# Patient Record
Sex: Female | Born: 1935 | Race: White | Hispanic: No | Marital: Married | Smoking: Never smoker
Health system: Southern US, Community
[De-identification: ages and names within clinical notes are randomized; demographics above are authoritative.]

## PROBLEM LIST (undated history)

## (undated) DIAGNOSIS — N200 Calculus of kidney: Secondary | ICD-10-CM

## (undated) DIAGNOSIS — K5790 Diverticulosis of intestine, part unspecified, without perforation or abscess without bleeding: Secondary | ICD-10-CM

## (undated) DIAGNOSIS — E785 Hyperlipidemia, unspecified: Secondary | ICD-10-CM

## (undated) DIAGNOSIS — M1990 Unspecified osteoarthritis, unspecified site: Secondary | ICD-10-CM

## (undated) DIAGNOSIS — I10 Essential (primary) hypertension: Secondary | ICD-10-CM

## (undated) DIAGNOSIS — R011 Cardiac murmur, unspecified: Secondary | ICD-10-CM

## (undated) DIAGNOSIS — I2510 Atherosclerotic heart disease of native coronary artery without angina pectoris: Secondary | ICD-10-CM

## (undated) DIAGNOSIS — T7840XA Allergy, unspecified, initial encounter: Secondary | ICD-10-CM

## (undated) DIAGNOSIS — K635 Polyp of colon: Secondary | ICD-10-CM

## (undated) DIAGNOSIS — C44519 Basal cell carcinoma of skin of other part of trunk: Secondary | ICD-10-CM

## (undated) HISTORY — DX: Diverticulosis of intestine, part unspecified, without perforation or abscess without bleeding: K57.90

## (undated) HISTORY — PX: OTHER SURGICAL HISTORY: SHX169

## (undated) HISTORY — DX: Unspecified osteoarthritis, unspecified site: M19.90

## (undated) HISTORY — PX: CARDIAC CATHETERIZATION: SHX172

## (undated) HISTORY — DX: Hyperlipidemia, unspecified: E78.5

## (undated) HISTORY — DX: Allergy, unspecified, initial encounter: T78.40XA

## (undated) HISTORY — DX: Essential (primary) hypertension: I10

## (undated) HISTORY — DX: Polyp of colon: K63.5

## (undated) HISTORY — PX: EYE SURGERY: SHX253

## (undated) HISTORY — DX: Calculus of kidney: N20.0

## (undated) HISTORY — DX: Basal cell carcinoma of skin of other part of trunk: C44.519

## (undated) HISTORY — PX: TONSILLECTOMY AND ADENOIDECTOMY: SUR1326

## (undated) HISTORY — DX: Cardiac murmur, unspecified: R01.1

## (undated) HISTORY — PX: DILATION AND CURETTAGE OF UTERUS: SHX78

---

## 1960-08-22 DIAGNOSIS — N200 Calculus of kidney: Secondary | ICD-10-CM

## 1960-08-22 HISTORY — DX: Calculus of kidney: N20.0

## 1999-10-29 ENCOUNTER — Other Ambulatory Visit: Admission: RE | Admit: 1999-10-29 | Discharge: 1999-10-29 | Payer: Self-pay | Admitting: Obstetrics & Gynecology

## 2000-12-20 ENCOUNTER — Other Ambulatory Visit: Admission: RE | Admit: 2000-12-20 | Discharge: 2000-12-20 | Payer: Self-pay | Admitting: Obstetrics & Gynecology

## 2002-04-05 ENCOUNTER — Other Ambulatory Visit: Admission: RE | Admit: 2002-04-05 | Discharge: 2002-04-05 | Payer: Self-pay | Admitting: Obstetrics & Gynecology

## 2002-08-22 HISTORY — PX: OTHER SURGICAL HISTORY: SHX169

## 2003-12-25 ENCOUNTER — Encounter: Admission: RE | Admit: 2003-12-25 | Discharge: 2003-12-25 | Payer: Self-pay | Admitting: Internal Medicine

## 2004-01-13 ENCOUNTER — Encounter: Admission: RE | Admit: 2004-01-13 | Discharge: 2004-01-13 | Payer: Self-pay | Admitting: Internal Medicine

## 2004-06-28 ENCOUNTER — Other Ambulatory Visit: Admission: RE | Admit: 2004-06-28 | Discharge: 2004-06-28 | Payer: Self-pay | Admitting: Obstetrics & Gynecology

## 2004-09-24 ENCOUNTER — Ambulatory Visit: Payer: Self-pay | Admitting: Internal Medicine

## 2004-11-22 ENCOUNTER — Ambulatory Visit: Payer: Self-pay | Admitting: Internal Medicine

## 2004-12-10 ENCOUNTER — Ambulatory Visit: Payer: Self-pay | Admitting: Internal Medicine

## 2005-07-04 ENCOUNTER — Other Ambulatory Visit: Admission: RE | Admit: 2005-07-04 | Discharge: 2005-07-04 | Payer: Self-pay | Admitting: Obstetrics & Gynecology

## 2005-11-14 ENCOUNTER — Ambulatory Visit: Payer: Self-pay | Admitting: Internal Medicine

## 2005-12-08 ENCOUNTER — Ambulatory Visit: Payer: Self-pay | Admitting: Internal Medicine

## 2006-01-05 ENCOUNTER — Ambulatory Visit: Payer: Self-pay | Admitting: Family Medicine

## 2006-02-09 ENCOUNTER — Ambulatory Visit: Payer: Self-pay | Admitting: Internal Medicine

## 2006-03-14 ENCOUNTER — Ambulatory Visit: Payer: Self-pay | Admitting: Internal Medicine

## 2006-06-29 ENCOUNTER — Ambulatory Visit: Payer: Self-pay | Admitting: Internal Medicine

## 2007-01-31 ENCOUNTER — Ambulatory Visit: Payer: Self-pay | Admitting: Internal Medicine

## 2007-02-15 ENCOUNTER — Ambulatory Visit: Payer: Self-pay | Admitting: Internal Medicine

## 2007-04-05 ENCOUNTER — Telehealth: Payer: Self-pay | Admitting: Internal Medicine

## 2007-04-20 ENCOUNTER — Ambulatory Visit: Payer: Self-pay | Admitting: Internal Medicine

## 2007-04-23 LAB — CONVERTED CEMR LAB
ALT: 22 units/L (ref 0–35)
Cholesterol: 253 mg/dL (ref 0–200)
Creatinine, Ser: 0.5 mg/dL (ref 0.4–1.2)
Direct LDL: 165.3 mg/dL
HDL: 64.2 mg/dL (ref >39.0)
Triglycerides: 158 mg/dL — ABNORMAL HIGH (ref 0–149)

## 2007-04-24 ENCOUNTER — Encounter (INDEPENDENT_AMBULATORY_CARE_PROVIDER_SITE_OTHER): Payer: Self-pay | Admitting: *Deleted

## 2007-05-03 ENCOUNTER — Ambulatory Visit: Payer: Self-pay | Admitting: Internal Medicine

## 2007-05-03 LAB — CONVERTED CEMR LAB
Cholesterol, target level: 200 mg/dL
LDL Goal: 160 mg/dL

## 2007-05-11 ENCOUNTER — Telehealth (INDEPENDENT_AMBULATORY_CARE_PROVIDER_SITE_OTHER): Payer: Self-pay | Admitting: *Deleted

## 2007-05-22 ENCOUNTER — Telehealth: Payer: Self-pay | Admitting: Internal Medicine

## 2007-07-27 ENCOUNTER — Ambulatory Visit: Payer: Self-pay | Admitting: Internal Medicine

## 2007-07-27 DIAGNOSIS — I10 Essential (primary) hypertension: Secondary | ICD-10-CM | POA: Insufficient documentation

## 2007-07-27 DIAGNOSIS — E785 Hyperlipidemia, unspecified: Secondary | ICD-10-CM | POA: Insufficient documentation

## 2007-07-27 DIAGNOSIS — M810 Age-related osteoporosis without current pathological fracture: Secondary | ICD-10-CM | POA: Insufficient documentation

## 2007-08-31 ENCOUNTER — Telehealth (INDEPENDENT_AMBULATORY_CARE_PROVIDER_SITE_OTHER): Payer: Self-pay | Admitting: *Deleted

## 2008-03-14 ENCOUNTER — Ambulatory Visit: Payer: Self-pay | Admitting: Internal Medicine

## 2008-08-27 ENCOUNTER — Ambulatory Visit: Payer: Self-pay | Admitting: Internal Medicine

## 2008-09-25 ENCOUNTER — Telehealth (INDEPENDENT_AMBULATORY_CARE_PROVIDER_SITE_OTHER): Payer: Self-pay | Admitting: *Deleted

## 2008-10-16 ENCOUNTER — Ambulatory Visit: Payer: Self-pay | Admitting: Internal Medicine

## 2008-10-16 ENCOUNTER — Encounter: Payer: Self-pay | Admitting: Internal Medicine

## 2008-11-13 ENCOUNTER — Encounter (INDEPENDENT_AMBULATORY_CARE_PROVIDER_SITE_OTHER): Payer: Self-pay | Admitting: *Deleted

## 2008-11-19 ENCOUNTER — Encounter (INDEPENDENT_AMBULATORY_CARE_PROVIDER_SITE_OTHER): Payer: Self-pay | Admitting: *Deleted

## 2008-11-19 ENCOUNTER — Telehealth (INDEPENDENT_AMBULATORY_CARE_PROVIDER_SITE_OTHER): Payer: Self-pay | Admitting: *Deleted

## 2008-12-09 ENCOUNTER — Ambulatory Visit: Payer: Self-pay | Admitting: Internal Medicine

## 2008-12-11 ENCOUNTER — Ambulatory Visit: Payer: Self-pay | Admitting: Internal Medicine

## 2008-12-12 ENCOUNTER — Encounter (INDEPENDENT_AMBULATORY_CARE_PROVIDER_SITE_OTHER): Payer: Self-pay | Admitting: *Deleted

## 2008-12-12 LAB — CONVERTED CEMR LAB
Magnesium: 2.2 mg/dL (ref 1.5–2.5)
Phosphorus: 4 mg/dL (ref 2.3–4.6)

## 2008-12-15 ENCOUNTER — Encounter (INDEPENDENT_AMBULATORY_CARE_PROVIDER_SITE_OTHER): Payer: Self-pay | Admitting: *Deleted

## 2008-12-17 ENCOUNTER — Telehealth (INDEPENDENT_AMBULATORY_CARE_PROVIDER_SITE_OTHER): Payer: Self-pay | Admitting: *Deleted

## 2009-01-20 ENCOUNTER — Ambulatory Visit: Payer: Self-pay | Admitting: Internal Medicine

## 2009-01-20 LAB — CONVERTED CEMR LAB
Calcium: 8.9 mg/dL (ref 8.4–10.5)
Creatinine, Ser: 0.8 mg/dL (ref 0.4–1.2)

## 2009-01-21 ENCOUNTER — Encounter (INDEPENDENT_AMBULATORY_CARE_PROVIDER_SITE_OTHER): Payer: Self-pay | Admitting: *Deleted

## 2009-02-10 ENCOUNTER — Encounter: Payer: Self-pay | Admitting: Internal Medicine

## 2009-02-10 ENCOUNTER — Ambulatory Visit (HOSPITAL_COMMUNITY): Admission: RE | Admit: 2009-02-10 | Discharge: 2009-02-10 | Payer: Self-pay | Admitting: Internal Medicine

## 2009-05-26 ENCOUNTER — Ambulatory Visit: Payer: Self-pay | Admitting: Internal Medicine

## 2009-05-26 ENCOUNTER — Encounter: Admission: RE | Admit: 2009-05-26 | Discharge: 2009-05-26 | Payer: Self-pay | Admitting: Family Medicine

## 2010-01-19 ENCOUNTER — Ambulatory Visit: Payer: Self-pay | Admitting: Internal Medicine

## 2010-01-19 DIAGNOSIS — D126 Benign neoplasm of colon, unspecified: Secondary | ICD-10-CM

## 2010-01-19 DIAGNOSIS — J309 Allergic rhinitis, unspecified: Secondary | ICD-10-CM | POA: Insufficient documentation

## 2010-01-19 DIAGNOSIS — Z85828 Personal history of other malignant neoplasm of skin: Secondary | ICD-10-CM

## 2010-01-19 DIAGNOSIS — M1990 Unspecified osteoarthritis, unspecified site: Secondary | ICD-10-CM | POA: Insufficient documentation

## 2010-01-21 ENCOUNTER — Telehealth (INDEPENDENT_AMBULATORY_CARE_PROVIDER_SITE_OTHER): Payer: Self-pay | Admitting: *Deleted

## 2010-04-01 ENCOUNTER — Ambulatory Visit: Payer: Self-pay | Admitting: Internal Medicine

## 2010-04-05 LAB — CONVERTED CEMR LAB
Albumin: 4.1 g/dL (ref 3.5–5.2)
HDL: 62.6 mg/dL (ref >39.00)
Total Protein: 6.7 g/dL (ref 6.0–8.3)
Triglycerides: 76 mg/dL (ref 0.0–149.0)
VLDL: 15.2 mg/dL (ref 0.0–40.0)

## 2010-05-19 ENCOUNTER — Ambulatory Visit: Payer: Self-pay | Admitting: Internal Medicine

## 2010-09-19 LAB — CONVERTED CEMR LAB
AST: 21 units/L (ref 0–37)
Alkaline Phosphatase: 54 units/L (ref 39–117)
Basophils Absolute: 0 10*3/uL (ref 0.0–0.1)
Bilirubin, Direct: 0.1 mg/dL (ref 0.0–0.3)
Chloride: 98 meq/L (ref 96–112)
Creatinine, Ser: 0.7 mg/dL (ref 0.4–1.2)
Direct LDL: 174.1 mg/dL
Eosinophils Absolute: 0.1 10*3/uL (ref 0.0–0.7)
Eosinophils Relative: 1.9 % (ref 0.0–5.0)
Glucose, Bld: 96 mg/dL (ref 70–99)
HCT: 41.7 % (ref 36.0–46.0)
Hemoglobin: 14.3 g/dL (ref 12.0–15.0)
Platelets: 300 10*3/uL (ref 150.0–400.0)
Potassium: 3.9 meq/L (ref 3.5–5.1)
RDW: 12.7 % (ref 11.5–14.6)
TSH: 1.62 microintl units/mL (ref 0.35–5.50)
Total Bilirubin: 0.5 mg/dL (ref 0.3–1.2)
Triglycerides: 151 mg/dL — ABNORMAL HIGH (ref 0.0–149.0)
Vit D, 25-Hydroxy: 36 ng/mL (ref 30–89)

## 2010-09-21 NOTE — Assessment & Plan Note (Signed)
Summary: f/u med, fasting labs//fd   Vital Signs:  Patient profile:   77 year old female Weight:      123.8 pounds Pulse rate:   72 / minute Resp:     14 per minute BP sitting:   130 / 86  (left arm) Cuff size:   large  Vitals Entered By: Shonna Chock (Jan 19, 2010 8:03 AM) CC: Follow-up visit: Renew Meds (stopped Lipitor) and fasting for labs , Hypertension Management, Lipid Management Comments REVIEWED MED LIST, PATIENT AGREED DOSE AND INSTRUCTION CORRECT    CC:  Follow-up visit: Renew Meds (stopped Lipitor) and fasting for labs , Hypertension Management, and Lipid Management.  History of Present Illness: Renee Figueroa is here for med refills.BP range: 124/76-137/74. NMR reviewed & risks discussed.She  stopped Lipitor 6 months ago; " I was having joint pains". She restricts fried foods; CVE as walking 5X/week 2.5 miles over  50 min w/o C-P symptoms. She does have some knee stiffness after 2.5 miles.She is having chronic rhinitis which has responded to the Neti pot. She was seen [facility] 01/05/2010 for URI, responsive to a  Zpack.  Hypertension History:      She denies headache, chest pain, palpitations, dyspnea with exertion, orthopnea, PND, peripheral edema, visual symptoms, neurologic problems, syncope, and side effects from treatment.        Positive major cardiovascular risk factors include female age 30 years old or older, hyperlipidemia, hypertension, and family history for ischemic heart disease (males less than 21 years old).  Negative major cardiovascular risk factors include no history of diabetes and non-tobacco-user status.        Further assessment for target organ damage reveals no history of ASHD, stroke/TIA, or peripheral vascular disease.    Lipid Management History:      Positive NCEP/ATP III risk factors include female age 32 years old or older, early menopause without estrogen hormone replacement, family history for ischemic heart disease (males less than 76 years  old), and hypertension.  Negative NCEP/ATP III risk factors include non-diabetic, HDL cholesterol greater than 60, non-tobacco-user status, no ASHD (atherosclerotic heart disease), no prior stroke/TIA, no peripheral vascular disease, and no history of aortic aneurysm.      Allergies (verified): 1)  ! Lipitor  Past History:  Past Medical History: Hyperlipidemia: [place] LDL goal = < 160. NMR Lipoprofile 2006: LDL 175(2014/893),HDL 69,TG 108.LDL goal = < 120, ideally < 90. Osteoporosis: T score -3.5 @ spine, -2.3 @ femoral neck in 09/2008; S/P Reclast 01/2009. Vitamin D level 35 in 2010. Hypertension  Skin cancer, hx of, Basal Cell of chest  Past Surgical History: renal calculi @ age 71 D and C 2 pregnancies and 2 live births Colonoscopy : colon polyps adenomatous polyp ; negative F/U 2007  Review of Systems General:  Denies chills, fever, and sweats. Eyes:  Denies blurring, double vision, and vision loss-both eyes. ENT:  Complains of postnasal drainage; denies difficulty swallowing, hoarseness, and nasal congestion; No frontal headache , facial pain or purulence. CV:  Denies leg cramps with exertion. GI:  Denies abdominal pain, bloody stools, and dark tarry stools; Occasional diet induced dyspepsia; as needed TUMS help. MS:  Complains of joint pain and low back pain; denies joint redness, joint swelling, mid back pain, and thoracic pain; S/P Effexxa(rooster comb)  knee injections 05/2009  by Dr Prince Rome with good response. Derm:  Denies lesion(s) and rash. Neuro:  Denies disturbances in coordination, numbness, poor balance, and tingling.  Physical Exam  General:  Appears younger  than age,well-nourished; alert,appropriate and cooperative throughout examination Neck:  No deformities, masses, or tenderness noted. Lungs:  Normal respiratory effort, chest expands symmetrically. Lungs are clear to auscultation, no crackles or wheezes. Heart:  normal rate, regular rhythm, no gallop, no  rub, no JVD, no HJR, and grade 1.5 systolic murmur with L neck radiation.   Abdomen:  Bowel sounds positive,abdomen soft and non-tender without masses, organomegaly or hernias noted. Pulses:  R and L carotid,radial,dorsalis pedis and posterior tibial pulses are full and equal bilaterally Extremities:  No clubbing, cyanosis, edema. OA hand changes Neurologic:  alert & oriented X3 and DTRs symmetrical and normal.   Skin:  Intact without suspicious lesions or rashes Cervical Nodes:  No lymphadenopathy noted Axillary Nodes:  No palpable lymphadenopathy Psych:  memory intact for recent and remote, normally interactive, and good eye contact.     Impression & Recommendations:  Problem # 1:  HYPERTENSION, ESSENTIAL NOS (ICD-401.9)  controlled Her updated medication list for this problem includes:    Norvasc 5 Mg Tabs (Amlodipine besylate) .Marland Kitchen... 1 by mouth qd    Hydrochlorothiazide 25 Mg Tabs (Hydrochlorothiazide) .Marland Kitchen... 1/2 tab qd  Orders: EKG w/ Interpretation (93000) Venipuncture (21308) TLB-BMP (Basic Metabolic Panel-BMET) (80048-METABOL) Prescription Created Electronically [phone])  Problem # 2:  HYPERLIPIDEMIA (ICD-272.4) Statin D/Ced by her  due to joint pain ; lack of joint pain relationship to statins  & potential risk of muscle injury @ high doses of statins discussed. The following medications were removed from the medication list:    Lipitor 20 Mg Tabs (Atorvastatin calcium) .Marland Kitchen... 1qd  Orders: Venipuncture (69629) TLB-Hepatic/Liver Function Pnl (80076-HEPATIC) TLB-TSH (Thyroid Stimulating Hormone) (84443-TSH) TLB-Lipid Panel (80061-LIPID)  Problem # 3:  OSTEOPOROSIS (ICD-733.00)  Her updated medication list for this problem includes:    Reclast 5 Mg/125ml Soln (Zoledronic acid) ..... Once yearly  Orders: Venipuncture (52841) TLB-BMP (Basic Metabolic Panel-BMET) (80048-METABOL) TLB-TSH (Thyroid Stimulating Hormone) (84443-TSH) T-Vitamin D (25-Hydroxy)  (32440-10272)  Problem # 4:  COLONIC POLYPS, ADENOMATOUS (ICD-211.3)  F/U as per GI  Orders: Venipuncture (53664) TLB-CBC Platelet - w/Differential (85025-CBCD)  Problem # 5:  RHINITIS (ICD-477.9)  Her updated medication list for this problem includes:    Fluticasone Propionate 50 Mcg/act Susp (Fluticasone propionate) .Marland Kitchen... 1 spray two times a day prn  Orders: Prescription Created Electronically [phone])  Problem # 6:  DEGENERATIVE JOINT DISEASE (ICD-715.90) as per Dr Prince Rome  Complete Medication List: 1)  Norvasc 5 Mg Tabs (Amlodipine besylate) .Marland Kitchen.. 1 by mouth qd 2)  Hydrochlorothiazide 25 Mg Tabs (Hydrochlorothiazide) .... 1/2 tab qd 3)  Calcium 1200mg  Vit D 200  .Marland KitchenMarland Kitchen. 1 by mouth two times a day 4)  Fish Oil 1200 Mg Caps (Omega-3 fatty acids) .... 2 by mouth once daily 5)  Mvi  .Marland Kitchen.. 1 by mouth once daily 6)  Reclast 5 Mg/177ml Soln (Zoledronic acid) .... Once yearly 7)  Fluticasone Propionate 50 Mcg/act Susp (Fluticasone propionate) .Marland Kitchen.. 1 spray two times a day prn  Hypertension Assessment/Plan:      The patient's hypertensive risk group is category B: At least one risk factor (excluding diabetes) with no target organ damage.  Today's blood pressure is 130/86.    Lipid Assessment/Plan:      Based on NCEP/ATP III, the patient's risk factor category is "0-1 risk factors".  The patient's lipid goals are as follows: Total cholesterol goal is 200; LDL cholesterol goal is 120; HDL cholesterol goal is 40; Triglyceride goal is 150.    Patient Instructions: 1)  Neti pot  once daily as needed for congestion.Take an Aspirin every day due to high lipids & family history. Prescriptions: FLUTICASONE PROPIONATE 50 MCG/ACT SUSP (FLUTICASONE PROPIONATE) 1 spray two times a day prn  #1 x 11   Entered and Authorized by:   Marga Melnick MD   Signed by:   Marga Melnick MD on 01/19/2010   Method used:   Faxed to ...       [facility] [phone]* (retail)       [address] [place], [place]  [address]       Ph: [phone]       Fax: [phone]   RxID:   [id number] HYDROCHLOROTHIAZIDE 25 MG  TABS (HYDROCHLOROTHIAZIDE) 1/2 tab qd  #100 Tablet x 0   Entered and Authorized by:   Marga Melnick MD   Signed by:   Marga Melnick MD on 01/19/2010   Method used:   Faxed to ...       [facility] [phone]* (retail)       [address] [place], [place]  [address]       Ph: [phone]       Fax: [phone]   RxID:   [id number] NORVASC 5 MG  TABS (AMLODIPINE BESYLATE) 1 by mouth qd  #90 x 3   Entered and Authorized by:   Marga Melnick MD   Signed by:   Marga Melnick MD on 01/19/2010   Method used:   Faxed to ...       [facility] [phone]* (retail)       [address] [place], [place]  [address]       Ph: [phone]       Fax: [phone]   RxID:   [id number]

## 2010-09-21 NOTE — Assessment & Plan Note (Signed)
Summary: FLU SHOT//PH   Nurse Visit   Allergies: 1)  ! Lipitor  Orders Added: 1)  Admin 1st Vaccine [90471] 2)  Flu Vaccine 21yrs + [16109] Flu Vaccine Consent Questions     Do you have a history of severe allergic reactions to this vaccine? no    Any prior history of allergic reactions to egg and/or gelatin? no    Do you have a sensitivity to the preservative Thimersol? no    Do you have a past history of Guillan-Barre Syndrome? no    Do you currently have an acute febrile illness? no    Have you ever had a severe reaction to latex? no    Vaccine information given and explained to patient? yes    Are you currently pregnant? no    Lot Number:[id number]   Exp Date:02/19/2011   Site Given  Left Deltoid IM

## 2010-09-21 NOTE — Progress Notes (Signed)
Summary: Start Lipitor/ Lab Appointment  Phone Note Call from Patient Call back at Work Phone [phone]   Caller: Patient Summary of Call: Patient called to say she is ready to restart the Lipitor. Her pharmacy is [facility] [address]. Please call if you have any questions.  Initial call taken by: Harold Barban,  January 21, 2010 11:17 AM  Follow-up for Phone Call        Try low dose ; Lipitor 10 mg once daily #90. Recheck fasting labs after 10 weeks (lipids, hepatic panel, CPK; 272.4,995.20) Follow-up by: Marga Melnick MD,  January 21, 2010 2:52 PM  Additional Follow-up for Phone Call Additional follow up Details #1::        Please contact patient to set up labs, 10 weeks after starting 1st dose Additional Follow-up by: Shonna Chock,  January 21, 2010 4:32 PM    Additional Follow-up for Phone Call Additional follow up Details #2::    Patient is going to pick up rx this evening and will be coming in for blood work on 8.11.11 Follow-up by: Harold Barban,  January 22, 2010 9:28 AM  New/Updated Medications: LIPITOR 10 MG TABS (ATORVASTATIN CALCIUM) 1 by mouth at bedtime Prescriptions: LIPITOR 10 MG TABS (ATORVASTATIN CALCIUM) 1 by mouth at bedtime  #90 x 0   Entered by:   Shonna Chock   Authorized by:   Marga Melnick MD   Signed by:   Shonna Chock on 01/21/2010   Method used:   Electronically to        [id number]* (retail)       [address] [place], [place]  [address]       Ph: [phone]       Fax: [phone]   RxID:   [id number]

## 2011-01-07 ENCOUNTER — Other Ambulatory Visit: Payer: Self-pay | Admitting: Internal Medicine

## 2011-03-22 ENCOUNTER — Other Ambulatory Visit: Payer: Self-pay | Admitting: Internal Medicine

## 2011-04-06 ENCOUNTER — Ambulatory Visit: Payer: Self-pay | Admitting: Internal Medicine

## 2011-04-11 ENCOUNTER — Other Ambulatory Visit: Payer: Self-pay | Admitting: Internal Medicine

## 2011-05-16 ENCOUNTER — Encounter: Payer: Self-pay | Admitting: Internal Medicine

## 2011-05-17 ENCOUNTER — Ambulatory Visit (INDEPENDENT_AMBULATORY_CARE_PROVIDER_SITE_OTHER): Payer: Medicare Other | Admitting: Family Medicine

## 2011-05-17 ENCOUNTER — Encounter: Payer: Self-pay | Admitting: Family Medicine

## 2011-05-17 VITALS — BP 180/100 | Temp 98.0°F | Wt 123.0 lb

## 2011-05-17 DIAGNOSIS — B029 Zoster without complications: Secondary | ICD-10-CM

## 2011-05-17 MED ORDER — VALACYCLOVIR HCL 1 G PO TABS: 1000.0000 mg | ORAL_TABLET | Freq: Three times a day (TID) | ORAL | Status: DC

## 2011-05-17 NOTE — Assessment & Plan Note (Signed)
Rash is consistent w/ shingles.  Start Valtrex.  Reviewed supportive care and red flags that should prompt return.  Pt expressed understanding and is in agreement w/ plan.

## 2011-05-17 NOTE — Patient Instructions (Signed)
This is shingles Take the Valtrex as directed Keep the area covered Try and avoid pregnant women Call with any questions or concerns Hang in there!

## 2011-05-17 NOTE — Progress Notes (Signed)
  Subjective:    Patient ID: Renee Figueroa, female    DOB: 1933-11-04, 77 y.o.   MRN: [id number]  HPI Rash- sxs started yesterday.  Had pain preceeding rash.  Rash is not itchy.  Pain has improved since rash appeared.   Review of Systems For ROS see HPI     Objective:   Physical Exam  Vitals reviewed. Constitutional: She appears well-developed and well-nourished. No distress.  Skin: Skin is warm and dry. Rash (vesicular rash consistent w/ shingles in L C6 dermatome) noted.          Assessment & Plan:

## 2011-05-25 ENCOUNTER — Ambulatory Visit (INDEPENDENT_AMBULATORY_CARE_PROVIDER_SITE_OTHER): Payer: Medicare Other | Admitting: Internal Medicine

## 2011-05-25 ENCOUNTER — Encounter: Payer: Self-pay | Admitting: Internal Medicine

## 2011-05-25 DIAGNOSIS — B029 Zoster without complications: Secondary | ICD-10-CM

## 2011-05-25 DIAGNOSIS — M542 Cervicalgia: Secondary | ICD-10-CM

## 2011-05-25 MED ORDER — TRAMADOL HCL 50 MG PO TABS: 50.0000 mg | ORAL_TABLET | Freq: Four times a day (QID) | ORAL | Status: DC | PRN

## 2011-05-25 MED ORDER — CYCLOBENZAPRINE HCL 5 MG PO TABS: 5.0000 mg | ORAL_TABLET | ORAL | Status: DC

## 2011-05-25 NOTE — Progress Notes (Signed)
Subjective:    Patient ID: Renee Figueroa, female    DOB: 24-Feb-1934, 77 y.o.   MRN: [id number]  HPI NECK PAIN: Location: inferior occiput   Onset: this am approximately 1 am  Severity: up to 10 Pain is described as: aching pain  Worse with: supine position    Better with: ice & Tylenol  Pain radiates to: no   Impaired range of motion: stiff but improving  History of repetitive motion:  no,    History of overuse or hyperextension:  no  History of trauma:  no   Past history of similar problem:  no   Symptoms Back Pain:  no  Numbness/tingling:  no  Weakness:  no  Red Flags Fever:  no  Headache:  no  Bowel/bladder dysfunction:  no  She was concerned about possible meningitis; her husband questioned  relationship to the  herpes zoster for which she received antiviral      Review of Systems she has  completed a course of anti viral  for C6 zoster in the L  upper in the left upper extremity.       Objective:   Physical Exam Gen.: Healthy and well-nourished in appearance. Alert, appropriate and cooperative throughout exam. She is in no acute distress but uncomfortable due to the pain in the neck Head: Normocephalic without obvious abnormalities  Eyes: No corneal or conjunctival inflammation noted. Pupils equal round reactive to light and accommodation. FOV WNL. Extraocular motion intact. Vision grossly normal. Ears: External  ear exam reveals no significant lesions or deformities.TMs normal Nose: External nasal exam reveals no deformity or inflammation. Nasal mucosa are pink and moist . No lesions or exudates noted.   Mouth: Oral mucosa and oropharynx reveal no lesions or exudates. Teeth in good repair. Neck: No deformities, masses, or tenderness noted. Range of motion  Decreased du to pain. Thyroid normal. The neck is supple to passive range of motion but again she experiences discomfort. There is no meningismus. Lungs: Normal respiratory effort; chest expands symmetrically.  Lungs are clear to auscultation without rales, wheezes, or increased work of breathing. Heart: Normal rate and rhythm. Normal S1 and S2. No gallop, click, or rub. Grade 1/6 systolic  murmur.                                                                                    Musculoskeletal/extremities: Decreased height of  thoracic  spine. No clubbing, cyanosis, edema noted. Range of motion  normal .Tone & strength  Normal.Joints:mixed arthritic changes.Nail health  Good. Straight leg raising is negative.  Vascular: Carotid, radial artery pulses are full and equal.  Neurologic: Alert and oriented x3. Deep tendon reflexes symmetrical and normal. Gait, Romberg, and finger to nose testing is normal. Cranial nerve exam is unremarkable.         Skin: Intact without suspicious lesions. There is evidence of resolving C6 zoster in the left arm. Lymph: No cervical, axillary lymphadenopathy present. Psych: Mood and affect are normal. Normally interactive  Assessment & Plan:  #1 cervical myalgia ; no clinical evidence of meningismus  #2 herpes zoster, C6 left upper extremity resolving. This is unrelated to the neck symptoms  Plan: See orders and recommendations

## 2011-05-25 NOTE — Patient Instructions (Signed)
Use a cervical memory foam pillow to prevent hyperextension or hyperflexion of the cervical spine.  Use warm moist compresses 3 times a day as discussed.

## 2011-06-07 ENCOUNTER — Other Ambulatory Visit: Payer: Self-pay | Admitting: Internal Medicine

## 2011-06-09 ENCOUNTER — Ambulatory Visit (INDEPENDENT_AMBULATORY_CARE_PROVIDER_SITE_OTHER): Payer: Medicare Other

## 2011-06-09 DIAGNOSIS — Z23 Encounter for immunization: Secondary | ICD-10-CM

## 2011-06-28 ENCOUNTER — Ambulatory Visit (INDEPENDENT_AMBULATORY_CARE_PROVIDER_SITE_OTHER): Payer: Medicare Other | Admitting: Internal Medicine

## 2011-06-28 ENCOUNTER — Encounter: Payer: Self-pay | Admitting: Internal Medicine

## 2011-06-28 VITALS — BP 160/92 | HR 80 | Temp 98.4°F | Ht 59.0 in | Wt 121.0 lb

## 2011-06-28 DIAGNOSIS — E785 Hyperlipidemia, unspecified: Secondary | ICD-10-CM

## 2011-06-28 DIAGNOSIS — D126 Benign neoplasm of colon, unspecified: Secondary | ICD-10-CM

## 2011-06-28 DIAGNOSIS — I10 Essential (primary) hypertension: Secondary | ICD-10-CM

## 2011-06-28 DIAGNOSIS — Z0000 Encounter for general adult medical examination without abnormal findings: Secondary | ICD-10-CM

## 2011-06-28 DIAGNOSIS — M810 Age-related osteoporosis without current pathological fracture: Secondary | ICD-10-CM

## 2011-06-28 MED ORDER — AMLODIPINE BESYLATE 5 MG PO TABS: 10.0000 mg | ORAL_TABLET | Freq: Every day | ORAL | Status: DC

## 2011-06-28 NOTE — Progress Notes (Signed)
Subjective:    Patient ID: Renee Figueroa, female    DOB: 07-Sep-1933, 77 y.o.   MRN: [id number]  HPI Medicare Wellness Visit:  The following psychosocial & medical history were reviewed as required by Medicare.   Social history: caffeine: 1 cup / day of coffee , alcohol:  no ,  tobacco use : never  & exercise : walking 3 mpd X 4 days.   Home & personal  safety / fall risk: no issues, activities of daily living: no limitations , seatbelt use : yes , and smoke alarm employment : yes .  Power of Attorney/Living Will status : in place  Vision ( as recorded per Nurse) & Hearing  evaluation :  Ophth seen 4/12 , no issues.  Decreased hearing to whisper @ 6 ft Orientation :oriented X 3 , memory & recall :good, spelling  testing: good,and mood & affect : normal . Depression / anxiety: anxiety due to worry about adult children Travel history : [place] 2004 , immunization status :Shingles will be needed , transfusion history:  no, and preventive health surveillance ( colonoscopies, BMD , etc as per protocol/ [facility]): colonoscopy up to date, Dental care:  Seen every 6 months . Chart reviewed &  Updated. Active issues reviewed & addressed.    Review of Systems CHRONIC HYPERTENSION: Disease Monitoring  Blood pressure range: 119/65- 161/86  Chest pain: no   Dyspnea: no   Claudication: no  Medication compliance: yes  Medication Side Effects  Lightheadedness: no   Urinary frequency: no   Edema: no   Preventitive Healthcare:   Diet Pattern: no plan  Salt Restriction: no added salt       Objective:   Physical ExamGen.: Thin but healthy and well-nourished in appearance. Alert, appropriate and cooperative throughout exam. Head: Normocephalic without obvious abnormalities Eyes: No corneal or conjunctival inflammation noted. Extraocular motion intact. Vision grossly normal. Ears: External  ear exam reveals no significant lesions or deformities. Canals clear .TMs normal. Hearing is grossly normal  bilaterally. Nose: External nasal exam reveals no deformity or inflammation. Nasal mucosa are pink and moist. No lesions or exudates noted. Mouth: Oral mucosa and oropharynx reveal no lesions or exudates. Teeth in good repair. Neck: No deformities, masses, or tenderness noted. Range of motion &. Thyroid  normal. Lungs: Normal respiratory effort; chest expands symmetrically. Lungs are clear to auscultation without rales, wheezes, or increased work of breathing. Heart: Normal rate and rhythm. Normal S1 and S2. No gallop, click, or rub. Grade 1.5 systolic  murmur. Abdomen: Bowel sounds normal; abdomen soft and nontender. No masses, organomegaly or hernias noted. Genitalia: as per Gyn   .                                                                                   Musculoskeletal/extremities: No deformity or scoliosis noted of  the thoracic or lumbar spine. No clubbing, cyanosis, edema noted. Range of motion  normal .Tone & strength  normal.Joints : DJD of hands. Nail health  good. Vascular: Carotid, radial artery, dorsalis pedis and  posterior tibial pulses are full and equal. No bruits present. Neurologic: Alert and oriented x3. Deep tendon reflexes symmetrical and normal.  Skin: Intact without suspicious lesions or rashes. Post op granuloma LU back Lymph: No cervical, axillary  lymphadenopathy present. Psych: Mood and affect are normal. Normally interactive                                                                                         Assessment & Plan:  #1 Medicare Wellness Exam; criteria met ; data entered #2 Problem List reviewed ; Assessment/ Recommendations made Plan: see Orders

## 2011-06-28 NOTE — Patient Instructions (Addendum)
Preventive Health Care: Exercise  30-45  minutes a day, 3-4 days a week. Walking is especially valuable in preventing Osteoporosis. Eat a low-fat diet with lots of fruits and vegetables, up to 7-9 servings per day. Consume less than 30 grams of sugar per day from foods & drinks with High Fructose Corn Syrup as # 1,2,3 or #4 on label. Eye Doctor - have an eye exam @ least annually. Blood Pressure Goal  Ideally is an AVERAGE < 135/85. This AVERAGE should be calculated from @ least 5-7 BP readings taken @ different times of day on different days of week. You should not respond to isolated BP readings , but rather the AVERAGE for that week  Please  schedule fasting Labs : BMET,Lipids, hepatic panel, CBC & dif, TSH,vit D level.   Please bring these instructions to that Lab appt.

## 2011-07-05 ENCOUNTER — Other Ambulatory Visit: Payer: Self-pay | Admitting: Internal Medicine

## 2011-07-05 DIAGNOSIS — Z0000 Encounter for general adult medical examination without abnormal findings: Secondary | ICD-10-CM

## 2011-07-05 DIAGNOSIS — E785 Hyperlipidemia, unspecified: Secondary | ICD-10-CM

## 2011-07-05 DIAGNOSIS — I10 Essential (primary) hypertension: Secondary | ICD-10-CM

## 2011-07-06 ENCOUNTER — Other Ambulatory Visit (INDEPENDENT_AMBULATORY_CARE_PROVIDER_SITE_OTHER): Payer: Medicare Other

## 2011-07-06 DIAGNOSIS — Z79899 Other long term (current) drug therapy: Secondary | ICD-10-CM

## 2011-07-06 DIAGNOSIS — I10 Essential (primary) hypertension: Secondary | ICD-10-CM

## 2011-07-06 DIAGNOSIS — Z0000 Encounter for general adult medical examination without abnormal findings: Secondary | ICD-10-CM

## 2011-07-06 DIAGNOSIS — M810 Age-related osteoporosis without current pathological fracture: Secondary | ICD-10-CM

## 2011-07-06 DIAGNOSIS — E785 Hyperlipidemia, unspecified: Secondary | ICD-10-CM

## 2011-07-06 LAB — CBC WITH DIFFERENTIAL/PLATELET
Basophils Absolute: 0 10*3/uL (ref 0.0–0.1)
Basophils Relative: 0.2 % (ref 0.0–3.0)
Eosinophils Absolute: 0.1 10*3/uL (ref 0.0–0.7)
Eosinophils Relative: 2.2 % (ref 0.0–5.0)
HCT: 40.1 % (ref 36.0–46.0)
Hemoglobin: 13.6 g/dL (ref 12.0–15.0)
Lymphocytes Relative: 47.6 % — ABNORMAL HIGH (ref 12.0–46.0)
Lymphs Abs: 2.9 10*3/uL (ref 0.7–4.0)
MCHC: 33.8 g/dL (ref 30.0–36.0)
MCV: 92.8 fl (ref 78.0–100.0)
Monocytes Absolute: 0.5 10*3/uL (ref 0.1–1.0)
Monocytes Relative: 7.6 % (ref 3.0–12.0)
Neutro Abs: 2.6 10*3/uL (ref 1.4–7.7)
Neutrophils Relative %: 42.4 % — ABNORMAL LOW (ref 43.0–77.0)
Platelets: 277 10*3/uL (ref 150.0–400.0)
RBC: 4.33 Mil/uL (ref 3.87–5.11)
RDW: 13.6 % (ref 11.5–14.6)
WBC: 6 10*3/uL (ref 4.5–10.5)

## 2011-07-06 LAB — HEPATIC FUNCTION PANEL
ALT: 20 U/L (ref 0–35)
AST: 21 U/L (ref 0–37)
Albumin: 4 g/dL (ref 3.5–5.2)
Alkaline Phosphatase: 57 U/L (ref 39–117)
Bilirubin, Direct: 0.1 mg/dL (ref 0.0–0.3)
Total Bilirubin: 0.5 mg/dL (ref 0.3–1.2)
Total Protein: 7.2 g/dL (ref 6.0–8.3)

## 2011-07-06 LAB — BASIC METABOLIC PANEL
BUN: 14 mg/dL (ref 6–23)
CO2: 26 mEq/L (ref 19–32)
Calcium: 9.2 mg/dL (ref 8.4–10.5)
Chloride: 103 mEq/L (ref 96–112)
Creatinine, Ser: 0.8 mg/dL (ref 0.4–1.2)
GFR: 78.72 mL/min (ref >60.00)
Glucose, Bld: 84 mg/dL (ref 70–99)
Potassium: 3.8 mEq/L (ref 3.5–5.1)
Sodium: 138 mEq/L (ref 135–145)

## 2011-07-06 LAB — LIPID PANEL
Cholesterol: 172 mg/dL (ref 0–200)
HDL: 67.5 mg/dL (ref >39.00)
LDL Cholesterol: 89 mg/dL (ref 0–99)
Total CHOL/HDL Ratio: 3
Triglycerides: 76 mg/dL (ref 0.0–149.0)
VLDL: 15.2 mg/dL (ref 0.0–40.0)

## 2011-07-06 LAB — TSH: TSH: 1.5 u[IU]/mL (ref 0.35–5.50)

## 2011-07-06 NOTE — Progress Notes (Signed)
12  

## 2011-07-07 LAB — VITAMIN D 25 HYDROXY (VIT D DEFICIENCY, FRACTURES): Vit D, 25-Hydroxy: 50 ng/mL (ref 30–89)

## 2011-07-16 ENCOUNTER — Other Ambulatory Visit: Payer: Self-pay | Admitting: Internal Medicine

## 2011-07-19 ENCOUNTER — Telehealth: Payer: Self-pay | Admitting: Internal Medicine

## 2011-07-19 NOTE — Telephone Encounter (Signed)
Patient needs refill for hydrochlorithizide 90 day supply [facility]

## 2011-07-20 ENCOUNTER — Other Ambulatory Visit: Payer: Self-pay | Admitting: *Deleted

## 2011-07-20 MED ORDER — HYDROCHLOROTHIAZIDE 25 MG PO TABS: 12.5000 mg | ORAL_TABLET | Freq: Every day | ORAL | Status: DC

## 2011-07-20 NOTE — Telephone Encounter (Signed)
rx sent to pharmacy by e-script  

## 2011-07-20 NOTE — Telephone Encounter (Signed)
Sent medication for HCTZ earlier in the day via escript however [facility] states they do not have the rx on file. Pt came into the office to advise that she is almost out of the Amlodipine and wanted to know if she can have more as well as change from 2tabs at 5mg  to 1tab 10mg . Spoke to MD tabori to verfiy a verbal order for change in the Amlodipine medication. Physically called [facility] with a verbal order for HCTZ 25mg  take half tab (12.5mg ) 1 time daily #45 with 3 refills per pt has been seen in office on 06-28-11 and wanted the medication formulary switched then and MD advised he would switch the medication. Also gave verbal order for Amlodipine 10mg  tablets take 1tablet QD 90tablets with 3 refills. Advised pt the rx has been called into pharmacist Brysa at [facility]. Pt understood.

## 2011-07-22 NOTE — Telephone Encounter (Signed)
Rx was sent 07/20/11 by Tresa Moore    KP

## 2011-12-16 ENCOUNTER — Encounter: Payer: Self-pay | Admitting: Internal Medicine

## 2012-02-16 ENCOUNTER — Encounter: Payer: Medicare Other | Admitting: Internal Medicine

## 2012-02-27 ENCOUNTER — Ambulatory Visit (AMBULATORY_SURGERY_CENTER): Payer: Medicare Other | Admitting: *Deleted

## 2012-02-27 ENCOUNTER — Encounter: Payer: Self-pay | Admitting: Internal Medicine

## 2012-02-27 VITALS — Ht 59.0 in | Wt 124.0 lb

## 2012-02-27 DIAGNOSIS — Z1211 Encounter for screening for malignant neoplasm of colon: Secondary | ICD-10-CM

## 2012-02-27 MED ORDER — MOVIPREP 100 G PO SOLR: ORAL | Status: DC

## 2012-03-01 ENCOUNTER — Encounter: Payer: Self-pay | Admitting: Internal Medicine

## 2012-03-01 ENCOUNTER — Ambulatory Visit (AMBULATORY_SURGERY_CENTER): Payer: Medicare Other | Admitting: Internal Medicine

## 2012-03-01 VITALS — BP 154/86 | HR 101 | Temp 96.5°F | Resp 16 | Ht 59.0 in | Wt 124.0 lb

## 2012-03-01 DIAGNOSIS — R238 Other skin changes: Secondary | ICD-10-CM

## 2012-03-01 DIAGNOSIS — Z1211 Encounter for screening for malignant neoplasm of colon: Secondary | ICD-10-CM

## 2012-03-01 DIAGNOSIS — Z86010 Personal history of colonic polyps: Secondary | ICD-10-CM

## 2012-03-01 DIAGNOSIS — D126 Benign neoplasm of colon, unspecified: Secondary | ICD-10-CM

## 2012-03-01 MED ORDER — SODIUM CHLORIDE 0.9 % IV SOLN: 500.0000 mL | INTRAVENOUS | Status: DC

## 2012-03-01 NOTE — Patient Instructions (Addendum)
YOU HAD AN ENDOSCOPIC PROCEDURE TODAY AT THE [facility]: Refer to the procedure report that was given to you for any specific questions about what was found during the examination.  If the procedure report does not answer your questions, please call your gastroenterologist to clarify.  If you requested that your care partner not be given the details of your procedure findings, then the procedure report has been included in a sealed envelope for you to review at your convenience later.  YOU SHOULD EXPECT: Some feelings of bloating in the abdomen. Passage of more gas than usual.  Walking can help get rid of the air that was put into your GI tract during the procedure and reduce the bloating. If you had a lower endoscopy (such as a colonoscopy or flexible sigmoidoscopy) you may notice spotting of blood in your stool or on the toilet paper. If you underwent a bowel prep for your procedure, then you may not have a normal bowel movement for a few days.  DIET: Your first meal following the procedure should be a light meal and then it is ok to progress to your normal diet.  A half-sandwich or bowl of soup is an example of a good first meal.  Heavy or fried foods are harder to digest and may make you feel nauseous or bloated.  Likewise meals heavy in dairy and vegetables can cause extra gas to form and this can also increase the bloating.  Drink plenty of fluids but you should avoid alcoholic beverages for 24 hours.  ACTIVITY: Your care partner should take you home directly after the procedure.  You should plan to take it easy, moving slowly for the rest of the day.  You can resume normal activity the day after the procedure however you should NOT DRIVE or use heavy machinery for 24 hours (because of the sedation medicines used during the test).    SYMPTOMS TO REPORT IMMEDIATELY: A gastroenterologist can be reached at any hour.  During normal business hours, 8:30 AM to 5:00 PM Monday through Friday,  call [phone].  After hours and on weekends, please call the GI answering service at [phone] who will take a message and have the physician on call contact you.   Following lower endoscopy (colonoscopy or flexible sigmoidoscopy):  Excessive amounts of blood in the stool  Significant tenderness or worsening of abdominal pains  Swelling of the abdomen that is new, acute  Fever of 100F or higher  Following upper endoscopy (EGD)  Vomiting of blood or coffee ground material  New chest pain or pain under the shoulder blades  Painful or persistently difficult swallowing  New shortness of breath  Fever of 100F or higher  Black, tarry-looking stools  FOLLOW UP: If any biopsies were taken you will be contacted by phone or by letter within the next 1-3 weeks.  Call your gastroenterologist if you have not heard about the biopsies in 3 weeks.  Our staff will call the home number listed on your records the next business day following your procedure to check on you and address any questions or concerns that you may have at that time regarding the information given to you following your procedure. This is a courtesy call and so if there is no answer at the home number and we have not heard from you through the emergency physician on call, we will assume that you have returned to your regular daily activities without incident.  SIGNATURES/CONFIDENTIALITY: You and/or your care   partner have signed paperwork which will be entered into your electronic medical record.  These signatures attest to the fact that that the information above on your After Visit Summary has been reviewed and is understood.  Full responsibility of the confidentiality of this discharge information lies with you and/or your care-partner.   Handouts on polyps, diverticulosis, high fiber diet  

## 2012-03-01 NOTE — Op Note (Signed)
[facility] [address] [place], [place]  [address]  COLONOSCOPY PROCEDURE REPORT  PATIENT:  Renee Figueroa, Renee Figueroa  MR#:  [id number] BIRTHDATE:  1934-01-28, 78 yrs. old  GENDER:  female ENDOSCOPIST:  Wilhemina Bonito. Eda Keys, MD REF. BY:  Surveillance Program Recall, PROCEDURE DATE:  03/01/2012 PROCEDURE:  Colonoscopy with snare polypectomy x 1 ASA CLASS:  Class II INDICATIONS:  history of pre-cancerous (adenomatous) colon polyps, surveillance and high-risk screening ; index 2004 (TA); f/u 2008 negative MEDICATIONS:   MAC sedation, administered by CRNA, propofol (Diprivan) 270 mg IV  DESCRIPTION OF PROCEDURE:   After the risks benefits and alternatives of the procedure were thoroughly explained, informed consent was obtained.  Digital rectal exam was performed and revealed no abnormalities.   The LB CF-[id number] endoscope was introduced through the anus and advanced to the cecum, which was identified by both the appendix and ileocecal valve, without limitations.  The quality of the prep was excellent, using MoviPrep.  The instrument was then slowly withdrawn as the colon was fully examined. <<PROCEDUREIMAGES>>  FINDINGS:  A diminutive polyp was found in the sigmoid colon and snared without cautery. Retrieval was successful. Moderate diverticulosis was found in the sigmoid colon.  Otherwise normal colonoscopy without other polyps, masses, vascular ectasias, or inflammatory changes.   Retroflexed views in the rectum revealed no abnormalities.    The time to cecum = 3:22  minutes. The scope was then withdrawn in  15:00  minutes from the cecum and the procedure completed.  COMPLICATIONS:  None  ENDOSCOPIC IMPRESSION: 1) Diminutive polyp in the sigmoid colon - removed 2) Moderate diverticulosis in the sigmoid colon 3) Otherwise normal colonoscopy  RECOMMENDATIONS: 1) Repeat colonoscopy in 5 years if polyp adenomatous; otherwise prn  ______________________________ Wilhemina Bonito. Eda Keys, MD  CC:  Pecola Lawless, MD;  The Patient  n. eSIGNED:   Wilhemina Bonito. Eda Keys at 03/01/2012 11:52 AM  Woody Seller, [phone]

## 2012-03-01 NOTE — Progress Notes (Signed)
Patient did not experience any of the following events: a burn prior to discharge; a fall within the facility; wrong site/side/patient/procedure/implant event; or a hospital transfer or hospital admission upon discharge from the facility. (G8907) Patient did not have preoperative order for IV antibiotic SSI prophylaxis. (G8918)  

## 2012-03-02 ENCOUNTER — Telehealth: Payer: Self-pay | Admitting: *Deleted

## 2012-03-02 NOTE — Telephone Encounter (Signed)
  Follow up Call-  Call back number 03/01/2012  Post procedure Call Back phone  # [phone]  Permission to leave phone message Yes     Patient questions:  Do you have a fever, pain , or abdominal swelling? no Pain Score  0 *  Have you tolerated food without any problems? yes  Have you been able to return to your normal activities? yes  Do you have any questions about your discharge instructions: Diet   no Medications  no Follow up visit  no  Do you have questions or concerns about your Care? no  Actions: * If pain score is 4 or above: No action needed, pain <4.

## 2012-03-06 ENCOUNTER — Encounter: Payer: Self-pay | Admitting: Internal Medicine

## 2012-06-06 ENCOUNTER — Ambulatory Visit: Payer: Medicare Other

## 2012-06-07 ENCOUNTER — Ambulatory Visit (INDEPENDENT_AMBULATORY_CARE_PROVIDER_SITE_OTHER): Payer: Medicare Other

## 2012-06-07 DIAGNOSIS — Z23 Encounter for immunization: Secondary | ICD-10-CM

## 2012-06-19 ENCOUNTER — Other Ambulatory Visit: Payer: Self-pay | Admitting: Internal Medicine

## 2012-06-19 MED ORDER — HYDROCHLOROTHIAZIDE 25 MG PO TABS: 12.5000 mg | ORAL_TABLET | Freq: Every day | ORAL | Status: DC

## 2012-06-19 NOTE — Telephone Encounter (Signed)
refill hydrochlorothiazide 25mg  IVA #45 alst fill 8.9.13--TAKE 1/2 TABLET BY MOUTH DAILY--last ov 11.6.12 NOTE no future appts., scheduled

## 2012-07-25 ENCOUNTER — Other Ambulatory Visit: Payer: Self-pay | Admitting: Internal Medicine

## 2012-07-25 NOTE — Telephone Encounter (Signed)
Patient needs to schedule a CPX  

## 2012-08-17 ENCOUNTER — Other Ambulatory Visit: Payer: Self-pay | Admitting: Internal Medicine

## 2012-08-17 ENCOUNTER — Telehealth: Payer: Self-pay | Admitting: Internal Medicine

## 2012-08-17 DIAGNOSIS — I10 Essential (primary) hypertension: Secondary | ICD-10-CM

## 2012-08-17 MED ORDER — AMLODIPINE BESYLATE 5 MG PO TABS: 10.0000 mg | ORAL_TABLET | Freq: Every day | ORAL | Status: DC

## 2012-08-17 NOTE — Telephone Encounter (Signed)
Rx sent to pharmacy by e-script.//AB/CMA 

## 2012-08-17 NOTE — Telephone Encounter (Signed)
Refill: Amlodipine besylate 10 mg tab. Take 1 tablet every day. Qty 90. Last fill 05-22-12

## 2012-08-17 NOTE — Telephone Encounter (Signed)
Pt has future appt scheduled 3.5.13  Refill done.

## 2012-08-22 LAB — HM COLONOSCOPY

## 2012-10-08 ENCOUNTER — Telehealth: Payer: Self-pay | Admitting: Internal Medicine

## 2012-10-08 DIAGNOSIS — I10 Essential (primary) hypertension: Secondary | ICD-10-CM

## 2012-10-08 MED ORDER — AMLODIPINE BESYLATE 5 MG PO TABS: 10.0000 mg | ORAL_TABLET | Freq: Every day | ORAL | Status: DC

## 2012-10-08 NOTE — Telephone Encounter (Signed)
Rx sent 

## 2012-10-08 NOTE — Telephone Encounter (Signed)
refill AmLODIPine Besylate (Tab) 5 MG Take 2 tablets (10 mg total) by mouth daily #90 last fill 12.27.13

## 2012-10-24 ENCOUNTER — Ambulatory Visit (INDEPENDENT_AMBULATORY_CARE_PROVIDER_SITE_OTHER): Payer: Medicare Other | Admitting: Internal Medicine

## 2012-10-24 ENCOUNTER — Encounter: Payer: Self-pay | Admitting: Internal Medicine

## 2012-10-24 VITALS — BP 150/88 | HR 75 | Temp 97.6°F | Resp 14 | Ht 60.0 in | Wt 121.0 lb

## 2012-10-24 DIAGNOSIS — Z0000 Encounter for general adult medical examination without abnormal findings: Secondary | ICD-10-CM

## 2012-10-24 LAB — CBC WITH DIFFERENTIAL/PLATELET
Basophils Absolute: 0 10*3/uL (ref 0.0–0.1)
HCT: 42.7 % (ref 36.0–46.0)
Hemoglobin: 14.5 g/dL (ref 12.0–15.0)
Lymphs Abs: 3.2 10*3/uL (ref 0.7–4.0)
MCHC: 34 g/dL (ref 30.0–36.0)
MCV: 90.9 fl (ref 78.0–100.0)
Monocytes Absolute: 0.5 10*3/uL (ref 0.1–1.0)
Neutro Abs: 3 10*3/uL (ref 1.4–7.7)
Platelets: 278 10*3/uL (ref 150.0–400.0)
RDW: 12.6 % (ref 11.5–14.6)

## 2012-10-24 LAB — BASIC METABOLIC PANEL
BUN: 14 mg/dL (ref 6–23)
CO2: 28 mEq/L (ref 19–32)
Chloride: 101 mEq/L (ref 96–112)
Glucose, Bld: 95 mg/dL (ref 70–99)
Potassium: 3.7 mEq/L (ref 3.5–5.1)
Sodium: 138 mEq/L (ref 135–145)

## 2012-10-24 LAB — HEPATIC FUNCTION PANEL
ALT: 17 U/L (ref 0–35)
AST: 20 U/L (ref 0–37)
Albumin: 4.3 g/dL (ref 3.5–5.2)
Total Bilirubin: 0.6 mg/dL (ref 0.3–1.2)

## 2012-10-24 LAB — LIPID PANEL
HDL: 66.3 mg/dL (ref >39.00)
VLDL: 24.4 mg/dL (ref 0.0–40.0)

## 2012-10-24 LAB — TSH: TSH: 2 u[IU]/mL (ref 0.35–5.50)

## 2012-10-24 MED ORDER — HYDROCHLOROTHIAZIDE 25 MG PO TABS: ORAL_TABLET | ORAL | Status: DC

## 2012-10-24 MED ORDER — AMLODIPINE BESYLATE 10 MG PO TABS: 10.0000 mg | ORAL_TABLET | Freq: Every day | ORAL | Status: DC

## 2012-10-24 NOTE — Progress Notes (Signed)
Subjective:    Patient ID: Renee Figueroa, female    DOB: 04/12/1934, 79 y.o.   MRN: [id number]  HPI Medicare Wellness Visit:  Psychosocial & medical history were reviewed as required by Medicare (abuse,antisocial behavioral risks,firearm risk).  Social history: caffeine: 1 cup coffee/ day  , alcohol:  Very rarely ,  tobacco use:   never Exercise :  See below No home & personal  safety / fall risk Activities of daily living: no limitations  Seatbelt  and smoke alarm employed. Power of Attorney/Living Will status : in place Ophthalmology exam current Hearing evaluation not current Orientation :oriented X 3  Memory & recall :good Spelling  testing:good Mood & affect : normal . Depression / anxiety: denied Travel history : last 2000 [place]  Immunization status : tetanus today Transfusion history:  none  Preventive health surveillance ( colonoscopies, BMD , mammograms,PAP as per protocol/ SOC): BMD due Dental care:  Every 6 mos. Chart reviewed &  Updated. Active issues reviewed & addressed.      Review of Systems CHRONIC HYPERTENSION follow-up:  Home blood pressure  average 135/75  Patient is compliant with medications  No adverse effects noted from medication  Exercise program as walking 5  times per week for 35 minutes  On low-salt diet    No chest pain, palpitations, dyspnea, claudication,edema or paroxysmal nocturnal dyspnea described  No significant lightheadedness, headache, epistaxis, or syncope          Objective:   Physical Exam Gen.: Thin but healthy and well-nourished in appearance. Alert, appropriate and cooperative throughout exam.Appears younger than stated age  Head: Normocephalic without obvious abnormalities  Eyes: No corneal or conjunctival inflammation noted.  Extraocular motion intact. Vision grossly normal with lenses Ears: External  ear exam reveals no significant lesions or deformities. Canals clear .TMs normal. Hearing is grossly decreased  bilaterally. Nose: External nasal exam reveals no deformity or inflammation. Nasal mucosa are pink and moist. No lesions or exudates noted.   Mouth: Oral mucosa and oropharynx reveal no lesions or exudates. Teeth in good repair. Neck: No deformities, masses, or tenderness noted. Range of motion & Thyroid normal. Lungs: Normal respiratory effort; chest expands symmetrically. Lungs are clear to auscultation without rales, wheezes, or increased work of breathing. Heart: Normal rate and rhythm. Normal S1 and S2. No gallop, click, or rub. Grade 1.5/6 systolic murmur loudest L base. Abdomen: Bowel sounds normal; abdomen soft and nontender. No masses, organomegaly or hernias noted.Aorta palpable ; no AAA Genitalia: As per Gyn                                  Musculoskeletal/extremities: Slight accentuated curvature of upper thoracic spine. No clubbing, cyanosis,or edema noted. Range of motion normal .Tone & strength  Normal. Joints  reveal significant  DJD DIP changes. Nail health good. Crepitus of knees. Able to lie down & sit up w/o help. Negative SLR bilaterally Vascular: Carotid, radial artery, dorsalis pedis and  posterior tibial pulses are full and equal. Carotid bruit vs murmur radiation to  L carotid artery . Neurologic: Alert and oriented x3. Deep tendon reflexes symmetrical and normal.        Skin: Intact without suspicious lesions or rashes. Lymph: No cervical, axillary lymphadenopathy present. Psych: Mood and affect are normal. Normally interactive  Assessment & Plan:  #1 Medicare Wellness Exam; criteria met ; data entered #2 Problem List reviewed ; Assessment/ Recommendations made Plan: see Orders

## 2012-10-24 NOTE — Patient Instructions (Addendum)
If you activate the  My Chart system; lab & Xray results will be released directly  to you as soon as I review & address these through the computer. As per [place] all records must be reviewed and signed off within 72 hours; but I attempt to complete this within 36 hours unless I have no access to the electronic medical record.If I wait more than 36 hours the volume of reports becomes difficult to manage optimally. In my absence ;my partners would address the results.Critical lab results will be communicated to you ASAP. If you choose not to sign up for My Chart within 36 hours of labs being drawn; results will be reviewed & interpretation added before being copied & mailed, causing a delay in getting the results to you.If you do not receive that report within 7-10 days ,please call. Additionally you can use this system to gain direct  access to your records  if  out of town or @ an office of a  physician who is not in  the My Chart network.  This improves continuity of care & places you in control of your medical record.  

## 2012-10-25 ENCOUNTER — Encounter: Payer: Self-pay | Admitting: Internal Medicine

## 2012-10-28 LAB — VITAMIN D 1,25 DIHYDROXY: Vitamin D 1, 25 (OH)2 Total: 48 pg/mL (ref 18–72)

## 2012-11-01 ENCOUNTER — Encounter: Payer: Self-pay | Admitting: Internal Medicine

## 2012-11-06 ENCOUNTER — Encounter: Payer: Self-pay | Admitting: Internal Medicine

## 2012-11-06 ENCOUNTER — Other Ambulatory Visit: Payer: Self-pay

## 2012-11-06 MED ORDER — ZOSTER VACCINE LIVE 19400 UNT/0.65ML ~~LOC~~ SOLR: 0.6500 mL | Freq: Once | SUBCUTANEOUS | Status: DC

## 2012-11-07 ENCOUNTER — Encounter: Payer: Self-pay | Admitting: Internal Medicine

## 2012-12-11 ENCOUNTER — Other Ambulatory Visit: Payer: Self-pay | Admitting: Internal Medicine

## 2012-12-24 ENCOUNTER — Encounter: Payer: Self-pay | Admitting: Internal Medicine

## 2012-12-24 ENCOUNTER — Ambulatory Visit (INDEPENDENT_AMBULATORY_CARE_PROVIDER_SITE_OTHER): Payer: Medicare Other | Admitting: Internal Medicine

## 2012-12-24 VITALS — BP 120/76 | HR 91 | Temp 98.6°F | Wt 119.0 lb

## 2012-12-24 DIAGNOSIS — J209 Acute bronchitis, unspecified: Secondary | ICD-10-CM

## 2012-12-24 DIAGNOSIS — J019 Acute sinusitis, unspecified: Secondary | ICD-10-CM

## 2012-12-24 MED ORDER — HYDROCODONE-HOMATROPINE 5-1.5 MG/5ML PO SYRP: 5.0000 mL | ORAL_SOLUTION | Freq: Four times a day (QID) | ORAL | Status: DC | PRN

## 2012-12-24 MED ORDER — AMOXICILLIN 500 MG PO CAPS: 500.0000 mg | ORAL_CAPSULE | Freq: Three times a day (TID) | ORAL | Status: DC

## 2012-12-24 NOTE — Patient Instructions (Addendum)
Stay well hydrated. Drink to thirst up to 32 ounces of fluids daily. 

## 2012-12-24 NOTE — Progress Notes (Signed)
  Subjective:    Patient ID: Renee Figueroa, female    DOB: 1933-12-03, 79 y.o.   MRN: [id number]  HPI Symptoms began 12/20/12 as a sore throat followed by chest congestion with a cough which was initially dry. The cough has progressed to the point where she has produced yellow sputum.  She also developed some laryngitis as of 5/2.  She had mild extrinsic symptoms of itchy, watery eyes with some sneezing. This did not respond significantly to Zyrtec therefore she initiated Mucinex.  As of today she's had a cough with yellow sputum & ? Low grade fever. Also she has had yellow nasal discharge. She believes the volume of the sputum and nasal purulence are essentially the same.   Review of Systems  She is not having frontal headache, facial pain, dental pain, otic pain, or otic discharge.     Objective:   Physical Exam General appearance:good health ;well nourished; no acute distress or increased work of breathing is present.  No  lymphadenopathy about the head, neck, or axilla noted.   Eyes: No conjunctival inflammation or lid edema is present.   Ears:  External ear exam shows no significant lesions or deformities.  Otoscopic examination reveals clear canals, tympanic membranes are intact bilaterally without bulging, retraction, inflammation or discharge.  Nose:  External nasal examination shows no deformity or inflammation. Nasal mucosa erythematous & dry without lesions or exudates. No septal dislocation or deviation.No obstruction to airflow.   Oral exam: Dental hygiene is good; lips and gums are healthy appearing.There is no oropharyngeal erythema or exudate noted.  Hoarse  Neck:  No deformities,  masses, or tenderness noted.    Heart:  Normal rate and regular rhythm. S1 and S2 normal without gallop,  click, rub or other extra sounds. Grade 1/6 systolic murmur  Lungs:Chest clear to auscultation; no wheezes, rhonchi,rales ,or rubs present.No increased work of breathing.  Rattly  cough  Extremities:  No cyanosis, edema, or clubbing  noted    Skin: Warm & dry        Assessment & Plan:  #1 upper respiratory infection  # bronchitis , acute w/o bronchospasm Plan: See orders and recommendations

## 2013-01-07 ENCOUNTER — Ambulatory Visit (INDEPENDENT_AMBULATORY_CARE_PROVIDER_SITE_OTHER)
Admission: RE | Admit: 2013-01-07 | Discharge: 2013-01-07 | Disposition: A | Discharge status: Home / Self Care | Payer: Medicare Other | Source: Ambulatory Visit | Attending: Internal Medicine | Admitting: Internal Medicine

## 2013-01-07 DIAGNOSIS — M810 Age-related osteoporosis without current pathological fracture: Secondary | ICD-10-CM

## 2013-06-04 ENCOUNTER — Encounter: Payer: Self-pay | Admitting: Internal Medicine

## 2013-06-11 ENCOUNTER — Ambulatory Visit (INDEPENDENT_AMBULATORY_CARE_PROVIDER_SITE_OTHER): Payer: Medicare Other | Admitting: General Practice

## 2013-06-11 DIAGNOSIS — Z23 Encounter for immunization: Secondary | ICD-10-CM

## 2013-08-20 ENCOUNTER — Ambulatory Visit (INDEPENDENT_AMBULATORY_CARE_PROVIDER_SITE_OTHER): Payer: Medicare Other | Admitting: Internal Medicine

## 2013-08-20 ENCOUNTER — Encounter: Payer: Self-pay | Admitting: Internal Medicine

## 2013-08-20 ENCOUNTER — Telehealth: Payer: Self-pay | Admitting: Internal Medicine

## 2013-08-20 VITALS — BP 148/69 | HR 94 | Temp 99.4°F | Wt 116.8 lb

## 2013-08-20 DIAGNOSIS — J069 Acute upper respiratory infection, unspecified: Secondary | ICD-10-CM

## 2013-08-20 DIAGNOSIS — J209 Acute bronchitis, unspecified: Secondary | ICD-10-CM

## 2013-08-20 MED ORDER — HYDROCODONE-HOMATROPINE 5-1.5 MG/5ML PO SYRP: 5.0000 mL | ORAL_SOLUTION | Freq: Four times a day (QID) | ORAL | Status: DC | PRN

## 2013-08-20 MED ORDER — AMOXICILLIN 500 MG PO CAPS: 500.0000 mg | ORAL_CAPSULE | Freq: Three times a day (TID) | ORAL | Status: DC

## 2013-08-20 NOTE — Telephone Encounter (Signed)
Spoke with patient and she is able to come for a 445 appt today. JG//CMA

## 2013-08-20 NOTE — Telephone Encounter (Signed)
Please advise 

## 2013-08-20 NOTE — Telephone Encounter (Signed)
4:45pm today?

## 2013-08-20 NOTE — Progress Notes (Signed)
Pre visit review using our clinic review tool, if applicable. No additional management support is needed unless otherwise documented below in the visit note. 

## 2013-08-20 NOTE — Telephone Encounter (Signed)
Patient's husband called on her behalf and states that she has a cough and low grade fever and wants to be seen today. He was advised that we have no openings today at our office or any other [facility] locations. He wants to know if Dr. Alwyn Ren can call her in medication or recommend what she do. Please advise.

## 2013-08-20 NOTE — Progress Notes (Signed)
   Subjective:    Patient ID: Renee Figueroa, female    DOB: 10-31-1933, 79 y.o.   MRN: [id number]  HPI    Symptoms began 10/19/12 has a scratchy throat and malaise. Her husband and other family members have had a similar clinical picture. The cough initially was dry it has become looser but still non productive. It has been associated with fever up to 100.9 and chills. She has  discomfort in the frontal area above the eyes and in the maxillary sinus areas. She's had some associated postnasal drainage  She's been using Vicks vaporizer.  She did take the flu shot  She has no history of asthma and has never smoked.    Review of Systems   She's had intermittent pressure in the left ear. There's been no otic discharge. She's had some yellow postnasal drainage. She also has had somewhat persistent sore throat for which she's used saline gargles.   The cough is not associated with wheezing or shortness of breath      Objective:   Physical Exam  General appearance:appears fatigued; adequately nourished; no acute distress or increased work of breathing is present.  No  lymphadenopathy about the head, neck, or axilla noted.   Eyes: No conjunctival inflammation or lid edema is present.   Ears:  External ear exam shows no significant lesions or deformities.  Otoscopic examination reveals clear canals, tympanic membranes are intact bilaterally without bulging, retraction, inflammation or discharge.  Nose:  External nasal examination shows no deformity or inflammation. Nasal mucosa are pink and moist without lesions or exudates. No septal dislocation or deviation.No obstruction to airflow. Slight hyponasal speech pattern  Oral exam: Dental hygiene is good; lips and gums are healthy appearing.There is no oropharyngeal erythema or exudate noted.   Neck:  No deformities,  masses, or tenderness noted.      Heart:  Normal rate and regular rhythm. S1 and S2 normal without gallop, click, rub or other  extra sounds. Flow murmur  Lungs:Chest clear to auscultation; no wheezes, rhonchi,rales ,or rubs present.No increased work of breathing.  Brassy cough  Extremities:  No cyanosis, edema, or clubbing  noted .Decreased thoracic height   Skin: Warm & dry           Assessment & Plan:  #1 acute bronchitis w/o bronchospasm #2 URI, acute Plan: See orders and recommendations

## 2013-08-20 NOTE — Patient Instructions (Signed)

## 2013-08-20 NOTE — Telephone Encounter (Signed)
Can you please schedule this? Thanks, Shanda Bumps

## 2013-09-19 ENCOUNTER — Other Ambulatory Visit: Payer: Self-pay | Admitting: Internal Medicine

## 2013-09-20 NOTE — Telephone Encounter (Signed)
HCTZ refilled per protocol. JG//CMA 

## 2013-10-25 ENCOUNTER — Encounter: Payer: Medicare Other | Admitting: Internal Medicine

## 2013-10-31 ENCOUNTER — Other Ambulatory Visit: Payer: Self-pay | Admitting: Internal Medicine

## 2013-12-26 ENCOUNTER — Encounter: Payer: Medicare Other | Admitting: Internal Medicine

## 2014-02-12 ENCOUNTER — Other Ambulatory Visit (INDEPENDENT_AMBULATORY_CARE_PROVIDER_SITE_OTHER): Payer: Medicare Other

## 2014-02-12 ENCOUNTER — Ambulatory Visit (INDEPENDENT_AMBULATORY_CARE_PROVIDER_SITE_OTHER): Payer: Medicare Other | Admitting: Internal Medicine

## 2014-02-12 ENCOUNTER — Encounter: Payer: Self-pay | Admitting: Internal Medicine

## 2014-02-12 VITALS — BP 150/78 | HR 78 | Temp 97.8°F | Ht 60.0 in | Wt 119.2 lb

## 2014-02-12 DIAGNOSIS — Z0000 Encounter for general adult medical examination without abnormal findings: Secondary | ICD-10-CM

## 2014-02-12 DIAGNOSIS — D126 Benign neoplasm of colon, unspecified: Secondary | ICD-10-CM

## 2014-02-12 DIAGNOSIS — E785 Hyperlipidemia, unspecified: Secondary | ICD-10-CM

## 2014-02-12 DIAGNOSIS — M810 Age-related osteoporosis without current pathological fracture: Secondary | ICD-10-CM

## 2014-02-12 DIAGNOSIS — I10 Essential (primary) hypertension: Secondary | ICD-10-CM

## 2014-02-12 LAB — CBC WITH DIFFERENTIAL/PLATELET
BASOS ABS: 0 10*3/uL (ref 0.0–0.1)
BASOS PCT: 0.4 % (ref 0.0–3.0)
EOS ABS: 0.1 10*3/uL (ref 0.0–0.7)
Eosinophils Relative: 2.1 % (ref 0.0–5.0)
HCT: 42.3 % (ref 36.0–46.0)
HEMOGLOBIN: 14.3 g/dL (ref 12.0–15.0)
Lymphocytes Relative: 45.9 % (ref 12.0–46.0)
Lymphs Abs: 3.1 10*3/uL (ref 0.7–4.0)
MCHC: 33.8 g/dL (ref 30.0–36.0)
MCV: 91 fl (ref 78.0–100.0)
MONO ABS: 0.5 10*3/uL (ref 0.1–1.0)
Monocytes Relative: 7.9 % (ref 3.0–12.0)
NEUTROS ABS: 3 10*3/uL (ref 1.4–7.7)
Neutrophils Relative %: 43.7 % (ref 43.0–77.0)
Platelets: 269 10*3/uL (ref 150.0–400.0)
RBC: 4.65 Mil/uL (ref 3.87–5.11)
RDW: 12.7 % (ref 11.5–15.5)
WBC: 6.8 10*3/uL (ref 4.0–10.5)

## 2014-02-12 LAB — HEPATIC FUNCTION PANEL
ALBUMIN: 4.4 g/dL (ref 3.5–5.2)
ALT: 20 U/L (ref 0–35)
AST: 22 U/L (ref 0–37)
Alkaline Phosphatase: 69 U/L (ref 39–117)
Bilirubin, Direct: 0.1 mg/dL (ref 0.0–0.3)
TOTAL PROTEIN: 7.6 g/dL (ref 6.0–8.3)
Total Bilirubin: 0.4 mg/dL (ref 0.2–1.2)

## 2014-02-12 LAB — TSH: TSH: 2.59 u[IU]/mL (ref 0.35–4.50)

## 2014-02-12 LAB — BASIC METABOLIC PANEL
BUN: 11 mg/dL (ref 6–23)
CO2: 27 meq/L (ref 19–32)
CREATININE: 0.7 mg/dL (ref 0.4–1.2)
Calcium: 9.5 mg/dL (ref 8.4–10.5)
Chloride: 102 mEq/L (ref 96–112)
GFR: 88.89 mL/min (ref >60.00)
GLUCOSE: 98 mg/dL (ref 70–99)
Potassium: 3.8 mEq/L (ref 3.5–5.1)
Sodium: 137 mEq/L (ref 135–145)

## 2014-02-12 LAB — LIPID PANEL
Cholesterol: 286 mg/dL — ABNORMAL HIGH (ref 0–200)
HDL: 70.8 mg/dL (ref >39.00)
LDL Cholesterol: 197 mg/dL — ABNORMAL HIGH (ref 0–99)
NonHDL: 215.2
TRIGLYCERIDES: 93 mg/dL (ref 0.0–149.0)
Total CHOL/HDL Ratio: 4
VLDL: 18.6 mg/dL (ref 0.0–40.0)

## 2014-02-12 NOTE — Progress Notes (Signed)
Pre visit review using our clinic review tool, if applicable. No additional management support is needed unless otherwise documented below in the visit note. 

## 2014-02-12 NOTE — Progress Notes (Signed)
Subjective:    Patient ID: Renee Figueroa, female    DOB: 1934-08-12, 80 y.o.   MRN: [id number]  HPI  [employer]/ Medicare Wellness Visit: Psychosocial and medical history were reviewed as required by Medicare (history related to abuse, antisocial behavior , firearm risk). Social history: Caffeine:1-1.5 cups/day , Alcohol:no  , Tobacco use:no Exercise:walks 4X/week 35 min Personal safety/fall risk:no Limitations of activities of daily living:no Seatbelt/ smoke alarm use:yes Healthcare Power of Attorney/Living Will status: UTD Ophthalmologic exam status:UTD Hearing evaluation status:not UTD Orientation: Oriented X 3 Memory and recall: excellent  Spelling or math testing: not tested Depression/anxiety assessment: no Foreign travel history:[place] 2005 Immunization status for influenza/pneumonia/ shingles /tetanus:UTD Transfusion history:no Preventive health care maintenance status: Colonoscopy/BMD/mammogram/Pap as per protocol/standard care: UTD Dental care: every 6 mos Chart reviewed and updated. Active issues reviewed and addressed as documented below.  Blood pressure range / average : 135/68-70 Compliant with anti hypertemsive medication. No lightheadedness or other adverse medication effect described.  A heart healthy /low salt diet is followed. Family history is  Neg for CVA      Review of Systems  Significant headaches, epistaxis, chest pain, palpitations, exertional dyspnea, claudication, paroxysmal nocturnal dyspnea, or edema absent.       Objective:   Physical Exam Gen.: Healthy and well-nourished in appearance. Obvious loss of height.Alert, appropriate and cooperative throughout exam. Appears younger than stated age  Head: Normocephalic without obvious abnormalities  Eyes: No corneal or conjunctival inflammation noted. Pupils equal round reactive to light and accommodation. Extraocular motion intact.  Ears: External  ear exam reveals no significant lesions or  deformities. Canals clear .TMs normal. Hearing is grossly decreased bilaterally. Nose: External nasal exam reveals no deformity or inflammation. Nasal mucosa are pink and moist. No lesions or exudates noted.   Mouth: Oral mucosa and oropharynx reveal no lesions or exudates. Teeth in good repair. Neck: No deformities, masses, or tenderness noted. Range of motion decreased.Thyroid normal. Lungs: Normal respiratory effort; chest expands symmetrically. Lungs are clear to auscultation without rales, wheezes, or increased work of breathing. Heart: Normal rate and rhythm. Normal S1 and S2. No gallop, click, or rub. Grade 6.4-3 /6 systolic murmur Abdomen: Bowel sounds normal; abdomen soft and nontender. No masses, organomegaly or hernias noted.Aorta palpable ; no AAA Genitalia: as per Gyn                                  Musculoskeletal/extremities: Severely accentuated curvature of  thoracic spine. No clubbing, cyanosis, edema, or significant extremity  deformity noted. Range of motion normal .Tone & strength normal. Hand joints reveal DJD DIP changes.  Fingernail  health good. Knee crepitus. Able to lie down & sit up w/o help. Negative SLR bilaterally Vascular: Carotid, radial artery, dorsalis pedis and  posterior tibial pulses are full and equal. No bruits present. Neurologic: Alert and oriented x3. Deep tendon reflexes symmetrical and normal.  Gait normal  .     Skin: Intact without suspicious lesions or rashes. Lymph: No cervical, axillary lymphadenopathy present. Psych: Mood and affect are normal. Normally interactive  Assessment & Plan:  #1 Medicare Wellness Exam; criteria met ; data entered #2 See Current Assessment & Plan in Problem List under specific DiagnosisThe labs will be reviewed and risks and options assessed. Written recommendations will be provided by mail or directly through My Chart.Further  evaluation or change in medical therapy will be directed by those results.

## 2014-02-12 NOTE — Patient Instructions (Signed)

## 2014-02-12 NOTE — Assessment & Plan Note (Signed)
CBC

## 2014-02-12 NOTE — Assessment & Plan Note (Signed)
Lipids, LFTs, TSH  

## 2014-02-12 NOTE — Assessment & Plan Note (Signed)
Blood pressure goals reviewed. BMET 

## 2014-02-12 NOTE — Assessment & Plan Note (Signed)
Vitamin D level 

## 2014-02-16 LAB — VITAMIN D 1,25 DIHYDROXY
Vitamin D 1, 25 (OH)2 Total: 46 pg/mL (ref 18–72)
Vitamin D3 1, 25 (OH)2: 46 pg/mL

## 2014-02-17 ENCOUNTER — Emergency Department (HOSPITAL_BASED_OUTPATIENT_CLINIC_OR_DEPARTMENT_OTHER)
Admission: EM | Admit: 2014-02-17 | Discharge: 2014-02-17 | Disposition: A | Discharge status: Home / Self Care | Payer: Medicare Other | Attending: Emergency Medicine | Admitting: Emergency Medicine

## 2014-02-17 ENCOUNTER — Emergency Department (HOSPITAL_BASED_OUTPATIENT_CLINIC_OR_DEPARTMENT_OTHER): Payer: Medicare Other

## 2014-02-17 ENCOUNTER — Encounter (HOSPITAL_BASED_OUTPATIENT_CLINIC_OR_DEPARTMENT_OTHER): Payer: Self-pay | Admitting: Emergency Medicine

## 2014-02-17 DIAGNOSIS — Z7982 Long term (current) use of aspirin: Secondary | ICD-10-CM | POA: Insufficient documentation

## 2014-02-17 DIAGNOSIS — Z85828 Personal history of other malignant neoplasm of skin: Secondary | ICD-10-CM | POA: Insufficient documentation

## 2014-02-17 DIAGNOSIS — Y9289 Other specified places as the place of occurrence of the external cause: Secondary | ICD-10-CM | POA: Insufficient documentation

## 2014-02-17 DIAGNOSIS — S6990XA Unspecified injury of unspecified wrist, hand and finger(s), initial encounter: Secondary | ICD-10-CM | POA: Insufficient documentation

## 2014-02-17 DIAGNOSIS — Z8619 Personal history of other infectious and parasitic diseases: Secondary | ICD-10-CM | POA: Insufficient documentation

## 2014-02-17 DIAGNOSIS — S63509A Unspecified sprain of unspecified wrist, initial encounter: Secondary | ICD-10-CM | POA: Insufficient documentation

## 2014-02-17 DIAGNOSIS — M79641 Pain in right hand: Secondary | ICD-10-CM

## 2014-02-17 DIAGNOSIS — S66911A Strain of unspecified muscle, fascia and tendon at wrist and hand level, right hand, initial encounter: Secondary | ICD-10-CM

## 2014-02-17 DIAGNOSIS — R296 Repeated falls: Secondary | ICD-10-CM | POA: Insufficient documentation

## 2014-02-17 DIAGNOSIS — I10 Essential (primary) hypertension: Secondary | ICD-10-CM | POA: Insufficient documentation

## 2014-02-17 DIAGNOSIS — Z8719 Personal history of other diseases of the digestive system: Secondary | ICD-10-CM | POA: Insufficient documentation

## 2014-02-17 DIAGNOSIS — R011 Cardiac murmur, unspecified: Secondary | ICD-10-CM | POA: Insufficient documentation

## 2014-02-17 DIAGNOSIS — M129 Arthropathy, unspecified: Secondary | ICD-10-CM | POA: Insufficient documentation

## 2014-02-17 DIAGNOSIS — Z79899 Other long term (current) drug therapy: Secondary | ICD-10-CM | POA: Insufficient documentation

## 2014-02-17 DIAGNOSIS — Y93K1 Activity, walking an animal: Secondary | ICD-10-CM | POA: Insufficient documentation

## 2014-02-17 DIAGNOSIS — Z87442 Personal history of urinary calculi: Secondary | ICD-10-CM | POA: Insufficient documentation

## 2014-02-17 DIAGNOSIS — S6980XA Other specified injuries of unspecified wrist, hand and finger(s), initial encounter: Secondary | ICD-10-CM | POA: Insufficient documentation

## 2014-02-17 DIAGNOSIS — E785 Hyperlipidemia, unspecified: Secondary | ICD-10-CM | POA: Insufficient documentation

## 2014-02-17 DIAGNOSIS — Z860100 Personal history of colon polyps, unspecified: Secondary | ICD-10-CM | POA: Insufficient documentation

## 2014-02-17 NOTE — ED Notes (Signed)
Right wrist injury occurred yesterday.  Pt fell while walking the dog.  Noted swelling at wrist.

## 2014-02-17 NOTE — Discharge Instructions (Signed)
Sprain °A sprain is a tear in one of the strong, fibrous tissues that connect your bones (ligaments). The severity of the sprain depends on how much of the ligament is torn. The tear can be either partial or complete. °CAUSES  °Often, sprains are a result of a fall or an injury. The force of the impact causes the fibers of your ligament to stretch beyond their normal length. This excess tension causes the fibers of your ligament to tear. °SYMPTOMS  °You may have some loss of motion or increased pain within your normal range of motion. Other symptoms include: °· Bruising. °· Tenderness. °· Swelling. °DIAGNOSIS  °In order to diagnose a sprain, your caregiver will physically examine you to determine how torn the ligament is. Your caregiver may also suggest an X-ray exam to make sure no bones are broken. °TREATMENT  °If your ligament is only partially torn, treatment usually involves keeping the injured area in a fixed position (immobilization) for a short period. To do this, your caregiver will apply a bandage, cast, or splint to keep the area from moving until it heals. For a partially torn ligament, the healing process usually takes 2 to 3 weeks. °If your ligament is completely torn, you may need surgery to reconnect the ligament to the bone or to reconstruct the ligament. After surgery, a cast or splint may be applied and will need to stay on for 4 to 6 weeks while your ligament heals. °HOME CARE INSTRUCTIONS °· Keep the injured area elevated to decrease swelling. °· To ease pain and swelling, apply ice to your joint twice a day, for 2 to 3 days. °¨ Put ice in a plastic bag. °¨ Place a towel between your skin and the bag. °¨ Leave the ice on for 15 minutes. °· Only take over-the-counter or prescription medicine for pain as directed by your caregiver. °· Do not leave the injured area unprotected until pain and stiffness go away (usually 3 to 4 weeks). °· Do not allow your cast or splint to get wet. Cover your cast or  splint with a plastic bag when you shower or bathe. Do not swim. °· Your caregiver may suggest exercises for you to do during your recovery to prevent or limit permanent stiffness. °SEEK IMMEDIATE MEDICAL CARE IF: °· Your cast or splint becomes damaged. °· Your pain becomes worse. °MAKE SURE YOU: °· Understand these instructions. °· Will watch your condition. °· Will get help right away if you are not doing well or get worse. °Document Released: 08/05/2000 Document Revised: 10/31/2011 Document Reviewed: 08/20/2011 °Exit[employer]® Patient Information ©2015 [employer]. This information is not intended to replace advice given to you by your health care provider. Make sure you discuss any questions you have with your health care provider. ° °

## 2014-02-17 NOTE — ED Provider Notes (Signed)
Medical screening examination/treatment/procedure(s) were conducted as a shared visit with non-physician practitioner(s) and myself.  I personally evaluated the patient during the encounter.   EKG Interpretation None     Pt seen and evaluated, pt with pain and swelling over dorsum of right wrist, 2+ radial pulse, distal cap  Refill brisk.  Pt presenting with c/o pain in right wrist, xray reassuring, no fracture identified.  Will apply wrist splint for comfort.  Pt advised that fracture may be missed on initial xray and if pain continues then should have repeat xray performed.   Threasa Beards, MD 02/17/14 1407

## 2014-02-17 NOTE — ED Provider Notes (Signed)
CSN: [id number]     Arrival date & time 02/17/14  1202 History   First MD Initiated Contact with Patient 02/17/14 1308     Chief Complaint  Patient presents with  . Wrist Injury     (Consider location/radiation/quality/duration/timing/severity/associated sxs/prior Treatment) HPI Comments: Pt states that she fell yesterday over her dog. This morning she started having right wrist pain and noticed some lateral pain and swelling to the area. States that she is also having some pain in her thumb. Denies numbness or weakness. No loc with fall.   The history is provided by the patient. No language interpreter was used.    Past Medical History  Diagnosis Date  . Hyperlipidemia   . Osteoporosis   . Hypertension   . Herpes zoster 04/2011    C 6  LUE  . Diverticulosis   . Colon polyps     adenomatous  . Allergy     seasonal  . Arthritis     mild  . Basal cell carcinoma of anterior chest     skin  . Heart murmur   . Renal calculi 1962     X 1    Past Surgical History  Procedure Laterality Date  . Dilation and curettage of uterus      age 14  . Colonoscopy with polypectomy  2004     adenomatous; Dr Henrene Pastor. Neg 2013  . Tonsillectomy and adenoidectomy    . G 2 p 2     Family History  Problem Relation Age of Onset  . Heart attack Father 61  . Hypertension Mother   . Stomach cancer Mother     died @ 71  . Colon cancer Neg Hx   . Colon polyps Neg Hx   . Rectal cancer Neg Hx   . Diabetes Neg Hx   . Stroke Neg Hx    History  Substance Use Topics  . Smoking status: Never Smoker   . Smokeless tobacco: Never Used  . Alcohol Use: 0.0 oz/week     Comment: maybe 1 drink a month   OB History   Grav Para Term Preterm Abortions TAB SAB Ect Mult Living   2 2             Review of Systems  Constitutional: Negative.   Respiratory: Negative.   Cardiovascular: Negative.       Allergies  Atorvastatin  Home Medications   Prior to Admission medications   Medication Sig  Start Date End Date Taking? Authorizing Tanicia Wolaver  amLODipine (NORVASC) 10 MG tablet TAKE 1 TABLET BY MOUTH DAILY.    Hendricks Limes, MD  aspirin EC 81 MG tablet Take 81 mg by mouth daily.    Historical Cinsere Mizrahi, MD  calcium carbonate 200 MG capsule Take 1,200 mg by mouth daily.      Historical Tylin Force, MD  cetirizine (ZYRTEC) 10 MG tablet Take 10 mg by mouth daily as needed. allergies    Historical Temica Righetti, MD  fluticasone (FLONASE) 50 MCG/ACT nasal spray USE 1 SPRAY IN EACH NOSTRIL TWICE A DAY AS NEEDED 12/11/12   Hendricks Limes, MD  hydrochlorothiazide (HYDRODIURIL) 25 MG tablet TAKE 1/2 TABLET BY MOUTH DAILY 09/19/13   Hendricks Limes, MD  Multiple Vitamins-Minerals (CENTRUM SILVER ULTRA WOMENS PO) Take by mouth daily.    Historical Orvetta Danielski, MD   BP 168/84  Pulse 103  Temp(Src) 98.8 F (37.1 C) (Oral)  Resp 16  Ht 5' (1.524 m)  Wt 116 lb (52.617 kg)  BMI 22.65 kg/m2  SpO2 97% Physical Exam  Nursing note and vitals reviewed. Constitutional: She is oriented to person, place, and time. She appears well-developed and well-nourished.  Cardiovascular: Normal rate and regular rhythm.   Pulmonary/Chest: Effort normal and breath sounds normal.  Musculoskeletal:  Swelling noted to there right lateral wrist. No gross deformity noted the area. Pt has full rom.  Neurological: She is alert and oriented to person, place, and time. Coordination normal.  Skin: Skin is warm and dry.    ED Course  Procedures (including critical care time) Labs Review Labs Reviewed - No data to display  Imaging Review Dg Wrist Complete Right  02/17/2014   CLINICAL DATA:  Status post fall.  Ulnar wrist pain.  EXAM: RIGHT WRIST - COMPLETE 3+ VIEW  COMPARISON:  None.  FINDINGS: Soft tissues about the wrist swollen. No fracture is identified. The patient has severe first Sawyer osteoarthritis. Chondrocalcinosis of the triangular fibrocartilage and carpus is identified. Lucent lesions in the scaphoid and lunate may  be cysts or enchondromas.  IMPRESSION: Soft tissue swelling without underlying acute bony or joint abnormality.  Findings compatible with CPPD arthropathy.  Severe first CMC osteoarthritis.   Electronically Signed   By: Inge Rise M.D.   On: 02/17/2014 12:37   Dg Hand Complete Right  02/17/2014   CLINICAL DATA:  Fall.  Wrist injury and pain.  EXAM: RIGHT HAND - COMPLETE 3+ VIEW  COMPARISON:  02/17/2014 wrist radiographs  FINDINGS: There is no evidence of acute fracture or dislocation. There is no evidence of arthropathy or other focal bone abnormality. Soft tissues are unremarkable.  Prominent degenerative spurring and joint space narrowing is seen involving the distal interphalangeal joints and interphalangeal joint of the thumb. Similar changes are seen involving the second through fourth MTP joints, base of thumb, and STT joint complex. Chondrocalcinosis is seen involving the triangular fibrocartilage in the wrist.  IMPRESSION: No acute findings.  Advanced osteoarthritis versus CPPD arthropathy.   Electronically Signed   By: Earle Gell M.D.   On: 02/17/2014 13:44     EKG Interpretation None      MDM   Final diagnoses:  Wrist strain, right, initial encounter  Pain of right hand    No bony abnormality noted to the area. Will splint for comfort. Neurovascularly intact    Glendell Docker, NP 02/17/14 1405

## 2014-04-14 ENCOUNTER — Other Ambulatory Visit: Payer: Self-pay

## 2014-04-14 MED ORDER — HYDROCHLOROTHIAZIDE 25 MG PO TABS: ORAL_TABLET | ORAL | Status: DC

## 2014-04-29 ENCOUNTER — Telehealth: Payer: Self-pay | Admitting: *Deleted

## 2014-04-29 MED ORDER — AMLODIPINE BESYLATE 10 MG PO TABS: ORAL_TABLET | ORAL | Status: DC

## 2014-04-29 NOTE — Telephone Encounter (Signed)
Pt state she is needing new rx for her amlodipine 10 mg sent to [id number]. Inform pt will send,,,/lmb

## 2014-05-21 ENCOUNTER — Ambulatory Visit (INDEPENDENT_AMBULATORY_CARE_PROVIDER_SITE_OTHER): Payer: Medicare Other

## 2014-05-21 DIAGNOSIS — Z23 Encounter for immunization: Secondary | ICD-10-CM

## 2014-06-23 ENCOUNTER — Encounter (HOSPITAL_BASED_OUTPATIENT_CLINIC_OR_DEPARTMENT_OTHER): Payer: Self-pay | Admitting: Emergency Medicine

## 2014-08-28 ENCOUNTER — Telehealth: Payer: Self-pay | Admitting: Internal Medicine

## 2014-08-28 MED ORDER — AMLODIPINE BESYLATE 10 MG PO TABS: ORAL_TABLET | ORAL | Status: DC

## 2014-08-28 MED ORDER — HYDROCHLOROTHIAZIDE 25 MG PO TABS: ORAL_TABLET | ORAL | Status: DC

## 2014-08-28 NOTE — Telephone Encounter (Signed)
Pt needs refills, 90 day supplies sent to [facility]  Hydrochlorathiazide, and  Amlodipine

## 2014-08-28 NOTE — Telephone Encounter (Signed)
Done

## 2014-11-24 ENCOUNTER — Telehealth: Payer: Self-pay | Admitting: Internal Medicine

## 2014-11-24 MED ORDER — HYDROCHLOROTHIAZIDE 25 MG PO TABS: ORAL_TABLET | ORAL | Status: DC

## 2014-11-24 MED ORDER — AMLODIPINE BESYLATE 10 MG PO TABS: ORAL_TABLET | ORAL | Status: DC

## 2014-11-24 NOTE — Telephone Encounter (Signed)
Patient needs refills on amLODipine (NORVASC) 10 MG tablet [[id number] and hydrochlorothiazide (HYDRODIURIL) 25 MG tablet [[id number]]. Needs 90 days on each prescription. Pharmacy is [facility].

## 2014-11-24 NOTE — Telephone Encounter (Signed)
Done

## 2015-01-21 DIAGNOSIS — H40053 Ocular hypertension, bilateral: Secondary | ICD-10-CM | POA: Diagnosis not present

## 2015-02-13 ENCOUNTER — Telehealth: Payer: Self-pay | Admitting: Internal Medicine

## 2015-02-13 MED ORDER — AMLODIPINE BESYLATE 10 MG PO TABS: ORAL_TABLET | ORAL | Status: DC

## 2015-02-13 MED ORDER — HYDROCHLOROTHIAZIDE 25 MG PO TABS: ORAL_TABLET | ORAL | Status: DC

## 2015-02-13 NOTE — Telephone Encounter (Signed)
Patient is requesting refill on amlodipine and hydrochlorothiazide to be sent to [facility]

## 2015-02-16 ENCOUNTER — Other Ambulatory Visit: Payer: Self-pay

## 2015-05-04 ENCOUNTER — Telehealth: Payer: Self-pay | Admitting: *Deleted

## 2015-05-04 MED ORDER — AMLODIPINE BESYLATE 10 MG PO TABS: ORAL_TABLET | ORAL | Status: DC

## 2015-05-04 MED ORDER — HYDROCHLOROTHIAZIDE 25 MG PO TABS: ORAL_TABLET | ORAL | Status: DC

## 2015-05-04 NOTE — Telephone Encounter (Signed)
Left msg on triage stating needing refills on HCTZ & Norvasc. Called pt back no answer LMOM can only send 30 day must see md for additional refills

## 2015-05-06 DIAGNOSIS — Z01419 Encounter for gynecological examination (general) (routine) without abnormal findings: Secondary | ICD-10-CM | POA: Diagnosis not present

## 2015-05-06 DIAGNOSIS — Z1231 Encounter for screening mammogram for malignant neoplasm of breast: Secondary | ICD-10-CM | POA: Diagnosis not present

## 2015-05-06 LAB — HM MAMMOGRAPHY

## 2015-05-21 DIAGNOSIS — N819 Female genital prolapse, unspecified: Secondary | ICD-10-CM | POA: Diagnosis not present

## 2015-05-22 ENCOUNTER — Ambulatory Visit (INDEPENDENT_AMBULATORY_CARE_PROVIDER_SITE_OTHER): Payer: Medicare Other

## 2015-05-22 DIAGNOSIS — Z23 Encounter for immunization: Secondary | ICD-10-CM

## 2015-05-22 DIAGNOSIS — N812 Incomplete uterovaginal prolapse: Secondary | ICD-10-CM | POA: Diagnosis not present

## 2015-06-24 ENCOUNTER — Telehealth: Payer: Self-pay | Admitting: *Deleted

## 2015-06-24 MED ORDER — HYDROCHLOROTHIAZIDE 25 MG PO TABS: ORAL_TABLET | ORAL | Status: DC

## 2015-06-24 MED ORDER — AMLODIPINE BESYLATE 10 MG PO TABS: ORAL_TABLET | ORAL | Status: DC

## 2015-06-24 NOTE — Telephone Encounter (Signed)
Receive call pt states she is needing refill on her BP meds & she want 90 day sent to [facility]. Inform pt per l;ast refill md stated OV is needed. Inform her need to get appt set up & I can send a 30 day to pharmacy. Made appt for 11/25, sent 30 day to [facility].../lmb

## 2015-07-15 ENCOUNTER — Ambulatory Visit (INDEPENDENT_AMBULATORY_CARE_PROVIDER_SITE_OTHER): Payer: Medicare Other | Admitting: Internal Medicine

## 2015-07-15 ENCOUNTER — Encounter: Payer: Self-pay | Admitting: Internal Medicine

## 2015-07-15 VITALS — BP 138/78 | HR 92 | Temp 98.1°F | Ht 60.0 in | Wt 117.5 lb

## 2015-07-15 DIAGNOSIS — Z23 Encounter for immunization: Secondary | ICD-10-CM

## 2015-07-15 DIAGNOSIS — D126 Benign neoplasm of colon, unspecified: Secondary | ICD-10-CM

## 2015-07-15 DIAGNOSIS — E785 Hyperlipidemia, unspecified: Secondary | ICD-10-CM

## 2015-07-15 DIAGNOSIS — M810 Age-related osteoporosis without current pathological fracture: Secondary | ICD-10-CM

## 2015-07-15 DIAGNOSIS — I10 Essential (primary) hypertension: Secondary | ICD-10-CM

## 2015-07-15 MED ORDER — HYDROCHLOROTHIAZIDE 25 MG PO TABS: 12.5000 mg | ORAL_TABLET | Freq: Every day | ORAL | Status: DC

## 2015-07-15 MED ORDER — AMLODIPINE BESYLATE 10 MG PO TABS: 10.0000 mg | ORAL_TABLET | Freq: Every day | ORAL | Status: DC

## 2015-07-15 NOTE — Assessment & Plan Note (Signed)
Vit D level.

## 2015-07-15 NOTE — Progress Notes (Signed)
Pre visit review using our clinic review tool, if applicable. No additional management support is needed unless otherwise documented below in the visit note. 

## 2015-07-15 NOTE — Assessment & Plan Note (Signed)
CBC

## 2015-07-15 NOTE — Patient Instructions (Signed)
Minimal Blood Pressure Goal= AVERAGE < 140/90;  Ideal is an AVERAGE < 135/85. This AVERAGE should be calculated from @ least 5-7 BP readings taken @ different times of day on different days of week. You should not respond to isolated BP readings , but rather the AVERAGE for that week .Please bring your  blood pressure cuff to office visits to verify that it is reliable.It  can also be checked against the blood pressure device at the pharmacy. Finger or wrist cuffs are not dependable; an arm cuff is. Your next office appointment will be determined based upon review of your pending labs  and  xrays  Those written interpretation of the lab results and instructions will be transmitted to you by My Chart OR by mail for your records.  Critical results will be called.   Followup as needed for any active or acute issue. Please report any significant change in your symptoms.

## 2015-07-15 NOTE — Assessment & Plan Note (Signed)
Blood pressure goals reviewed. BMET 

## 2015-07-15 NOTE — Assessment & Plan Note (Signed)
Lipids, LFTs, TSH  

## 2015-07-15 NOTE — Progress Notes (Signed)
   Subjective:    Patient ID: Renee Figueroa, female    DOB: Jan 08, 1934, 81 y.o.   MRN: [id number]  HPI The patient is here to assess status of active health conditions.  PMH, FH, & Social History reviewed & updated.No change in [place] as recorded.  She is on a heart healthy diet and walks @ least 30 minutes 3 X/week w/o cardiopulmonary symptoms. She has been compliant with her meds w/o adverse effects.  She has aged out of colonoscopy survelliance. She has no active GI symptoms.  She is being followed for early cataract formation. No significant visual dysfunction reported.  Dr Deatra Ina has prescribed a pessary for "pelvic prolapse".  ROS is positive for heat intolerance.  Review of Systems  Chest pain, palpitations, tachycardia, exertional dyspnea, paroxysmal nocturnal dyspnea, claudication or edema are absent. No unexplained weight loss, abdominal pain, significant dyspepsia, dysphagia, melena, rectal bleeding, or persistently small caliber stools. Dysuria, pyuria, hematuria, frequency, nocturia or polyuria are denied. Change in hair, skin, nails denied. No bowel changes of constipation or diarrhea. No intolerance to cold.     Objective:   Physical Exam   Pertinent or positive findings include:Appears younger than stated age. There is a grade 1 systolic murmur loudest at the base.  The aorta is palpable but there is no aneurysm.DIP arthritic changes are present in the hands. She has crepitus of the knees.  General appearance : Thin but adequately nourished; in no distress.  Eyes: No conjunctival inflammation or scleral icterus is present.  Oral exam:  Lips and gums are healthy appearing.There is no oropharyngeal erythema or exudate noted. Dental hygiene is good.  Heart:  Normal rate and regular rhythm. S1 and S2 normal without gallop, click, rub or other extra sounds    Lungs:Chest clear to auscultation; no wheezes, rhonchi,rales ,or rubs present.No increased work of breathing.     Abdomen: bowel sounds normal, soft and non-tender without masses, organomegaly or hernias noted.  No guarding or rebound.   Vascular : all pulses equal ; no bruits present.  Skin:Warm & dry.  Intact without suspicious lesions or rashes ; no tenting or jaundice   Lymphatic: No lymphadenopathy is noted about the head, neck, axilla  Neuro: Strength, tone & DTRs normal.     Assessment & Plan:  See Current Assessment & Plan in Problem List under specific Diagnosis

## 2015-07-20 DIAGNOSIS — H2513 Age-related nuclear cataract, bilateral: Secondary | ICD-10-CM | POA: Diagnosis not present

## 2015-07-20 DIAGNOSIS — H25013 Cortical age-related cataract, bilateral: Secondary | ICD-10-CM | POA: Diagnosis not present

## 2015-07-20 DIAGNOSIS — H40051 Ocular hypertension, right eye: Secondary | ICD-10-CM | POA: Diagnosis not present

## 2015-07-20 DIAGNOSIS — H40052 Ocular hypertension, left eye: Secondary | ICD-10-CM | POA: Diagnosis not present

## 2015-08-05 DIAGNOSIS — N819 Female genital prolapse, unspecified: Secondary | ICD-10-CM | POA: Diagnosis not present

## 2015-10-23 ENCOUNTER — Telehealth: Payer: Self-pay | Admitting: *Deleted

## 2015-10-23 MED ORDER — AMLODIPINE BESYLATE 10 MG PO TABS: 10.0000 mg | ORAL_TABLET | Freq: Every day | ORAL | Status: DC

## 2015-10-23 MED ORDER — HYDROCHLOROTHIAZIDE 25 MG PO TABS: 12.5000 mg | ORAL_TABLET | Freq: Every day | ORAL | Status: DC

## 2015-10-23 NOTE — Telephone Encounter (Signed)
Received call pt states she is needing refill on her amlodipine & HCTZ. Verified if she have been set-up to see new PCP. Made appt for her to see Dr. Quay Burow 12/01/15 @ 2:30. rx[facility].../lmb

## 2015-11-16 ENCOUNTER — Telehealth: Payer: Self-pay | Admitting: Internal Medicine

## 2015-11-16 MED ORDER — AMLODIPINE BESYLATE 10 MG PO TABS: 10.0000 mg | ORAL_TABLET | Freq: Every day | ORAL | Status: DC

## 2015-11-16 MED ORDER — HYDROCHLOROTHIAZIDE 25 MG PO TABS
12.5000 mg | ORAL_TABLET | Freq: Every day | ORAL | Status: DC
Start: 2015-11-16 — End: 2016-02-12

## 2015-11-16 NOTE — Telephone Encounter (Signed)
Is requesting refill on amlodipine and hydrochlorothiazide to be sent to [facility].

## 2015-12-01 ENCOUNTER — Ambulatory Visit: Payer: Medicare Other | Admitting: Internal Medicine

## 2016-02-12 ENCOUNTER — Ambulatory Visit (INDEPENDENT_AMBULATORY_CARE_PROVIDER_SITE_OTHER): Payer: Medicare Other | Admitting: Internal Medicine

## 2016-02-12 ENCOUNTER — Encounter: Payer: Self-pay | Admitting: Internal Medicine

## 2016-02-12 VITALS — BP 160/90 | HR 81 | Temp 98.0°F | Ht 60.0 in | Wt 116.0 lb

## 2016-02-12 DIAGNOSIS — E785 Hyperlipidemia, unspecified: Secondary | ICD-10-CM

## 2016-02-12 DIAGNOSIS — I10 Essential (primary) hypertension: Secondary | ICD-10-CM

## 2016-02-12 DIAGNOSIS — R011 Cardiac murmur, unspecified: Secondary | ICD-10-CM | POA: Insufficient documentation

## 2016-02-12 DIAGNOSIS — M810 Age-related osteoporosis without current pathological fracture: Secondary | ICD-10-CM

## 2016-02-12 MED ORDER — AMLODIPINE BESYLATE 10 MG PO TABS: 10.0000 mg | ORAL_TABLET | Freq: Every day | ORAL | Status: DC

## 2016-02-12 MED ORDER — HYDROCHLOROTHIAZIDE 25 MG PO TABS: 12.5000 mg | ORAL_TABLET | Freq: Every day | ORAL | Status: DC

## 2016-02-12 NOTE — Assessment & Plan Note (Signed)
Declined repeat blood work today or soon - would like to wait until next year

## 2016-02-12 NOTE — Assessment & Plan Note (Addendum)
Recommended Echo, Declined Echo at this time Will do at next visit

## 2016-02-12 NOTE — Progress Notes (Signed)
Pre visit review using our clinic review tool, if applicable. No additional management support is needed unless otherwise documented below in the visit note. 

## 2016-02-12 NOTE — Patient Instructions (Addendum)
  All other Health Maintenance issues reviewed.   All recommended immunizations and age-appropriate screenings are up-to-date or discussed.  No immunizations administered today.   Medications reviewed and updated.  No changes recommended at this time.  Your prescription(s) have been submitted to your pharmacy. Please take as directed and contact our office if you believe you are having problem(s) with the medication(s).   Please followup in one year   

## 2016-02-12 NOTE — Assessment & Plan Note (Addendum)
BP controlled, white coat htn here Continue current medications at current doses Recommended blood work, but she declined to have it done now

## 2016-02-12 NOTE — Assessment & Plan Note (Addendum)
dexa -2.5 at spine in 2014 Deferred dexa now -- will have it at a later date Continue exercise, cal, vitamin d

## 2016-02-12 NOTE — Progress Notes (Signed)
Subjective:    Patient ID: Renee Figueroa, female    DOB: 09-07-1933, 82 y.o.   MRN: [id number]  HPI She is here to establish with a new pcp.  She is here for follow up.  Hypertension: She is taking her medication daily. She has white coat hypertension.  She is compliant with a low sodium diet.  She denies chest pain, palpitations, edema, shortness of breath and regular headaches. She is exercising regularly.  She does not monitor her blood pressure at home.    Allergic rhinitis:  She has seaonsl allergies. She takes zyrtec and flonase as needed, which works.    Osteoporosis:  She is taking her calcium and vitamin d daily. She exercises regularly.  She has taken fosamax and did not tolerate it.  She did have one infusion of reclast.  Her last dexa was 2014.    She would like to hold off on blood work for now - will have done later    Medications and allergies reviewed with patient and updated if appropriate.  Patient Active Problem List   Diagnosis Date Noted  . COLONIC POLYPS, ADENOMATOUS 01/19/2010  . RHINITIS 01/19/2010  . DEGENERATIVE JOINT DISEASE 01/19/2010  . SKIN CANCER, HX OF 01/19/2010  . Hyperlipidemia 07/27/2007  . Essential hypertension 07/27/2007  . Osteoporosis 07/27/2007    Current Outpatient Prescriptions on File Prior to Visit  Medication Sig Dispense Refill  . amLODipine (NORVASC) 10 MG tablet Take 1 tablet (10 mg total) by mouth daily. Must keep April appt w/new PCP for refills 30 tablet 2  . calcium carbonate 200 MG capsule Take 1,200 mg by mouth daily.      . cetirizine (ZYRTEC) 10 MG tablet Take 10 mg by mouth daily as needed. allergies    . fluticasone (FLONASE) 50 MCG/ACT nasal spray USE 1 SPRAY IN EACH NOSTRIL TWICE A DAY AS NEEDED 16 g 5  . hydrochlorothiazide (HYDRODIURIL) 25 MG tablet Take 0.5 tablets (12.5 mg total) by mouth daily. Must keep April appt w/new PCP for refills 15 tablet 2  . Multiple Vitamins-Minerals (CENTRUM SILVER ULTRA WOMENS  PO) Take by mouth daily.     No current facility-administered medications on file prior to visit.    Past Medical History  Diagnosis Date  . Hyperlipidemia   . Osteoporosis   . Hypertension   . Herpes zoster 04/2011    C 6  LUE  . Diverticulosis   . Colon polyps     adenomatous  . Allergy     seasonal  . Arthritis     mild  . Basal cell carcinoma of anterior chest     skin  . Heart murmur   . Renal calculi 1962     X 1     Past Surgical History  Procedure Laterality Date  . Dilation and curettage of uterus      age 36  . Colonoscopy with polypectomy  2004     adenomatous; Dr Henrene Pastor. Neg 2013  . Tonsillectomy and adenoidectomy    . G 2 p 2      Social History   Social History  . Marital Status: Married    Spouse Name: N/A  . Number of Children: N/A  . Years of Education: N/A   Social History Main Topics  . Smoking status: Never Smoker   . Smokeless tobacco: Never Used  . Alcohol Use: 0.0 oz/week     Comment: maybe 1 drink a month  . Drug Use:  No  . Sexual Activity: Not Asked   Other Topics Concern  . None   Social History Narrative    Family History  Problem Relation Age of Onset  . Heart attack Father 67  . Hypertension Mother   . Stomach cancer Mother     died @ 89  . Colon cancer Neg Hx   . Colon polyps Neg Hx   . Rectal cancer Neg Hx   . Diabetes Neg Hx   . Stroke Neg Hx     Review of Systems  Constitutional: Negative for fever and chills.  Respiratory: Negative for cough, shortness of breath and wheezing.   Cardiovascular: Negative for chest pain, palpitations and leg swelling.  Gastrointestinal: Negative for nausea and abdominal pain.       No gerd  Neurological: Negative for dizziness, light-headedness and headaches.       Objective:   Filed Vitals:   02/12/16 1331  BP: 160/90  Pulse: 81  Temp: 98 F (36.7 C)   Filed Weights   02/12/16 1331  Weight: 116 lb (52.617 kg)   Body mass index is 22.65 kg/(m^2).   Physical  Exam Constitutional: Appears well-developed and well-nourished. No distress.  Neck: Neck supple. No tracheal deviation present. No thyromegaly present.  No carotid bruit. No cervical adenopathy.   Cardiovascular: Normal rate, regular rhythm and normal heart sounds.   3/6 systolic murmur heard.  No edema Pulmonary/Chest: Effort normal and breath sounds normal. No respiratory distress. No wheezes.       Assessment & Plan:   See Problem List for Assessment and Plan of chronic medical problems.  Follow up in one year  Binnie Rail, MD

## 2016-05-25 ENCOUNTER — Other Ambulatory Visit: Payer: Self-pay | Admitting: Obstetrics & Gynecology

## 2016-05-25 DIAGNOSIS — R928 Other abnormal and inconclusive findings on diagnostic imaging of breast: Secondary | ICD-10-CM

## 2016-05-30 ENCOUNTER — Ambulatory Visit
Admission: RE | Admit: 2016-05-30 | Discharge: 2016-05-30 | Disposition: A | Discharge status: Home / Self Care | Payer: Medicare Other | Source: Ambulatory Visit | Attending: Obstetrics & Gynecology | Admitting: Obstetrics & Gynecology

## 2016-05-30 DIAGNOSIS — R928 Other abnormal and inconclusive findings on diagnostic imaging of breast: Secondary | ICD-10-CM

## 2016-10-02 ENCOUNTER — Emergency Department (HOSPITAL_COMMUNITY): Payer: Medicare Other

## 2016-10-02 ENCOUNTER — Encounter (HOSPITAL_COMMUNITY): Payer: Self-pay | Admitting: Emergency Medicine

## 2016-10-02 ENCOUNTER — Emergency Department (HOSPITAL_COMMUNITY)
Admission: EM | Admit: 2016-10-02 | Discharge: 2016-10-02 | Disposition: A | Discharge status: Home / Self Care | Payer: Medicare Other | Attending: Emergency Medicine | Admitting: Emergency Medicine

## 2016-10-02 DIAGNOSIS — Z79899 Other long term (current) drug therapy: Secondary | ICD-10-CM | POA: Insufficient documentation

## 2016-10-02 DIAGNOSIS — I10 Essential (primary) hypertension: Secondary | ICD-10-CM | POA: Insufficient documentation

## 2016-10-02 DIAGNOSIS — J111 Influenza due to unidentified influenza virus with other respiratory manifestations: Secondary | ICD-10-CM | POA: Insufficient documentation

## 2016-10-02 DIAGNOSIS — R05 Cough: Secondary | ICD-10-CM | POA: Diagnosis present

## 2016-10-02 DIAGNOSIS — R69 Illness, unspecified: Secondary | ICD-10-CM

## 2016-10-02 DIAGNOSIS — R059 Cough, unspecified: Secondary | ICD-10-CM

## 2016-10-02 DIAGNOSIS — Z85828 Personal history of other malignant neoplasm of skin: Secondary | ICD-10-CM | POA: Diagnosis not present

## 2016-10-02 NOTE — ED Triage Notes (Addendum)
Pt had positive flu test on Tuesday.  Given Tamiflu and she went to [place] for grandson's wedding- just returned.  Didn't feel any better with Tamiflu and someone on vacation with them was a Dr and wrote her a Rx for methylprednisolone and cephalexin that she started on Friday.  States she still doesn't feel any better.  C/o productive cough with green sputum x 5-6 weeks.

## 2016-10-02 NOTE — ED Provider Notes (Signed)
Renee Figueroa Note   CSN: [id number] Arrival date & time: 10/02/16  1924     History   Chief Complaint Chief Complaint  Patient presents with  . Cough    HPI Renee Figueroa is a 83 y.o. female.  An 83 year old female who was diagnosed with flu by her primary care physician on Tuesday.  She was prescribed Tamiflu, which she's been taking.  She proceeded to go to her grandson's wedding in [place] at which time she developed hoarseness and a physician that was attending the wedding prescribed, steroids and cephalexin which she's been taking without any relief of her symptoms.  She returns today via airplane and came immediately to the hospital for evaluation because she is "not feeling better."      Past Medical History:  Diagnosis Date  . Allergy    seasonal  . Arthritis    mild  . Basal cell carcinoma of anterior chest    skin  . Colon polyps    adenomatous  . Diverticulosis   . Heart murmur   . Herpes zoster 04/2011   C 6  LUE  . Hyperlipidemia   . Hypertension   . Osteoporosis   . Renal calculi 1962    X 1     Patient Active Problem List   Diagnosis Date Noted  . Heart murmur 02/12/2016  . COLONIC POLYPS, ADENOMATOUS 01/19/2010  . RHINITIS 01/19/2010  . DEGENERATIVE JOINT DISEASE 01/19/2010  . SKIN CANCER, HX OF 01/19/2010  . Hyperlipidemia 07/27/2007  . Essential hypertension 07/27/2007  . Osteoporosis 07/27/2007    Past Surgical History:  Procedure Laterality Date  . colonoscopy with polypectomy  2004    adenomatous; Dr Henrene Pastor. Neg 2013  . DILATION AND CURETTAGE OF UTERUS     age 28  . G 2 P 2    . TONSILLECTOMY AND ADENOIDECTOMY      OB History    Gravida Para Term Preterm AB Living   2 2           SAB TAB Ectopic Multiple Live Births                   Home Medications    Prior to Admission medications   Medication Sig Start Date End Date Taking? Authorizing Figueroa  amLODipine (NORVASC) 10 MG tablet Take 1 tablet (10  mg total) by mouth daily. 02/12/16  Yes Binnie Rail, MD  cephALEXin (KEFLEX) 500 MG capsule Take 500 mg by mouth 2 (two) times daily.   Yes Historical Provider, MD  hydrochlorothiazide (HYDRODIURIL) 25 MG tablet Take 0.5 tablets (12.5 mg total) by mouth daily. refills 02/12/16  Yes Binnie Rail, MD  methylPREDNISolone (MEDROL) 4 MG tablet Take 4-8 mg by mouth daily.   Yes Historical Provider, MD  oseltamivir (TAMIFLU) 75 MG capsule Take 75 mg by mouth 2 (two) times daily. 09/27/16   Historical Provider, MD    Family History Family History  Problem Relation Age of Onset  . Hypertension Mother   . Stomach cancer Mother     died @ 63  . Heart attack Father 17  . Colon cancer Neg Hx   . Colon polyps Neg Hx   . Rectal cancer Neg Hx   . Diabetes Neg Hx   . Stroke Neg Hx     Social History Social History  Substance Use Topics  . Smoking status: Never Smoker  . Smokeless tobacco: Never Used  . Alcohol use 0.0 oz/week  Comment: maybe 1 drink a month     Allergies   Atorvastatin   Review of Systems Review of Systems  Constitutional: Positive for fever.  HENT: Positive for voice change.   Respiratory: Positive for cough. Negative for shortness of breath.   Cardiovascular: Negative for chest pain.  Gastrointestinal: Negative for nausea.  Genitourinary: Negative for dysuria.  Skin: Negative for rash.  Neurological: Negative for weakness.  All other systems reviewed and are negative.    Physical Exam Updated Vital Signs BP 129/86 (BP Location: Left Arm)   Pulse 106   Temp 98.6 F (37 C) (Oral)   Resp 16   Ht 4\' 11"  (1.499 m)   Wt 54.4 kg   SpO2 96%   BMI 24.24 kg/m   Physical Exam  Constitutional: She appears well-developed and well-nourished.  HENT:  Head: Normocephalic.  Mouth/Throat: Oropharynx is clear and moist.  Eyes: Pupils are equal, round, and reactive to light.  Neck: Normal range of motion.  Cardiovascular: Tachycardia present.   Pulmonary/Chest:  Effort normal. No respiratory distress. She has no wheezes.  Musculoskeletal: Normal range of motion.  Neurological: She is alert.  Skin: Skin is warm.  Nursing note and vitals reviewed.    ED Treatments / Results  Labs (all labs ordered are listed, but only abnormal results are displayed) Labs Reviewed - No data to display  EKG  EKG Interpretation None       Radiology Dg Chest 2 View  Result Date: 10/02/2016 CLINICAL DATA:  Recent positive flu test with persistent symptoms of cough EXAM: CHEST  2 VIEW COMPARISON:  12/25/2003 FINDINGS: Cardiac shadow is within normal limits. The lungs are well aerated bilaterally. Mild interstitial changes are seen of a chronic nature. No focal infiltrate or sizable effusion is seen. No bony abnormality is noted. IMPRESSION: No active cardiopulmonary disease. Electronically Signed   By: Inez Catalina M.D.   On: 10/02/2016 21:34    Procedures Procedures (including critical care time)  Medications Ordered in ED Medications - No data to display   Initial Impression / Assessment and Plan / ED Course  I have reviewed the triage vital signs and the nursing notes.  Pertinent labs & imaging results that were available during my care of the patient were reviewed by me and considered in my medical decision making (see chart for details).        Final Clinical Impressions(s) / ED Diagnoses   Final diagnoses:  Influenza-like illness  Cough    New Prescriptions New Prescriptions   No medications on file     Renee Creamer, NP 10/02/16 2239    Renee Grosser, MD 10/03/16 [phone]

## 2016-10-02 NOTE — Discharge Instructions (Signed)
Your chest x ray is normal  Please continue taking the Tamiflu prescribed by your PCP Call and make an appointment for follow up It will take some time for the flu symptoms to abate

## 2016-10-02 NOTE — ED Notes (Signed)
Patient transported to X-ray 

## 2016-10-06 ENCOUNTER — Ambulatory Visit (INDEPENDENT_AMBULATORY_CARE_PROVIDER_SITE_OTHER): Payer: Medicare Other | Admitting: Internal Medicine

## 2016-10-06 ENCOUNTER — Encounter: Payer: Self-pay | Admitting: Internal Medicine

## 2016-10-06 DIAGNOSIS — J111 Influenza due to unidentified influenza virus with other respiratory manifestations: Secondary | ICD-10-CM

## 2016-10-06 NOTE — Assessment & Plan Note (Signed)
She has finished tamiflu and does not require further treatment. Reviewed her CXR with her during the visit. Advised to resume her home zyrtec to help with drainage.

## 2016-10-06 NOTE — Progress Notes (Signed)
   Subjective:    Patient ID: Renee Figueroa, female    DOB: May 08, 1934, 83 y.o.   MRN: [id number]  HPI The patient is an 83 YO female coming in for ER follow up. She was seen for viral illness and has had multiple treatments already for this. She has seen someone and been prescribed tamiflu, then went to [place] for wedding and a doctor in attendance gave her steroids and keflex which did not help. She then came back and went immediately to the ER and they gave her no additional treatment. She is not taking zyrtec at this time but does use it for allergies some times. Still mild cough and drainage but overall is improving gradually.   Review of Systems  Constitutional: Positive for activity change and fatigue. Negative for appetite change, chills, fever and unexpected weight change.  HENT: Positive for congestion and rhinorrhea. Negative for ear discharge, ear pain, postnasal drip, sinus pain, sinus pressure, sore throat and trouble swallowing.   Eyes: Negative.   Respiratory: Positive for cough. Negative for chest tightness, shortness of breath and wheezing.   Cardiovascular: Negative.   Gastrointestinal: Negative.   Musculoskeletal: Negative.    CXR manually reviewed and resulted discussed with the patient, no pneumonia, some chronic changes which are not significant to current illness.     Objective:   Physical Exam  Constitutional: She is oriented to person, place, and time. She appears well-developed and well-nourished.  HENT:  Head: Normocephalic and atraumatic.  Right Ear: External ear normal.  Left Ear: External ear normal.  Oropharynx with mild redness and clear drainage.   Eyes: EOM are normal.  Neck: Normal range of motion. No JVD present.  Cardiovascular: Normal rate and regular rhythm.   Murmur heard. Pulmonary/Chest: Effort normal and breath sounds normal. No respiratory distress. She has no wheezes. She has no rales.  Abdominal: Soft.  Musculoskeletal: She exhibits no  edema.  Lymphadenopathy:    She has no cervical adenopathy.  Neurological: She is alert and oriented to person, place, and time. Coordination normal.  Skin: Skin is warm and dry.   Vitals:   10/06/16 1415  BP: (!) 150/68  Pulse: 88  Temp: 98.6 F (37 C)  TempSrc: Oral  SpO2: 96%  Weight: 113 lb (51.3 kg)  Height: 4\' 11"  (1.499 m)      Assessment & Plan:

## 2016-10-06 NOTE — Progress Notes (Signed)
Pre visit review using our clinic review tool, if applicable. No additional management support is needed unless otherwise documented below in the visit note. 

## 2016-10-06 NOTE — Patient Instructions (Signed)
You can start using the zyrtec daily to help with the congestion.   Use the cough syrup if needed for cough.

## 2017-02-12 NOTE — Progress Notes (Signed)
Subjective:    Patient ID: Renee Figueroa, female    DOB: Jun 08, 1934, 84 y.o.   MRN: [id number]  HPI Here for subsequent medicare wellness exam and annual physical exam.   I have personally reviewed and have noted 1.The patient's medical and social history 2.Their use of alcohol, tobacco or illicit drugs 3.Their current medications and supplements 4.The patient's functional ability including ADL's, fall risks, home safety              risks and hearing or visual impairment. 5.Diet and physical activities 6.Evidence for depression or mood disorders 7.Care team reviewed - gyn - dr Alden Hipp, derm - dr Nicholaus Corolla, eye              Doctor - ? name   Are there smokers in your home (other than you)? No  Risk Factors Exercise: walking Dietary issues discussed: well balanced, eats out a lot  Cardiac risk factors: advanced age, hypertension  Depression Screen  Have you felt down, depressed or hopeless? No  Have you felt little interest or pleasure in doing things?  No  Activities of Daily Living In your present state of health, do you have any difficulty performing the following activities?:  Driving? No Managing money?  No Feeding yourself? No Getting from bed to chair? No Climbing a flight of stairs? No Preparing food and eating?: No Bathing or showering? No Getting dressed: No Getting to/using the toilet? No Moving around from place to place: No In the past year have you fallen or had a near fall?: No   Are you sexually active?  No  Do you have more than one partner?  N/A  Hearing Difficulties: yes - hearing aids Do you often ask people to speak up or repeat themselves? yes Do you experience ringing or noises in your ears? No Do you have difficulty understanding soft or whispered voices? yes Vision:              Any change in vision:  no             Up to date with eye exam:   yes Memory:  Do  you feel that you have a problem with memory? No  Do you often misplace items? No  Do you feel safe at home?  Yes  Cognitive Testing  Alert, Orientated? Yes  Normal Appearance? Yes  Recall of three objects?  Yes  Can perform simple calculations? Yes  Displays appropriate judgment? Yes  Can read the correct time from a watch face? Yes   Advanced Directives have been discussed with the patient? Yes - in place, in writing, advised to bring in a copy   Medications and allergies reviewed with patient and updated if appropriate.  Patient Active Problem List   Diagnosis Date Noted  . Heart murmur 02/12/2016  . COLONIC POLYPS, ADENOMATOUS 01/19/2010  . RHINITIS 01/19/2010  . DEGENERATIVE JOINT DISEASE 01/19/2010  . SKIN CANCER, HX OF 01/19/2010  . Hyperlipidemia 07/27/2007  . Essential hypertension 07/27/2007  . Osteoporosis 07/27/2007    Current Outpatient Prescriptions on File Prior to Visit  Medication Sig Dispense Refill  . amLODipine (NORVASC) 10 MG tablet Take 1 tablet (10 mg total) by mouth daily. 90 tablet 3  . hydrochlorothiazide (HYDRODIURIL) 25 MG tablet Take 0.5 tablets (12.5 mg total) by mouth daily. refills 90 tablet 3  . methylPREDNISolone (MEDROL) 4 MG tablet Take 4-8 mg by mouth daily.     No current facility-administered medications on  file prior to visit.     Past Medical History:  Diagnosis Date  . Allergy    seasonal  . Arthritis    mild  . Basal cell carcinoma of anterior chest    skin  . Colon polyps    adenomatous  . Diverticulosis   . Heart murmur   . Herpes zoster 04/2011   C 6  LUE  . Hyperlipidemia   . Hypertension   . Osteoporosis   . Renal calculi 1962    X 1     Past Surgical History:  Procedure Laterality Date  . colonoscopy with polypectomy  2004    adenomatous; Dr Henrene Pastor. Neg 2013  . DILATION AND CURETTAGE OF UTERUS     age 57  . G 2 P 2    . TONSILLECTOMY AND ADENOIDECTOMY      Social History   Social History  . Marital  status: Married    Spouse name: N/A  . Number of children: N/A  . Years of education: N/A   Social History Main Topics  . Smoking status: Never Smoker  . Smokeless tobacco: Never Used  . Alcohol use 0.0 oz/week     Comment: maybe 1 drink a month  . Drug use: No  . Sexual activity: Not Asked   Other Topics Concern  . None   Social History Narrative  . None    Family History  Problem Relation Age of Onset  . Hypertension Mother   . Stomach cancer Mother        died @ 59  . Heart attack Father 55  . Colon cancer Neg Hx   . Colon polyps Neg Hx   . Rectal cancer Neg Hx   . Diabetes Neg Hx   . Stroke Neg Hx     Review of Systems  Constitutional: Negative for chills and fever.  HENT: Positive for hearing loss. Negative for tinnitus.   Eyes: Negative for visual disturbance.  Respiratory: Negative for cough, shortness of breath and wheezing.   Cardiovascular: Negative for chest pain, palpitations and leg swelling.  Gastrointestinal: Negative for abdominal pain, blood in stool, constipation, diarrhea and nausea.       No gerd  Genitourinary: Negative for dysuria and hematuria.  Musculoskeletal: Positive for arthralgias (knee pain) and back pain.  Skin: Negative for rash.  Neurological: Negative for light-headedness and headaches.  Psychiatric/Behavioral: Negative for dysphoric mood. The patient is not nervous/anxious.        Objective:   Vitals:   02/13/17 1418  BP: (!) 160/84  Pulse: 85  Resp: 16  Temp: 98.1 F (36.7 C)   Filed Weights   02/13/17 1418  Weight: 114 lb (51.7 kg)   Body mass index is 23.03 kg/m.  Wt Readings from Last 3 Encounters:  02/13/17 114 lb (51.7 kg)  10/06/16 113 lb (51.3 kg)  10/02/16 120 lb (54.4 kg)     Physical Exam Constitutional: She appears well-developed and well-nourished. No distress.  HENT:  Head: Normocephalic and atraumatic.  Right Ear: External ear normal. Normal ear canal and TM Left Ear: External ear normal.   Normal ear canal and TM Mouth/Throat: Oropharynx is clear and moist.  Eyes: Conjunctivae and EOM are normal.  Neck: Neck supple. No tracheal deviation present. No thyromegaly present.  No carotid bruit  Cardiovascular: Normal rate, regular rhythm and normal heart sounds.   3/6 systolic and 1/6 diastolic murmur heard.  No edema. Pulmonary/Chest: Effort normal and breath sounds normal. No respiratory  distress. She has no wheezes. She has no rales.  Breast: deferred to Gyn Abdominal: Soft. She exhibits no distension. There is no tenderness.  Lymphadenopathy: She has no cervical adenopathy.  Skin: Skin is warm and dry. She is not diaphoretic.  Psychiatric: She has a normal mood and affect. Her behavior is normal.         Assessment & Plan:   Wellness Exam: Immunizations  discussed shingrix (zostavax in 2014), others up to date Colonoscopy - no longer needed due to age 14  - no longer needed due to age 3  - Up to date  Dexa  - last scan 2014 -- deferred ordering a scan today - will consider next year Eye exam  Up to date  Hearing loss   Yes - wears hearing aids Memory concerns/difficulties   none Independent of ADLs  fully Stressed the importance of regular exercise   Patient received copy of preventative screening tests/immunizations recommended for the next 5-10 years.   Physical exam: Screening blood work ordered Immunizations  discussed shingrix (zostavax in 2014), others up to date Colonoscopy - no longer needed due to age 55  - no longer needed due to age 38  - Up to date  Dexa - last scan 2014 - deferred dexa today  Eye exams  Up to date  EKG   Last done 2014- no concerning symptoms - will order echo for murmur Exercise  - walking regularly Weight  Normal BMI Skin  No concerns Substance abuse  none  See Problem List for Assessment and Plan of chronic medical problems.  FU in one year

## 2017-02-13 ENCOUNTER — Encounter: Payer: Self-pay | Admitting: Internal Medicine

## 2017-02-13 ENCOUNTER — Ambulatory Visit (INDEPENDENT_AMBULATORY_CARE_PROVIDER_SITE_OTHER): Payer: Medicare Other | Admitting: Internal Medicine

## 2017-02-13 VITALS — BP 160/84 | HR 85 | Temp 98.1°F | Resp 16 | Wt 114.0 lb

## 2017-02-13 DIAGNOSIS — Z0000 Encounter for general adult medical examination without abnormal findings: Secondary | ICD-10-CM | POA: Diagnosis not present

## 2017-02-13 DIAGNOSIS — M810 Age-related osteoporosis without current pathological fracture: Secondary | ICD-10-CM | POA: Diagnosis not present

## 2017-02-13 DIAGNOSIS — Z85828 Personal history of other malignant neoplasm of skin: Secondary | ICD-10-CM | POA: Diagnosis not present

## 2017-02-13 DIAGNOSIS — E7800 Pure hypercholesterolemia, unspecified: Secondary | ICD-10-CM

## 2017-02-13 DIAGNOSIS — I10 Essential (primary) hypertension: Secondary | ICD-10-CM

## 2017-02-13 DIAGNOSIS — R011 Cardiac murmur, unspecified: Secondary | ICD-10-CM

## 2017-02-13 DIAGNOSIS — E785 Hyperlipidemia, unspecified: Secondary | ICD-10-CM | POA: Diagnosis not present

## 2017-02-13 MED ORDER — AMLODIPINE BESYLATE 10 MG PO TABS: 10.0000 mg | ORAL_TABLET | Freq: Every day | ORAL | 3 refills | Status: DC

## 2017-02-13 MED ORDER — HYDROCHLOROTHIAZIDE 25 MG PO TABS: 12.5000 mg | ORAL_TABLET | Freq: Every day | ORAL | 3 refills | Status: DC

## 2017-02-13 NOTE — Patient Instructions (Addendum)
Renee Figueroa , Thank you for taking time to come for your Medicare Wellness Visit. I appreciate your ongoing commitment to your health goals. Please review the following plan we discussed and let me know if I can assist you in the future.   These are the goals we discussed: Goals    None      This is a list of the screening recommended for you and due dates:  Health Maintenance  Topic Date Due  . DEXA scan (bone density measurement)  02/13/2018*  . Flu Shot  03/22/2017  . Tetanus Vaccine  10/25/2022  . Pneumonia vaccines  Completed  *Topic was postponed. The date shown is not the original due date.     Test(s) ordered today. Your results will be released to [place] (or called to you) after review, usually within 72hours after test completion. If any changes need to be made, you will be notified at that same time.  All other Health Maintenance issues reviewed.   All recommended immunizations and age-appropriate screenings are up-to-date or discussed.  No immunizations administered today.   Medications reviewed and updated.  No changes recommended at this time.  Your prescription(s) have been submitted to your pharmacy. Please take as directed and contact our office if you believe you are having problem(s) with the medication(s).  An Echo was ordered.   Please followup in one year   Health Maintenance, Female Adopting a healthy lifestyle and getting preventive care can go a long way to promote health and wellness. Talk with your health care provider about what schedule of regular examinations is right for you. This is a good chance for you to check in with your provider about disease prevention and staying healthy. In between checkups, there are plenty of things you can do on your own. Experts have done a lot of research about which lifestyle changes and preventive measures are most likely to keep you healthy. Ask your health care provider for more information. Weight and  diet Eat a healthy diet  Be sure to include plenty of vegetables, fruits, low-fat dairy products, and lean protein.  Do not eat a lot of foods high in solid fats, added sugars, or salt.  Get regular exercise. This is one of the most important things you can do for your health. ? Most adults should exercise for at least 150 minutes each week. The exercise should increase your heart rate and make you sweat (moderate-intensity exercise). ? Most adults should also do strengthening exercises at least twice a week. This is in addition to the moderate-intensity exercise.  Maintain a healthy weight  Body mass index (BMI) is a measurement that can be used to identify possible weight problems. It estimates body fat based on height and weight. Your health care provider can help determine your BMI and help you achieve or maintain a healthy weight.  For females 38 years of age and older: ? A BMI below 18.5 is considered underweight. ? A BMI of 18.5 to 24.9 is normal. ? A BMI of 25 to 29.9 is considered overweight. ? A BMI of 30 and above is considered obese.  Watch levels of cholesterol and blood lipids  You should start having your blood tested for lipids and cholesterol at 81 years of age, then have this test every 5 years.  You may need to have your cholesterol levels checked more often if: ? Your lipid or cholesterol levels are high. ? You are older than 81 years of age. ?  You are at high risk for heart disease.  Cancer screening Lung Cancer  Lung cancer screening is recommended for adults 22-69 years old who are at high risk for lung cancer because of a history of smoking.  A yearly low-dose CT scan of the lungs is recommended for people who: ? Currently smoke. ? Have quit within the past 15 years. ? Have at least a 30-pack-year history of smoking. A pack year is smoking an average of one pack of cigarettes a day for 1 year.  Yearly screening should continue until it has been 15 years  since you quit.  Yearly screening should stop if you develop a health problem that would prevent you from having lung cancer treatment.  Breast Cancer  Practice breast self-awareness. This means understanding how your breasts normally appear and feel.  It also means doing regular breast self-exams. Let your health care provider know about any changes, no matter how small.  If you are in your 20s or 30s, you should have a clinical breast exam (CBE) by a health care provider every 1-3 years as part of a regular health exam.  If you are 56 or older, have a CBE every year. Also consider having a breast X-ray (mammogram) every year.  If you have a family history of breast cancer, talk to your health care provider about genetic screening.  If you are at high risk for breast cancer, talk to your health care provider about having an MRI and a mammogram every year.  Breast cancer gene (BRCA) assessment is recommended for women who have family members with BRCA-related cancers. BRCA-related cancers include: ? Breast. ? Ovarian. ? Tubal. ? Peritoneal cancers.  Results of the assessment will determine the need for genetic counseling and BRCA1 and BRCA2 testing.  Cervical Cancer Your health care provider may recommend that you be screened regularly for cancer of the pelvic organs (ovaries, uterus, and vagina). This screening involves a pelvic examination, including checking for microscopic changes to the surface of your cervix (Pap test). You may be encouraged to have this screening done every 3 years, beginning at age 73.  For women ages 99-65, health care providers may recommend pelvic exams and Pap testing every 3 years, or they may recommend the Pap and pelvic exam, combined with testing for human papilloma virus (HPV), every 5 years. Some types of HPV increase your risk of cervical cancer. Testing for HPV may also be done on women of any age with unclear Pap test results.  Other health care  providers may not recommend any screening for nonpregnant women who are considered low risk for pelvic cancer and who do not have symptoms. Ask your health care provider if a screening pelvic exam is right for you.  If you have had past treatment for cervical cancer or a condition that could lead to cancer, you need Pap tests and screening for cancer for at least 20 years after your treatment. If Pap tests have been discontinued, your risk factors (such as having a new sexual partner) need to be reassessed to determine if screening should resume. Some women have medical problems that increase the chance of getting cervical cancer. In these cases, your health care provider may recommend more frequent screening and Pap tests.  Colorectal Cancer  This type of cancer can be detected and often prevented.  Routine colorectal cancer screening usually begins at 81 years of age and continues through 81 years of age.  Your health care provider may recommend screening  at an earlier age if you have risk factors for colon cancer.  Your health care provider may also recommend using home test kits to check for hidden blood in the stool.  A small camera at the end of a tube can be used to examine your colon directly (sigmoidoscopy or colonoscopy). This is done to check for the earliest forms of colorectal cancer.  Routine screening usually begins at age 73.  Direct examination of the colon should be repeated every 5-10 years through 80 years of age. However, you may need to be screened more often if early forms of precancerous polyps or small growths are found.  Skin Cancer  Check your skin from head to toe regularly.  Tell your health care provider about any new moles or changes in moles, especially if there is a change in a mole's shape or color.  Also tell your health care provider if you have a mole that is larger than the size of a pencil eraser.  Always use sunscreen. Apply sunscreen liberally and  repeatedly throughout the day.  Protect yourself by wearing long sleeves, pants, a wide-brimmed hat, and sunglasses whenever you are outside.  Heart disease, diabetes, and high blood pressure  High blood pressure causes heart disease and increases the risk of stroke. High blood pressure is more likely to develop in: ? People who have blood pressure in the high end of the normal range (130-139/85-89 mm Hg). ? People who are overweight or obese. ? People who are African American.  If you are 21-49 years of age, have your blood pressure checked every 3-5 years. If you are 103 years of age or older, have your blood pressure checked every year. You should have your blood pressure measured twice-once when you are at a hospital or clinic, and once when you are not at a hospital or clinic. Record the average of the two measurements. To check your blood pressure when you are not at a hospital or clinic, you can use: ? An automated blood pressure machine at a pharmacy. ? A home blood pressure monitor.  If you are between 61 years and 3 years old, ask your health care provider if you should take aspirin to prevent strokes.  Have regular diabetes screenings. This involves taking a blood sample to check your fasting blood sugar level. ? If you are at a normal weight and have a low risk for diabetes, have this test once every three years after 81 years of age. ? If you are overweight and have a high risk for diabetes, consider being tested at a younger age or more often. Preventing infection Hepatitis B  If you have a higher risk for hepatitis B, you should be screened for this virus. You are considered at high risk for hepatitis B if: ? You were born in a country where hepatitis B is common. Ask your health care provider which countries are considered high risk. ? Your parents were born in a high-risk country, and you have not been immunized against hepatitis B (hepatitis B vaccine). ? You have HIV or  AIDS. ? You use needles to inject street drugs. ? You live with someone who has hepatitis B. ? You have had sex with someone who has hepatitis B. ? You get hemodialysis treatment. ? You take certain medicines for conditions, including cancer, organ transplantation, and autoimmune conditions.  Hepatitis C  Blood testing is recommended for: ? Everyone born from [birth year] through 1963. ? Anyone with known risk factors  for hepatitis C.  Sexually transmitted infections (STIs)  You should be screened for sexually transmitted infections (STIs) including gonorrhea and chlamydia if: ? You are sexually active and are younger than 81 years of age. ? You are older than 81 years of age and your health care provider tells you that you are at risk for this type of infection. ? Your sexual activity has changed since you were last screened and you are at an increased risk for chlamydia or gonorrhea. Ask your health care provider if you are at risk.  If you do not have HIV, but are at risk, it may be recommended that you take a prescription medicine daily to prevent HIV infection. This is called pre-exposure prophylaxis (PrEP). You are considered at risk if: ? You are sexually active and do not regularly use condoms or know the HIV status of your partner(s). ? You take drugs by injection. ? You are sexually active with a partner who has HIV.  Talk with your health care provider about whether you are at high risk of being infected with HIV. If you choose to begin PrEP, you should first be tested for HIV. You should then be tested every 3 months for as long as you are taking PrEP. Pregnancy  If you are premenopausal and you may become pregnant, ask your health care provider about preconception counseling.  If you may become pregnant, take 400 to 800 micrograms (mcg) of folic acid every day.  If you want to prevent pregnancy, talk to your health care provider about birth control (contraception). Osteoporosis  and menopause  Osteoporosis is a disease in which the bones lose minerals and strength with aging. This can result in serious bone fractures. Your risk for osteoporosis can be identified using a bone density scan.  If you are 32 years of age or older, or if you are at risk for osteoporosis and fractures, ask your health care provider if you should be screened.  Ask your health care provider whether you should take a calcium or vitamin D supplement to lower your risk for osteoporosis.  Menopause may have certain physical symptoms and risks.  Hormone replacement therapy may reduce some of these symptoms and risks. Talk to your health care provider about whether hormone replacement therapy is right for you. Follow these instructions at home:  Schedule regular health, dental, and eye exams.  Stay current with your immunizations.  Do not use any tobacco products including cigarettes, chewing tobacco, or electronic cigarettes.  If you are pregnant, do not drink alcohol.  If you are breastfeeding, limit how much and how often you drink alcohol.  Limit alcohol intake to no more than 1 drink per day for nonpregnant women. One drink equals 12 ounces of beer, 5 ounces of wine, or 1 ounces of hard liquor.  Do not use street drugs.  Do not share needles.  Ask your health care provider for help if you need support or information about quitting drugs.  Tell your health care provider if you often feel depressed.  Tell your health care provider if you have ever been abused or do not feel safe at home. This information is not intended to replace advice given to you by your health care provider. Make sure you discuss any questions you have with your health care provider. Document Released: 02/21/2011 Document Revised: 01/14/2016 Document Reviewed: 05/12/2015 Elsevier Interactive Patient Education  [employer].

## 2017-02-13 NOTE — Assessment & Plan Note (Signed)
Following with dermatology annually

## 2017-02-13 NOTE — Assessment & Plan Note (Signed)
Echo to check on murmur

## 2017-02-13 NOTE — Assessment & Plan Note (Addendum)
BP 125-135/ 60-80's at home,has white coat htn BP well controlled Current regimen effective and well tolerated Continue current medications at current doses

## 2017-02-13 NOTE — Assessment & Plan Note (Signed)
Deferred dexa today Exercising and has done PT

## 2017-02-13 NOTE — Assessment & Plan Note (Signed)
Check lipid panel Continue regular exercise

## 2017-02-17 ENCOUNTER — Encounter: Payer: Self-pay | Admitting: Internal Medicine

## 2017-02-17 NOTE — Progress Notes (Unsigned)
Results entered and sent to scan  

## 2017-03-13 ENCOUNTER — Other Ambulatory Visit (HOSPITAL_COMMUNITY): Payer: Medicare Other

## 2017-03-16 ENCOUNTER — Other Ambulatory Visit (INDEPENDENT_AMBULATORY_CARE_PROVIDER_SITE_OTHER): Payer: Medicare Other

## 2017-03-16 DIAGNOSIS — Z0000 Encounter for general adult medical examination without abnormal findings: Secondary | ICD-10-CM | POA: Diagnosis not present

## 2017-03-16 DIAGNOSIS — I10 Essential (primary) hypertension: Secondary | ICD-10-CM | POA: Diagnosis not present

## 2017-03-16 LAB — CBC WITH DIFFERENTIAL/PLATELET
BASOS ABS: 0 10*3/uL (ref 0.0–0.1)
BASOS PCT: 0.4 % (ref 0.0–3.0)
Eosinophils Absolute: 0.1 10*3/uL (ref 0.0–0.7)
Eosinophils Relative: 1.5 % (ref 0.0–5.0)
HCT: 42.5 % (ref 36.0–46.0)
Hemoglobin: 14.2 g/dL (ref 12.0–15.0)
LYMPHS ABS: 3.3 10*3/uL (ref 0.7–4.0)
Lymphocytes Relative: 43 % (ref 12.0–46.0)
MCHC: 33.3 g/dL (ref 30.0–36.0)
MCV: 91.6 fl (ref 78.0–100.0)
Monocytes Absolute: 0.5 10*3/uL (ref 0.1–1.0)
Monocytes Relative: 6.7 % (ref 3.0–12.0)
NEUTROS ABS: 3.7 10*3/uL (ref 1.4–7.7)
NEUTROS PCT: 48.4 % (ref 43.0–77.0)
PLATELETS: 312 10*3/uL (ref 150.0–400.0)
RBC: 4.64 Mil/uL (ref 3.87–5.11)
RDW: 13 % (ref 11.5–15.5)
WBC: 7.7 10*3/uL (ref 4.0–10.5)

## 2017-03-16 LAB — TSH: TSH: 2.78 u[IU]/mL (ref 0.35–4.50)

## 2017-03-16 LAB — LIPID PANEL
CHOL/HDL RATIO: 3
Cholesterol: 237 mg/dL — ABNORMAL HIGH (ref 0–200)
HDL: 70.6 mg/dL (ref >39.00)
LDL Cholesterol: 151 mg/dL — ABNORMAL HIGH (ref 0–99)
NONHDL: 166.26
Triglycerides: 75 mg/dL (ref 0.0–149.0)
VLDL: 15 mg/dL (ref 0.0–40.0)

## 2017-03-16 LAB — COMPREHENSIVE METABOLIC PANEL
ALT: 12 U/L (ref 0–35)
AST: 15 U/L (ref 0–37)
Albumin: 4.3 g/dL (ref 3.5–5.2)
Alkaline Phosphatase: 64 U/L (ref 39–117)
BUN: 11 mg/dL (ref 6–23)
CHLORIDE: 101 meq/L (ref 96–112)
CO2: 29 meq/L (ref 19–32)
Calcium: 10.1 mg/dL (ref 8.4–10.5)
Creatinine, Ser: 0.78 mg/dL (ref 0.40–1.20)
GFR: 75.28 mL/min (ref >60.00)
GLUCOSE: 100 mg/dL — AB (ref 70–99)
POTASSIUM: 4.1 meq/L (ref 3.5–5.1)
Sodium: 137 mEq/L (ref 135–145)
Total Bilirubin: 0.5 mg/dL (ref 0.2–1.2)
Total Protein: 7.5 g/dL (ref 6.0–8.3)

## 2017-03-18 ENCOUNTER — Encounter: Payer: Self-pay | Admitting: Internal Medicine

## 2017-03-20 DIAGNOSIS — M545 Low back pain: Secondary | ICD-10-CM | POA: Diagnosis not present

## 2017-03-20 DIAGNOSIS — M9904 Segmental and somatic dysfunction of sacral region: Secondary | ICD-10-CM | POA: Diagnosis not present

## 2017-03-20 DIAGNOSIS — M9903 Segmental and somatic dysfunction of lumbar region: Secondary | ICD-10-CM | POA: Diagnosis not present

## 2017-03-22 DIAGNOSIS — M9904 Segmental and somatic dysfunction of sacral region: Secondary | ICD-10-CM | POA: Diagnosis not present

## 2017-03-22 DIAGNOSIS — M545 Low back pain: Secondary | ICD-10-CM | POA: Diagnosis not present

## 2017-03-22 DIAGNOSIS — M9903 Segmental and somatic dysfunction of lumbar region: Secondary | ICD-10-CM | POA: Diagnosis not present

## 2017-03-27 DIAGNOSIS — M9903 Segmental and somatic dysfunction of lumbar region: Secondary | ICD-10-CM | POA: Diagnosis not present

## 2017-03-27 DIAGNOSIS — M9904 Segmental and somatic dysfunction of sacral region: Secondary | ICD-10-CM | POA: Diagnosis not present

## 2017-03-27 DIAGNOSIS — M545 Low back pain: Secondary | ICD-10-CM | POA: Diagnosis not present

## 2017-03-28 DIAGNOSIS — H25812 Combined forms of age-related cataract, left eye: Secondary | ICD-10-CM | POA: Diagnosis not present

## 2017-03-28 DIAGNOSIS — H2512 Age-related nuclear cataract, left eye: Secondary | ICD-10-CM | POA: Diagnosis not present

## 2017-03-28 DIAGNOSIS — H52202 Unspecified astigmatism, left eye: Secondary | ICD-10-CM | POA: Diagnosis not present

## 2017-03-28 DIAGNOSIS — H25012 Cortical age-related cataract, left eye: Secondary | ICD-10-CM | POA: Diagnosis not present

## 2017-04-04 ENCOUNTER — Other Ambulatory Visit (HOSPITAL_COMMUNITY): Payer: Medicare Other

## 2017-04-07 DIAGNOSIS — M545 Low back pain: Secondary | ICD-10-CM | POA: Diagnosis not present

## 2017-04-07 DIAGNOSIS — M9904 Segmental and somatic dysfunction of sacral region: Secondary | ICD-10-CM | POA: Diagnosis not present

## 2017-04-07 DIAGNOSIS — M9903 Segmental and somatic dysfunction of lumbar region: Secondary | ICD-10-CM | POA: Diagnosis not present

## 2017-04-10 ENCOUNTER — Telehealth: Payer: Self-pay | Admitting: Internal Medicine

## 2017-04-10 ENCOUNTER — Other Ambulatory Visit: Payer: Self-pay | Admitting: Internal Medicine

## 2017-04-10 DIAGNOSIS — H04122 Dry eye syndrome of left lacrimal gland: Secondary | ICD-10-CM | POA: Diagnosis not present

## 2017-04-10 DIAGNOSIS — H18892 Other specified disorders of cornea, left eye: Secondary | ICD-10-CM | POA: Diagnosis not present

## 2017-04-10 MED ORDER — ZOSTER VAC RECOMB ADJUVANTED 50 MCG/0.5ML IM SUSR: 0.5000 mL | Freq: Once | INTRAMUSCULAR | 1 refills | Status: AC

## 2017-04-10 NOTE — Telephone Encounter (Signed)
Called and left a VM for the patient to call back to reschedule her echo.

## 2017-04-11 ENCOUNTER — Telehealth: Payer: Self-pay | Admitting: Internal Medicine

## 2017-04-11 NOTE — Telephone Encounter (Signed)
Called patient to schedule her echo.  She had stated she had just had her first cataract surgery recently and is due to have the second done in the next few weeks.  She is not interested in scheduling her echo for a few months.

## 2017-04-12 DIAGNOSIS — M545 Low back pain: Secondary | ICD-10-CM | POA: Diagnosis not present

## 2017-04-12 DIAGNOSIS — M9903 Segmental and somatic dysfunction of lumbar region: Secondary | ICD-10-CM | POA: Diagnosis not present

## 2017-04-12 DIAGNOSIS — M9904 Segmental and somatic dysfunction of sacral region: Secondary | ICD-10-CM | POA: Diagnosis not present

## 2017-04-14 DIAGNOSIS — H18892 Other specified disorders of cornea, left eye: Secondary | ICD-10-CM | POA: Diagnosis not present

## 2017-04-21 DIAGNOSIS — M9904 Segmental and somatic dysfunction of sacral region: Secondary | ICD-10-CM | POA: Diagnosis not present

## 2017-04-21 DIAGNOSIS — M9903 Segmental and somatic dysfunction of lumbar region: Secondary | ICD-10-CM | POA: Diagnosis not present

## 2017-04-21 DIAGNOSIS — M545 Low back pain: Secondary | ICD-10-CM | POA: Diagnosis not present

## 2017-04-28 DIAGNOSIS — M9903 Segmental and somatic dysfunction of lumbar region: Secondary | ICD-10-CM | POA: Diagnosis not present

## 2017-04-28 DIAGNOSIS — M545 Low back pain: Secondary | ICD-10-CM | POA: Diagnosis not present

## 2017-04-28 DIAGNOSIS — M9904 Segmental and somatic dysfunction of sacral region: Secondary | ICD-10-CM | POA: Diagnosis not present

## 2017-05-05 DIAGNOSIS — Z23 Encounter for immunization: Secondary | ICD-10-CM | POA: Diagnosis not present

## 2017-05-19 ENCOUNTER — Telehealth: Payer: Self-pay | Admitting: Internal Medicine

## 2017-05-19 MED ORDER — HYDROCHLOROTHIAZIDE 25 MG PO TABS: 12.5000 mg | ORAL_TABLET | Freq: Every day | ORAL | 1 refills | Status: DC

## 2017-05-19 MED ORDER — AMLODIPINE BESYLATE 10 MG PO TABS: 10.0000 mg | ORAL_TABLET | Freq: Every day | ORAL | 1 refills | Status: DC

## 2017-05-19 NOTE — Telephone Encounter (Signed)
The patient has been notified rx's sent to [facility]. Updated pharmacy...Renee Figueroa

## 2017-05-19 NOTE — Telephone Encounter (Signed)
Pt called and said that she is changing from mail service and wants all scripts sent to [facility]   Amplodipine  Hydrochlorothiazide   She said she only has 2 pills and needs bp meds asap

## 2017-05-30 DIAGNOSIS — H25011 Cortical age-related cataract, right eye: Secondary | ICD-10-CM | POA: Diagnosis not present

## 2017-05-30 DIAGNOSIS — H2511 Age-related nuclear cataract, right eye: Secondary | ICD-10-CM | POA: Diagnosis not present

## 2017-05-30 DIAGNOSIS — H25811 Combined forms of age-related cataract, right eye: Secondary | ICD-10-CM | POA: Diagnosis not present

## 2017-06-07 ENCOUNTER — Encounter: Payer: Self-pay | Admitting: Internal Medicine

## 2017-06-07 ENCOUNTER — Ambulatory Visit (INDEPENDENT_AMBULATORY_CARE_PROVIDER_SITE_OTHER): Payer: Medicare Other | Admitting: Internal Medicine

## 2017-06-07 VITALS — BP 168/94 | HR 99 | Temp 99.2°F | Resp 18 | Wt 114.0 lb

## 2017-06-07 DIAGNOSIS — J069 Acute upper respiratory infection, unspecified: Secondary | ICD-10-CM | POA: Diagnosis not present

## 2017-06-07 DIAGNOSIS — R011 Cardiac murmur, unspecified: Secondary | ICD-10-CM

## 2017-06-07 MED ORDER — AMOXICILLIN-POT CLAVULANATE 875-125 MG PO TABS: 1.0000 | ORAL_TABLET | Freq: Two times a day (BID) | ORAL | 0 refills | Status: DC

## 2017-06-07 NOTE — Assessment & Plan Note (Signed)
Likely viral in nature, but difficult to say given the short duration of her symptoms She does have a history of bronchitis Advised her that this may be viral and ideally we should avoid antibiotics-encouraged her to take immediately and see if her symptoms improve Discussed over-the-counter symptomatic treatment Increase rest and fluids Call if no improvement

## 2017-06-07 NOTE — Progress Notes (Signed)
Subjective:    Patient ID: Renee Figueroa, female    DOB: 1933-09-05, 83 y.o.   MRN: [id number]  HPI She is here for an acute visit for cold symptoms.   Her symptoms started yesterday and have worsened quickly.  She is experiencing Fatigue, low-grade fevers, nasal congestion, dry cough, hoarseness, sore throat and overall feels miserable. Her husband has been sick recently. She denies any shortness of breath, wheezing, diarrhea, nausea, body aches and headaches..  She has not tried taking cold medication because she was not sure what to take.  She does have a history of bronchitis and is concerned she is developing it. Her cough is quite deep.    Medications and allergies reviewed with patient and updated if appropriate.  Patient Active Problem List   Diagnosis Date Noted  . Heart murmur 02/12/2016  . COLONIC POLYPS, ADENOMATOUS 01/19/2010  . RHINITIS 01/19/2010  . DEGENERATIVE JOINT DISEASE 01/19/2010  . SKIN CANCER, HX OF 01/19/2010  . Hyperlipidemia 07/27/2007  . Essential hypertension 07/27/2007  . Osteoporosis 07/27/2007    Current Outpatient Prescriptions on File Prior to Visit  Medication Sig Dispense Refill  . amLODipine (NORVASC) 10 MG tablet Take 1 tablet (10 mg total) by mouth daily. 90 tablet 1  . Cetirizine HCl (ZYRTEC ALLERGY) 10 MG CAPS Zyrtec 10 mg capsule  Take 1 capsule every day by oral route.    . hydrochlorothiazide (HYDRODIURIL) 25 MG tablet Take 0.5 tablets (12.5 mg total) by mouth daily. refills 45 tablet 1   No current facility-administered medications on file prior to visit.     Past Medical History:  Diagnosis Date  . Allergy    seasonal  . Arthritis    mild  . Basal cell carcinoma of anterior chest    skin  . Colon polyps    adenomatous  . Diverticulosis   . Heart murmur   . Herpes zoster 04/2011   C 6  LUE  . Hyperlipidemia   . Hypertension   . Osteoporosis   . Renal calculi 1962    X 1     Past Surgical History:  Procedure  Laterality Date  . colonoscopy with polypectomy  2004    adenomatous; Dr Henrene Pastor. Neg 2013  . DILATION AND CURETTAGE OF UTERUS     age 89  . G 2 P 2    . TONSILLECTOMY AND ADENOIDECTOMY      Social History   Social History  . Marital status: Married    Spouse name: N/A  . Number of children: N/A  . Years of education: N/A   Social History Main Topics  . Smoking status: Never Smoker  . Smokeless tobacco: Never Used  . Alcohol use 0.0 oz/week     Comment: maybe 1 drink a month  . Drug use: No  . Sexual activity: Not Asked   Other Topics Concern  . None   Social History Narrative  . None    Family History  Problem Relation Age of Onset  . Hypertension Mother   . Stomach cancer Mother        died @ 68  . Heart attack Father 56  . Colon cancer Neg Hx   . Colon polyps Neg Hx   . Rectal cancer Neg Hx   . Diabetes Neg Hx   . Stroke Neg Hx     Review of Systems  Constitutional: Positive for fatigue. Negative for appetite change, chills and fever (low grade fever today).  HENT:  Positive for congestion (mild), sore throat (this morning) and voice change. Negative for ear pain and sinus pain.   Respiratory: Positive for cough (dry cough). Negative for shortness of breath and wheezing.   Gastrointestinal: Negative for diarrhea and nausea.  Musculoskeletal: Negative for myalgias.  Neurological: Negative for light-headedness and headaches.       Objective:   Vitals:   06/07/17 1458  BP: (!) 168/94  Pulse: 99  Resp: 18  Temp: 99.2 F (37.3 C)  SpO2: 96%   Wt Readings from Last 3 Encounters:  06/07/17 114 lb (51.7 kg)  02/13/17 114 lb (51.7 kg)  10/06/16 113 lb (51.3 kg)   Body mass index is 23.03 kg/m.   Physical Exam    GENERAL APPEARANCE: Appears stated age, well appearing, NAD EYES: conjunctiva clear, no icterus HEENT: bilateral tympanic membranes and ear canals normal, oropharynx with mild erythema, no thyromegaly, trachea midline, no cervical or  supraclavicular lymphadenopathy LUNGS: Clear to auscultation without wheeze or crackles, unlabored breathing, good air entry bilaterally HEART: Normal S1,S2 without murmurs EXTREMITIES: Without clubbing, cyanosis, or edema     Assessment & Plan:    See Problem List for Assessment and Plan of chronic medical problems.

## 2017-06-07 NOTE — Assessment & Plan Note (Signed)
Cardiology did call her to schedule her echocardiogram, which she was about to have cataract surgery and did not schedule it. She was most call back, but has not done that yet. I will reorder the test so that they call her to schedule it. She has had the murmur since birth, but there has been no evaluation in recent years.

## 2017-06-07 NOTE — Patient Instructions (Signed)
Take the Augmentin as prescribed.  Take over the counter cold medications like Coricidin cold products.    Call if no improvement    Schedule your Echo

## 2017-06-14 DIAGNOSIS — Z1231 Encounter for screening mammogram for malignant neoplasm of breast: Secondary | ICD-10-CM | POA: Diagnosis not present

## 2017-06-14 DIAGNOSIS — N819 Female genital prolapse, unspecified: Secondary | ICD-10-CM | POA: Diagnosis not present

## 2017-06-14 LAB — HM MAMMOGRAPHY

## 2017-07-05 ENCOUNTER — Other Ambulatory Visit (HOSPITAL_COMMUNITY): Payer: Medicare Other

## 2017-08-08 DIAGNOSIS — M545 Low back pain: Secondary | ICD-10-CM | POA: Diagnosis not present

## 2017-08-08 DIAGNOSIS — M9904 Segmental and somatic dysfunction of sacral region: Secondary | ICD-10-CM | POA: Diagnosis not present

## 2017-08-08 DIAGNOSIS — M9903 Segmental and somatic dysfunction of lumbar region: Secondary | ICD-10-CM | POA: Diagnosis not present

## 2017-08-29 DIAGNOSIS — M9903 Segmental and somatic dysfunction of lumbar region: Secondary | ICD-10-CM | POA: Diagnosis not present

## 2017-08-29 DIAGNOSIS — M9904 Segmental and somatic dysfunction of sacral region: Secondary | ICD-10-CM | POA: Diagnosis not present

## 2017-08-29 DIAGNOSIS — M545 Low back pain: Secondary | ICD-10-CM | POA: Diagnosis not present

## 2017-08-30 ENCOUNTER — Other Ambulatory Visit (HOSPITAL_COMMUNITY): Payer: Medicare Other

## 2017-08-31 ENCOUNTER — Encounter: Payer: Self-pay | Admitting: Internal Medicine

## 2017-09-04 DIAGNOSIS — M5442 Lumbago with sciatica, left side: Secondary | ICD-10-CM | POA: Diagnosis not present

## 2017-09-04 DIAGNOSIS — M9903 Segmental and somatic dysfunction of lumbar region: Secondary | ICD-10-CM | POA: Diagnosis not present

## 2017-09-04 DIAGNOSIS — M9902 Segmental and somatic dysfunction of thoracic region: Secondary | ICD-10-CM | POA: Diagnosis not present

## 2017-09-04 DIAGNOSIS — M546 Pain in thoracic spine: Secondary | ICD-10-CM | POA: Diagnosis not present

## 2017-09-08 ENCOUNTER — Telehealth (HOSPITAL_COMMUNITY): Payer: Self-pay | Admitting: Internal Medicine

## 2017-09-08 DIAGNOSIS — M9903 Segmental and somatic dysfunction of lumbar region: Secondary | ICD-10-CM | POA: Diagnosis not present

## 2017-09-08 DIAGNOSIS — M5442 Lumbago with sciatica, left side: Secondary | ICD-10-CM | POA: Diagnosis not present

## 2017-09-08 DIAGNOSIS — M546 Pain in thoracic spine: Secondary | ICD-10-CM | POA: Diagnosis not present

## 2017-09-08 DIAGNOSIS — M9902 Segmental and somatic dysfunction of thoracic region: Secondary | ICD-10-CM | POA: Diagnosis not present

## 2017-09-08 NOTE — Telephone Encounter (Signed)
03/13/17 cancelled less than 24 hrs before appt. 04/04/17 No showed for appt. 07/05/17 cancelled due to other dr. appt. 08/30/17 no showed for appt  09/05/17 sent in-basket to Dr. Quay Burow to remove patient EVD

## 2017-09-14 ENCOUNTER — Encounter: Payer: Self-pay | Admitting: Internal Medicine

## 2017-09-14 DIAGNOSIS — M5442 Lumbago with sciatica, left side: Secondary | ICD-10-CM | POA: Diagnosis not present

## 2017-09-14 DIAGNOSIS — M9902 Segmental and somatic dysfunction of thoracic region: Secondary | ICD-10-CM | POA: Diagnosis not present

## 2017-09-14 DIAGNOSIS — M546 Pain in thoracic spine: Secondary | ICD-10-CM | POA: Diagnosis not present

## 2017-09-14 DIAGNOSIS — M9903 Segmental and somatic dysfunction of lumbar region: Secondary | ICD-10-CM | POA: Diagnosis not present

## 2017-09-21 DIAGNOSIS — M9903 Segmental and somatic dysfunction of lumbar region: Secondary | ICD-10-CM | POA: Diagnosis not present

## 2017-09-21 DIAGNOSIS — M5442 Lumbago with sciatica, left side: Secondary | ICD-10-CM | POA: Diagnosis not present

## 2017-09-21 DIAGNOSIS — M546 Pain in thoracic spine: Secondary | ICD-10-CM | POA: Diagnosis not present

## 2017-09-21 DIAGNOSIS — M9902 Segmental and somatic dysfunction of thoracic region: Secondary | ICD-10-CM | POA: Diagnosis not present

## 2017-09-28 DIAGNOSIS — M9903 Segmental and somatic dysfunction of lumbar region: Secondary | ICD-10-CM | POA: Diagnosis not present

## 2017-09-28 DIAGNOSIS — M9902 Segmental and somatic dysfunction of thoracic region: Secondary | ICD-10-CM | POA: Diagnosis not present

## 2017-09-28 DIAGNOSIS — M546 Pain in thoracic spine: Secondary | ICD-10-CM | POA: Diagnosis not present

## 2017-09-28 DIAGNOSIS — M5442 Lumbago with sciatica, left side: Secondary | ICD-10-CM | POA: Diagnosis not present

## 2017-10-04 DIAGNOSIS — M5442 Lumbago with sciatica, left side: Secondary | ICD-10-CM | POA: Diagnosis not present

## 2017-10-04 DIAGNOSIS — M546 Pain in thoracic spine: Secondary | ICD-10-CM | POA: Diagnosis not present

## 2017-10-04 DIAGNOSIS — M9903 Segmental and somatic dysfunction of lumbar region: Secondary | ICD-10-CM | POA: Diagnosis not present

## 2017-10-04 DIAGNOSIS — M9902 Segmental and somatic dysfunction of thoracic region: Secondary | ICD-10-CM | POA: Diagnosis not present

## 2017-10-11 DIAGNOSIS — M9902 Segmental and somatic dysfunction of thoracic region: Secondary | ICD-10-CM | POA: Diagnosis not present

## 2017-10-11 DIAGNOSIS — M5442 Lumbago with sciatica, left side: Secondary | ICD-10-CM | POA: Diagnosis not present

## 2017-10-11 DIAGNOSIS — M546 Pain in thoracic spine: Secondary | ICD-10-CM | POA: Diagnosis not present

## 2017-10-11 DIAGNOSIS — M9903 Segmental and somatic dysfunction of lumbar region: Secondary | ICD-10-CM | POA: Diagnosis not present

## 2017-10-14 ENCOUNTER — Other Ambulatory Visit: Payer: Self-pay | Admitting: Internal Medicine

## 2017-10-18 DIAGNOSIS — M546 Pain in thoracic spine: Secondary | ICD-10-CM | POA: Diagnosis not present

## 2017-10-18 DIAGNOSIS — M9903 Segmental and somatic dysfunction of lumbar region: Secondary | ICD-10-CM | POA: Diagnosis not present

## 2017-10-18 DIAGNOSIS — M9902 Segmental and somatic dysfunction of thoracic region: Secondary | ICD-10-CM | POA: Diagnosis not present

## 2017-10-18 DIAGNOSIS — M5442 Lumbago with sciatica, left side: Secondary | ICD-10-CM | POA: Diagnosis not present

## 2017-10-24 ENCOUNTER — Other Ambulatory Visit: Payer: Self-pay | Admitting: Internal Medicine

## 2017-10-30 ENCOUNTER — Other Ambulatory Visit: Payer: Self-pay | Admitting: Internal Medicine

## 2017-12-06 DIAGNOSIS — N819 Female genital prolapse, unspecified: Secondary | ICD-10-CM | POA: Diagnosis not present

## 2018-01-03 ENCOUNTER — Other Ambulatory Visit: Payer: Self-pay | Admitting: Internal Medicine

## 2018-01-22 ENCOUNTER — Other Ambulatory Visit: Payer: Self-pay | Admitting: Internal Medicine

## 2018-02-13 NOTE — Progress Notes (Addendum)
Subjective:   Renee Figueroa is a 84 y.o. female who presents for Medicare Annual (Subsequent) preventive examination.  Review of Systems:  No ROS.  Medicare Wellness Visit. Additional risk factors are reflected in the social history.  Cardiac Risk Factors include: advanced age (>22men, >68 women);hypertension;dyslipidemia Sleep patterns: feels rested on waking, gets up 1 times nightly to void and sleeps 7-8 hours nightly.    Home Safety/Smoke Alarms: Feels safe in home. Smoke alarms in place.  Living environment; residence and Firearm Safety: 2-story house, no firearms. Lives with husband, no needs for DME, good support system Seat Belt Safety/Bike Helmet: Wears seat belt.      Objective:     Vitals: BP (!) 146/66   Pulse 82   Temp 97.9 F (36.6 C)   Resp 18   Ht 4\' 11"  (1.499 m)   Wt 113 lb (51.3 kg)   SpO2 95%   BMI 22.82 kg/m   Body mass index is 22.82 kg/m.  Advanced Directives 02/14/2018 10/02/2016  Does Patient Have a Medical Advance Directive? Yes No  Type of Paramedic of [place];Living will -  Copy of [place] in Chart? No - copy requested -  Would patient like information on creating a medical advance directive? - No - Patient declined    Tobacco Social History   Tobacco Use  Smoking Status Never Smoker  Smokeless Tobacco Never Used     Counseling given: Not Answered  Past Medical History:  Diagnosis Date  . Allergy    seasonal  . Arthritis    mild  . Basal cell carcinoma of anterior chest    skin  . Colon polyps    adenomatous  . Diverticulosis   . Heart murmur   . Herpes zoster 04/2011   C 6  LUE  . Hyperlipidemia   . Hypertension   . Osteoporosis   . Renal calculi 1962    X 1    Past Surgical History:  Procedure Laterality Date  . colonoscopy with polypectomy  2004    adenomatous; Dr Henrene Pastor. Neg 2013  . DILATION AND CURETTAGE OF UTERUS     age 89  . G 2 P 2    . TONSILLECTOMY AND  ADENOIDECTOMY     Family History  Problem Relation Age of Onset  . Hypertension Mother   . Stomach cancer Mother        died @ 41  . Heart attack Father 51  . Colon cancer Neg Hx   . Colon polyps Neg Hx   . Rectal cancer Neg Hx   . Diabetes Neg Hx   . Stroke Neg Hx    Social History   Socioeconomic History  . Marital status: Married    Spouse name: Not on file  . Number of children: 2  . Years of education: Not on file  . Highest education level: Not on file  Occupational History  . Not on file  Social Needs  . Financial resource strain: Not hard at all  . Food insecurity:    Worry: Never true    Inability: Never true  . Transportation needs:    Medical: No    Non-medical: No  Tobacco Use  . Smoking status: Never Smoker  . Smokeless tobacco: Never Used  Substance and Sexual Activity  . Alcohol use: Yes    Comment: maybe 1 drink a month  . Drug use: No  . Sexual activity: Not Currently  Lifestyle  .  Physical activity:    Days per week: 4 days    Minutes per session: 30 min  . Stress: Only a little  Relationships  . Social connections:    Talks on phone: More than three times a week    Gets together: More than three times a week    Attends religious service: More than 4 times per year    Active member of club or organization: Not on file    Attends meetings of clubs or organizations: More than 4 times per year    Relationship status: Married  Other Topics Concern  . Not on file  Social History Narrative  . Not on file    Outpatient Encounter Medications as of 02/14/2018  Medication Sig  . Cetirizine HCl (ZYRTEC ALLERGY) 10 MG CAPS Zyrtec 10 mg capsule  Take 1 capsule every day by oral route.  . [DISCONTINUED] amLODipine (NORVASC) 10 MG tablet Take 1 tablet (10 mg total) by mouth daily. -- Office visit needed for further refills  . [DISCONTINUED] hydrochlorothiazide (HYDRODIURIL) 25 MG tablet Take 0.5 tablets (12.5 mg total) by mouth daily. -- Office visit  needed for further refills  . [DISCONTINUED] amoxicillin-clavulanate (AUGMENTIN) 875-125 MG tablet Take 1 tablet by mouth 2 (two) times daily.   No facility-administered encounter medications on file as of 02/14/2018.     Activities of Daily Living In your present state of health, do you have any difficulty performing the following activities: 02/14/2018  Hearing? N  Vision? N  Difficulty concentrating or making decisions? N  Walking or climbing stairs? N  Dressing or bathing? N  Doing errands, shopping? N  Preparing Food and eating ? N  Using the Toilet? N  In the past six months, have you accidently leaked urine? Y  Comment followed by Dr. Deatra Ina  Do you have problems with loss of bowel control? N  Managing your Medications? N  Managing your Finances? N  Housekeeping or managing your Housekeeping? N  Some recent data might be hidden    Patient Care Team: Binnie Rail, MD as PCP - General (Internal Medicine) Alden Hipp, MD as Consulting Physician (Obstetrics and Gynecology)    Assessment:   This is a routine wellness examination for Renee Figueroa. Physical assessment deferred to PCP.   Exercise Activities and Dietary recommendations Current Exercise Habits: Home exercise routine, Type of exercise: walking, Time (Minutes): 30, Frequency (Times/Week): 5, Weekly Exercise (Minutes/Week): 150, Intensity: Mild, Exercise limited by: orthopedic condition(s)  Diet (meal preparation, eat out, water intake, caffeinated beverages, dairy products, fruits and vegetables): in general, a "healthy" diet  , well balanced, eats a variety of fruits and vegetables daily, limits salt, fat/cholesterol, sugar,carbohydrates,caffeine, drinks 6-8 glasses of water daily.  Goals    . Patient Stated     Continue to love life, and family. Stay healthy and as independent as possible. Continue to eat healthy and exercise.        Fall Risk Fall Risk  02/14/2018 10/24/2012  Falls in the past year? No No     Depression Screen PHQ 2/9 Scores 02/14/2018 10/24/2012  PHQ - 2 Score 0 0     Cognitive Function MMSE - Mini Mental State Exam 02/14/2018  Orientation to time 5  Orientation to Place 5  Registration 3  Attention/ Calculation 5  Recall 2  Language- name 2 objects 2  Language- repeat 1  Language- follow 3 step command 3  Language- read & follow direction 1  Write a sentence 1  Copy  design 1  Total score 29        Immunization History  Administered Date(s) Administered  . H1N1 08/27/2008  . Influenza Split 06/09/2011, 06/07/2012  . Influenza Whole 05/26/2009, 05/19/2010  . Influenza, High Dose Seasonal PF 05/24/2016  . Influenza,inj,Quad PF,6+ Mos 06/11/2013, 05/21/2014, 05/22/2015  . Influenza-Unspecified 05/02/2017  . Pneumococcal Conjugate-13 07/15/2015  . Pneumococcal Polysaccharide-23 06/22/2002  . Tdap 10/24/2012  . Zoster 11/06/2012   Screening Tests Health Maintenance  Topic Date Due  . DEXA SCAN  01/08/2015  . INFLUENZA VACCINE  03/22/2018  . TETANUS/TDAP  10/25/2022  . PNA vac Low Risk Adult  Completed      Plan:     Continue doing brain stimulating activities (puzzles, reading, adult coloring books, staying active) to keep memory sharp.   Continue to eat heart healthy diet (full of fruits, vegetables, whole grains, lean protein, water--limit salt, fat, and sugar intake) and increase physical activity as tolerated.   I have personally reviewed and noted the following in the patient's chart:   . Medical and social history . Use of alcohol, tobacco or illicit drugs  . Current medications and supplements . Functional ability and status . Nutritional status . Physical activity . Advanced directives . List of other physicians . Vitals . Screenings to include cognitive, depression, and falls . Referrals and appointments  In addition, I have reviewed and discussed with patient certain preventive protocols, quality metrics, and best practice  recommendations. A written personalized care plan for preventive services as well as general preventive health recommendations were provided to patient.     Michiel Cowboy, RN  02/14/2018   Medical screening examination/treatment/procedure(s) were performed by non-physician practitioner and as supervising physician I was immediately available for consultation/collaboration. I agree with above. Binnie Rail, MD

## 2018-02-14 ENCOUNTER — Ambulatory Visit (INDEPENDENT_AMBULATORY_CARE_PROVIDER_SITE_OTHER): Payer: Medicare Other | Admitting: *Deleted

## 2018-02-14 ENCOUNTER — Encounter: Payer: Self-pay | Admitting: Internal Medicine

## 2018-02-14 ENCOUNTER — Other Ambulatory Visit (INDEPENDENT_AMBULATORY_CARE_PROVIDER_SITE_OTHER): Payer: Medicare Other

## 2018-02-14 ENCOUNTER — Ambulatory Visit (INDEPENDENT_AMBULATORY_CARE_PROVIDER_SITE_OTHER): Payer: Medicare Other | Admitting: Internal Medicine

## 2018-02-14 VITALS — BP 146/66 | HR 82 | Temp 97.9°F | Resp 18 | Ht 59.0 in | Wt 113.0 lb

## 2018-02-14 VITALS — BP 146/66 | HR 88 | Temp 97.9°F | Resp 16 | Ht 59.0 in | Wt 113.0 lb

## 2018-02-14 DIAGNOSIS — E2839 Other primary ovarian failure: Secondary | ICD-10-CM | POA: Diagnosis not present

## 2018-02-14 DIAGNOSIS — Z0000 Encounter for general adult medical examination without abnormal findings: Secondary | ICD-10-CM

## 2018-02-14 DIAGNOSIS — J301 Allergic rhinitis due to pollen: Secondary | ICD-10-CM

## 2018-02-14 DIAGNOSIS — I10 Essential (primary) hypertension: Secondary | ICD-10-CM | POA: Diagnosis not present

## 2018-02-14 DIAGNOSIS — E782 Mixed hyperlipidemia: Secondary | ICD-10-CM

## 2018-02-14 DIAGNOSIS — Z974 Presence of external hearing-aid: Secondary | ICD-10-CM | POA: Diagnosis not present

## 2018-02-14 DIAGNOSIS — M810 Age-related osteoporosis without current pathological fracture: Secondary | ICD-10-CM

## 2018-02-14 DIAGNOSIS — R011 Cardiac murmur, unspecified: Secondary | ICD-10-CM | POA: Diagnosis not present

## 2018-02-14 LAB — CBC WITH DIFFERENTIAL/PLATELET
BASOS ABS: 0.1 10*3/uL (ref 0.0–0.1)
Basophils Relative: 0.6 % (ref 0.0–3.0)
EOS PCT: 1.2 % (ref 0.0–5.0)
Eosinophils Absolute: 0.1 10*3/uL (ref 0.0–0.7)
HCT: 44.1 % (ref 36.0–46.0)
Hemoglobin: 15.2 g/dL — ABNORMAL HIGH (ref 12.0–15.0)
LYMPHS ABS: 4.2 10*3/uL — AB (ref 0.7–4.0)
Lymphocytes Relative: 46.1 % — ABNORMAL HIGH (ref 12.0–46.0)
MCHC: 34.4 g/dL (ref 30.0–36.0)
MCV: 90.6 fl (ref 78.0–100.0)
MONO ABS: 0.5 10*3/uL (ref 0.1–1.0)
MONOS PCT: 4.9 % (ref 3.0–12.0)
NEUTROS ABS: 4.3 10*3/uL (ref 1.4–7.7)
Neutrophils Relative %: 47.2 % (ref 43.0–77.0)
PLATELETS: 320 10*3/uL (ref 150.0–400.0)
RBC: 4.87 Mil/uL (ref 3.87–5.11)
RDW: 12.9 % (ref 11.5–15.5)
WBC: 9.2 10*3/uL (ref 4.0–10.5)

## 2018-02-14 LAB — LIPID PANEL
CHOL/HDL RATIO: 4
Cholesterol: 269 mg/dL — ABNORMAL HIGH (ref 0–200)
HDL: 75.9 mg/dL (ref >39.00)
LDL Cholesterol: 173 mg/dL — ABNORMAL HIGH (ref 0–99)
NONHDL: 193.44
Triglycerides: 104 mg/dL (ref 0.0–149.0)
VLDL: 20.8 mg/dL (ref 0.0–40.0)

## 2018-02-14 LAB — COMPREHENSIVE METABOLIC PANEL
ALT: 15 U/L (ref 0–35)
AST: 19 U/L (ref 0–37)
Albumin: 4.7 g/dL (ref 3.5–5.2)
Alkaline Phosphatase: 69 U/L (ref 39–117)
BILIRUBIN TOTAL: 0.4 mg/dL (ref 0.2–1.2)
BUN: 13 mg/dL (ref 6–23)
CO2: 27 meq/L (ref 19–32)
Calcium: 10.3 mg/dL (ref 8.4–10.5)
Chloride: 99 mEq/L (ref 96–112)
Creatinine, Ser: 0.7 mg/dL (ref 0.40–1.20)
GFR: 85.1 mL/min (ref >60.00)
GLUCOSE: 106 mg/dL — AB (ref 70–99)
Potassium: 3.4 mEq/L — ABNORMAL LOW (ref 3.5–5.1)
SODIUM: 136 meq/L (ref 135–145)
Total Protein: 7.9 g/dL (ref 6.0–8.3)

## 2018-02-14 LAB — TSH: TSH: 2.47 u[IU]/mL (ref 0.35–4.50)

## 2018-02-14 MED ORDER — HYDROCHLOROTHIAZIDE 25 MG PO TABS: 12.5000 mg | ORAL_TABLET | Freq: Every day | ORAL | 3 refills | Status: DC

## 2018-02-14 MED ORDER — AMLODIPINE BESYLATE 10 MG PO TABS: 10.0000 mg | ORAL_TABLET | Freq: Every day | ORAL | 3 refills | Status: DC

## 2018-02-14 NOTE — Assessment & Plan Note (Signed)
Grade 3/6 systolic murmur Echocardiograms have been ordered in the past twice and have not been done Discussed with her ordering an echocardiogram today to evaluate what valve is leaky and to make sure this will not cause problems such as heart failure in the near future She is asymptomatic She would like to hold off on echocardiogram for now

## 2018-02-14 NOTE — Patient Instructions (Addendum)
Continue doing brain stimulating activities (puzzles, reading, adult coloring books, staying active) to keep memory sharp.   Continue to eat heart healthy diet (full of fruits, vegetables, whole grains, lean protein, water--limit salt, fat, and sugar intake) and increase physical activity as tolerated.   Renee Figueroa , Thank you for taking time to come for your Medicare Wellness Visit. I appreciate your ongoing commitment to your health goals. Please review the following plan we discussed and let me know if I can assist you in the future.   These are the goals we discussed: Goals    . Patient Stated     Continue to love life, and family. Stay healthy and as independent as possible. Continue to eat healthy and exercise.        This is a list of the screening recommended for you and due dates:  Health Maintenance  Topic Date Due  . DEXA scan (bone density measurement)  01/08/2015  . Flu Shot  03/22/2018  . Tetanus Vaccine  10/25/2022  . Pneumonia vaccines  Completed   Health Maintenance, Female Adopting a healthy lifestyle and getting preventive care can go a long way to promote health and wellness. Talk with your health care provider about what schedule of regular examinations is right for you. This is a good chance for you to check in with your provider about disease prevention and staying healthy. In between checkups, there are plenty of things you can do on your own. Experts have done a lot of research about which lifestyle changes and preventive measures are most likely to keep you healthy. Ask your health care provider for more information. Weight and diet Eat a healthy diet  Be sure to include plenty of vegetables, fruits, low-fat dairy products, and lean protein.  Do not eat a lot of foods high in solid fats, added sugars, or salt.  Get regular exercise. This is one of the most important things you can do for your health. ? Most adults should exercise for at least 150 minutes  each week. The exercise should increase your heart rate and make you sweat (moderate-intensity exercise). ? Most adults should also do strengthening exercises at least twice a week. This is in addition to the moderate-intensity exercise.  Maintain a healthy weight  Body mass index (BMI) is a measurement that can be used to identify possible weight problems. It estimates body fat based on height and weight. Your health care provider can help determine your BMI and help you achieve or maintain a healthy weight.  For females 48 years of age and older: ? A BMI below 18.5 is considered underweight. ? A BMI of 18.5 to 24.9 is normal. ? A BMI of 25 to 29.9 is considered overweight. ? A BMI of 30 and above is considered obese.  Watch levels of cholesterol and blood lipids  You should start having your blood tested for lipids and cholesterol at 82 years of age, then have this test every 5 years.  You may need to have your cholesterol levels checked more often if: ? Your lipid or cholesterol levels are high. ? You are older than 82 years of age. ? You are at high risk for heart disease.  Cancer screening Lung Cancer  Lung cancer screening is recommended for adults 6-28 years old who are at high risk for lung cancer because of a history of smoking.  A yearly low-dose CT scan of the lungs is recommended for people who: ? Currently smoke. ? Have  quit within the past 15 years. ? Have at least a 30-pack-year history of smoking. A pack year is smoking an average of one pack of cigarettes a day for 1 year.  Yearly screening should continue until it has been 15 years since you quit.  Yearly screening should stop if you develop a health problem that would prevent you from having lung cancer treatment.  Breast Cancer  Practice breast self-awareness. This means understanding how your breasts normally appear and feel.  It also means doing regular breast self-exams. Let your health care provider  know about any changes, no matter how small.  If you are in your 20s or 30s, you should have a clinical breast exam (CBE) by a health care provider every 1-3 years as part of a regular health exam.  If you are 76 or older, have a CBE every year. Also consider having a breast X-ray (mammogram) every year.  If you have a family history of breast cancer, talk to your health care provider about genetic screening.  If you are at high risk for breast cancer, talk to your health care provider about having an MRI and a mammogram every year.  Breast cancer gene (BRCA) assessment is recommended for women who have family members with BRCA-related cancers. BRCA-related cancers include: ? Breast. ? Ovarian. ? Tubal. ? Peritoneal cancers.  Results of the assessment will determine the need for genetic counseling and BRCA1 and BRCA2 testing.  Cervical Cancer Your health care provider may recommend that you be screened regularly for cancer of the pelvic organs (ovaries, uterus, and vagina). This screening involves a pelvic examination, including checking for microscopic changes to the surface of your cervix (Pap test). You may be encouraged to have this screening done every 3 years, beginning at age 68.  For women ages 71-65, health care providers may recommend pelvic exams and Pap testing every 3 years, or they may recommend the Pap and pelvic exam, combined with testing for human papilloma virus (HPV), every 5 years. Some types of HPV increase your risk of cervical cancer. Testing for HPV may also be done on women of any age with unclear Pap test results.  Other health care providers may not recommend any screening for nonpregnant women who are considered low risk for pelvic cancer and who do not have symptoms. Ask your health care provider if a screening pelvic exam is right for you.  If you have had past treatment for cervical cancer or a condition that could lead to cancer, you need Pap tests and  screening for cancer for at least 20 years after your treatment. If Pap tests have been discontinued, your risk factors (such as having a new sexual partner) need to be reassessed to determine if screening should resume. Some women have medical problems that increase the chance of getting cervical cancer. In these cases, your health care provider may recommend more frequent screening and Pap tests.  Colorectal Cancer  This type of cancer can be detected and often prevented.  Routine colorectal cancer screening usually begins at 82 years of age and continues through 82 years of age.  Your health care provider may recommend screening at an earlier age if you have risk factors for colon cancer.  Your health care provider may also recommend using home test kits to check for hidden blood in the stool.  A small camera at the end of a tube can be used to examine your colon directly (sigmoidoscopy or colonoscopy). This is done to  check for the earliest forms of colorectal cancer.  Routine screening usually begins at age 61.  Direct examination of the colon should be repeated every 5-10 years through 82 years of age. However, you may need to be screened more often if early forms of precancerous polyps or small growths are found.  Skin Cancer  Check your skin from head to toe regularly.  Tell your health care provider about any new moles or changes in moles, especially if there is a change in a mole's shape or color.  Also tell your health care provider if you have a mole that is larger than the size of a pencil eraser.  Always use sunscreen. Apply sunscreen liberally and repeatedly throughout the day.  Protect yourself by wearing long sleeves, pants, a wide-brimmed hat, and sunglasses whenever you are outside.  Heart disease, diabetes, and high blood pressure  High blood pressure causes heart disease and increases the risk of stroke. High blood pressure is more likely to develop in: ? People  who have blood pressure in the high end of the normal range (130-139/85-89 mm Hg). ? People who are overweight or obese. ? People who are African American.  If you are 31-39 years of age, have your blood pressure checked every 3-5 years. If you are 24 years of age or older, have your blood pressure checked every year. You should have your blood pressure measured twice-once when you are at a hospital or clinic, and once when you are not at a hospital or clinic. Record the average of the two measurements. To check your blood pressure when you are not at a hospital or clinic, you can use: ? An automated blood pressure machine at a pharmacy. ? A home blood pressure monitor.  If you are between 17 years and 23 years old, ask your health care provider if you should take aspirin to prevent strokes.  Have regular diabetes screenings. This involves taking a blood sample to check your fasting blood sugar level. ? If you are at a normal weight and have a low risk for diabetes, have this test once every three years after 82 years of age. ? If you are overweight and have a high risk for diabetes, consider being tested at a younger age or more often. Preventing infection Hepatitis B  If you have a higher risk for hepatitis B, you should be screened for this virus. You are considered at high risk for hepatitis B if: ? You were born in a country where hepatitis B is common. Ask your health care provider which countries are considered high risk. ? Your parents were born in a high-risk country, and you have not been immunized against hepatitis B (hepatitis B vaccine). ? You have HIV or AIDS. ? You use needles to inject street drugs. ? You live with someone who has hepatitis B. ? You have had sex with someone who has hepatitis B. ? You get hemodialysis treatment. ? You take certain medicines for conditions, including cancer, organ transplantation, and autoimmune conditions.  Hepatitis C  Blood testing is  recommended for: ? Everyone born from [birth year] through 1963. ? Anyone with known risk factors for hepatitis C.  Sexually transmitted infections (STIs)  You should be screened for sexually transmitted infections (STIs) including gonorrhea and chlamydia if: ? You are sexually active and are younger than 82 years of age. ? You are older than 82 years of age and your health care provider tells you that you are at risk  for this type of infection. ? Your sexual activity has changed since you were last screened and you are at an increased risk for chlamydia or gonorrhea. Ask your health care provider if you are at risk.  If you do not have HIV, but are at risk, it may be recommended that you take a prescription medicine daily to prevent HIV infection. This is called pre-exposure prophylaxis (PrEP). You are considered at risk if: ? You are sexually active and do not regularly use condoms or know the HIV status of your partner(s). ? You take drugs by injection. ? You are sexually active with a partner who has HIV.  Talk with your health care provider about whether you are at high risk of being infected with HIV. If you choose to begin PrEP, you should first be tested for HIV. You should then be tested every 3 months for as long as you are taking PrEP. Pregnancy  If you are premenopausal and you may become pregnant, ask your health care provider about preconception counseling.  If you may become pregnant, take 400 to 800 micrograms (mcg) of folic acid every day.  If you want to prevent pregnancy, talk to your health care provider about birth control (contraception). Osteoporosis and menopause  Osteoporosis is a disease in which the bones lose minerals and strength with aging. This can result in serious bone fractures. Your risk for osteoporosis can be identified using a bone density scan.  If you are 31 years of age or older, or if you are at risk for osteoporosis and fractures, ask your health care  provider if you should be screened.  Ask your health care provider whether you should take a calcium or vitamin D supplement to lower your risk for osteoporosis.  Menopause may have certain physical symptoms and risks.  Hormone replacement therapy may reduce some of these symptoms and risks. Talk to your health care provider about whether hormone replacement therapy is right for you. Follow these instructions at home:  Schedule regular health, dental, and eye exams.  Stay current with your immunizations.  Do not use any tobacco products including cigarettes, chewing tobacco, or electronic cigarettes.  If you are pregnant, do not drink alcohol.  If you are breastfeeding, limit how much and how often you drink alcohol.  Limit alcohol intake to no more than 1 drink per day for nonpregnant women. One drink equals 12 ounces of beer, 5 ounces of Ardis [place], or 1 ounces of hard liquor.  Do not use street drugs.  Do not share needles.  Ask your health care provider for help if you need support or information about quitting drugs.  Tell your health care provider if you often feel depressed.  Tell your health care provider if you have ever been abused or do not feel safe at home. This information is not intended to replace advice given to you by your health care provider. Make sure you discuss any questions you have with your health care provider. Document Released: 02/21/2011 Document Revised: 01/14/2016 Document Reviewed: 05/12/2015 Elsevier Interactive Patient Education  [employer].

## 2018-02-14 NOTE — Assessment & Plan Note (Signed)
Seasonal allergies Controlled with Zyrtec over-the-counter

## 2018-02-14 NOTE — Assessment & Plan Note (Signed)
Not on medication Eats a healthy diet, walking regularly Check CMP, lipid panel and TSH

## 2018-02-14 NOTE — Assessment & Plan Note (Signed)
Blood pressure well controlled at home, but does have some degree of whitecoat hypertension blood pressure is typically elevated here Continue current medications at current doses Continue to monitor at home CMP, TSH

## 2018-02-14 NOTE — Patient Instructions (Addendum)
Test(s) ordered today. Your results will be released to [facility] (or called to you) after review, usually within 72hours after test completion. If any changes need to be made, you will be notified at that same time.  All other Health Maintenance issues reviewed.   All recommended immunizations and age-appropriate screenings are up-to-date or discussed.  No immunizations administered today.   Medications reviewed and updated.  No changes recommended at this time.  Your prescription(s) have been submitted to your pharmacy. Please take as directed and contact our office if you believe you are having problem(s) with the medication(s).   Please followup in one year   Health Maintenance, Female Adopting a healthy lifestyle and getting preventive care can go a long way to promote health and wellness. Talk with your health care provider about what schedule of regular examinations is right for you. This is a good chance for you to check in with your provider about disease prevention and staying healthy. In between checkups, there are plenty of things you can do on your own. Experts have done a lot of research about which lifestyle changes and preventive measures are most likely to keep you healthy. Ask your health care provider for more information. Weight and diet Eat a healthy diet  Be sure to include plenty of vegetables, fruits, low-fat dairy products, and lean protein.  Do not eat a lot of foods high in solid fats, added sugars, or salt.  Get regular exercise. This is one of the most important things you can do for your health. ? Most adults should exercise for at least 150 minutes each week. The exercise should increase your heart rate and make you sweat (moderate-intensity exercise). ? Most adults should also do strengthening exercises at least twice a week. This is in addition to the moderate-intensity exercise.  Maintain a healthy weight  Body mass index (BMI) is a measurement that can  be used to identify possible weight problems. It estimates body fat based on height and weight. Your health care provider can help determine your BMI and help you achieve or maintain a healthy weight.  For females 37 years of age and older: ? A BMI below 18.5 is considered underweight. ? A BMI of 18.5 to 24.9 is normal. ? A BMI of 25 to 29.9 is considered overweight. ? A BMI of 30 and above is considered obese.  Watch levels of cholesterol and blood lipids  You should start having your blood tested for lipids and cholesterol at 82 years of age, then have this test every 5 years.  You may need to have your cholesterol levels checked more often if: ? Your lipid or cholesterol levels are high. ? You are older than 82 years of age. ? You are at high risk for heart disease.  Cancer screening Lung Cancer  Lung cancer screening is recommended for adults 67-73 years old who are at high risk for lung cancer because of a history of smoking.  A yearly low-dose CT scan of the lungs is recommended for people who: ? Currently smoke. ? Have quit within the past 15 years. ? Have at least a 30-pack-year history of smoking. A pack year is smoking an average of one pack of cigarettes a day for 1 year.  Yearly screening should continue until it has been 15 years since you quit.  Yearly screening should stop if you develop a health problem that would prevent you from having lung cancer treatment.  Breast Cancer  Practice breast self-awareness.  This means understanding how your breasts normally appear and feel.  It also means doing regular breast self-exams. Let your health care provider know about any changes, no matter how small.  If you are in your 20s or 30s, you should have a clinical breast exam (CBE) by a health care provider every 1-3 years as part of a regular health exam.  If you are 29 or older, have a CBE every year. Also consider having a breast X-ray (mammogram) every year.  If you  have a family history of breast cancer, talk to your health care provider about genetic screening.  If you are at high risk for breast cancer, talk to your health care provider about having an MRI and a mammogram every year.  Breast cancer gene (BRCA) assessment is recommended for women who have family members with BRCA-related cancers. BRCA-related cancers include: ? Breast. ? Ovarian. ? Tubal. ? Peritoneal cancers.  Results of the assessment will determine the need for genetic counseling and BRCA1 and BRCA2 testing.  Cervical Cancer Your health care provider may recommend that you be screened regularly for cancer of the pelvic organs (ovaries, uterus, and vagina). This screening involves a pelvic examination, including checking for microscopic changes to the surface of your cervix (Pap test). You may be encouraged to have this screening done every 3 years, beginning at age 40.  For women ages 44-65, health care providers may recommend pelvic exams and Pap testing every 3 years, or they may recommend the Pap and pelvic exam, combined with testing for human papilloma virus (HPV), every 5 years. Some types of HPV increase your risk of cervical cancer. Testing for HPV may also be done on women of any age with unclear Pap test results.  Other health care providers may not recommend any screening for nonpregnant women who are considered low risk for pelvic cancer and who do not have symptoms. Ask your health care provider if a screening pelvic exam is right for you.  If you have had past treatment for cervical cancer or a condition that could lead to cancer, you need Pap tests and screening for cancer for at least 20 years after your treatment. If Pap tests have been discontinued, your risk factors (such as having a new sexual partner) need to be reassessed to determine if screening should resume. Some women have medical problems that increase the chance of getting cervical cancer. In these cases,  your health care provider may recommend more frequent screening and Pap tests.  Colorectal Cancer  This type of cancer can be detected and often prevented.  Routine colorectal cancer screening usually begins at 82 years of age and continues through 82 years of age.  Your health care provider may recommend screening at an earlier age if you have risk factors for colon cancer.  Your health care provider may also recommend using home test kits to check for hidden blood in the stool.  A small camera at the end of a tube can be used to examine your colon directly (sigmoidoscopy or colonoscopy). This is done to check for the earliest forms of colorectal cancer.  Routine screening usually begins at age 96.  Direct examination of the colon should be repeated every 5-10 years through 82 years of age. However, you may need to be screened more often if early forms of precancerous polyps or small growths are found.  Skin Cancer  Check your skin from head to toe regularly.  Tell your health care provider about any  new moles or changes in moles, especially if there is a change in a mole's shape or color.  Also tell your health care provider if you have a mole that is larger than the size of a pencil eraser.  Always use sunscreen. Apply sunscreen liberally and repeatedly throughout the day.  Protect yourself by wearing long sleeves, pants, a wide-brimmed hat, and sunglasses whenever you are outside.  Heart disease, diabetes, and high blood pressure  High blood pressure causes heart disease and increases the risk of stroke. High blood pressure is more likely to develop in: ? People who have blood pressure in the high end of the normal range (130-139/85-89 mm Hg). ? People who are overweight or obese. ? People who are African American.  If you are 58-1 years of age, have your blood pressure checked every 3-5 years. If you are 52 years of age or older, have your blood pressure checked every year.  You should have your blood pressure measured twice-once when you are at a hospital or clinic, and once when you are not at a hospital or clinic. Record the average of the two measurements. To check your blood pressure when you are not at a hospital or clinic, you can use: ? An automated blood pressure machine at a pharmacy. ? A home blood pressure monitor.  If you are between 24 years and 65 years old, ask your health care provider if you should take aspirin to prevent strokes.  Have regular diabetes screenings. This involves taking a blood sample to check your fasting blood sugar level. ? If you are at a normal weight and have a low risk for diabetes, have this test once every three years after 82 years of age. ? If you are overweight and have a high risk for diabetes, consider being tested at a younger age or more often. Preventing infection Hepatitis B  If you have a higher risk for hepatitis B, you should be screened for this virus. You are considered at high risk for hepatitis B if: ? You were born in a country where hepatitis B is common. Ask your health care provider which countries are considered high risk. ? Your parents were born in a high-risk country, and you have not been immunized against hepatitis B (hepatitis B vaccine). ? You have HIV or AIDS. ? You use needles to inject street drugs. ? You live with someone who has hepatitis B. ? You have had sex with someone who has hepatitis B. ? You get hemodialysis treatment. ? You take certain medicines for conditions, including cancer, organ transplantation, and autoimmune conditions.  Hepatitis C  Blood testing is recommended for: ? Everyone born from [birth year] through 1963. ? Anyone with known risk factors for hepatitis C.  Sexually transmitted infections (STIs)  You should be screened for sexually transmitted infections (STIs) including gonorrhea and chlamydia if: ? You are sexually active and are younger than 82 years of  age. ? You are older than 82 years of age and your health care provider tells you that you are at risk for this type of infection. ? Your sexual activity has changed since you were last screened and you are at an increased risk for chlamydia or gonorrhea. Ask your health care provider if you are at risk.  If you do not have HIV, but are at risk, it may be recommended that you take a prescription medicine daily to prevent HIV infection. This is called pre-exposure prophylaxis (PrEP). You are considered at  risk if: ? You are sexually active and do not regularly use condoms or know the HIV status of your partner(s). ? You take drugs by injection. ? You are sexually active with a partner who has HIV.  Talk with your health care provider about whether you are at high risk of being infected with HIV. If you choose to begin PrEP, you should first be tested for HIV. You should then be tested every 3 months for as long as you are taking PrEP. Pregnancy  If you are premenopausal and you may become pregnant, ask your health care provider about preconception counseling.  If you may become pregnant, take 400 to 800 micrograms (mcg) of folic acid every day.  If you want to prevent pregnancy, talk to your health care provider about birth control (contraception). Osteoporosis and menopause  Osteoporosis is a disease in which the bones lose minerals and strength with aging. This can result in serious bone fractures. Your risk for osteoporosis can be identified using a bone density scan.  If you are 6 years of age or older, or if you are at risk for osteoporosis and fractures, ask your health care provider if you should be screened.  Ask your health care provider whether you should take a calcium or vitamin D supplement to lower your risk for osteoporosis.  Menopause may have certain physical symptoms and risks.  Hormone replacement therapy may reduce some of these symptoms and risks. Talk to your health  care provider about whether hormone replacement therapy is right for you. Follow these instructions at home:  Schedule regular health, dental, and eye exams.  Stay current with your immunizations.  Do not use any tobacco products including cigarettes, chewing tobacco, or electronic cigarettes.  If you are pregnant, do not drink alcohol.  If you are breastfeeding, limit how much and how often you drink alcohol.  Limit alcohol intake to no more than 1 drink per day for nonpregnant women. One drink equals 12 ounces of beer, 5 ounces of wine, or 1 ounces of hard liquor.  Do not use street drugs.  Do not share needles.  Ask your health care provider for help if you need support or information about quitting drugs.  Tell your health care provider if you often feel depressed.  Tell your health care provider if you have ever been abused or do not feel safe at home. This information is not intended to replace advice given to you by your health care provider. Make sure you discuss any questions you have with your health care provider. Document Released: 02/21/2011 Document Revised: 01/14/2016 Document Reviewed: 05/12/2015 Elsevier Interactive Patient Education  [employer].

## 2018-02-14 NOTE — Progress Notes (Signed)
Subjective:    Patient ID: Renee Figueroa, female    DOB: August 22, 1934, 84 y.o.   MRN: [id number]  HPI She is here for a physical exam.   BP at home controlled.  It is always higher at the doctor's office.  She is taking her medication daily.  She denies any changes in her health.  She has no concerns.  Medications and allergies reviewed with patient and updated if appropriate.  Patient Active Problem List   Diagnosis Date Noted  . Uses hearing aid 02/14/2018  . Heart murmur 02/12/2016  . COLONIC POLYPS, ADENOMATOUS 01/19/2010  . RHINITIS 01/19/2010  . DEGENERATIVE JOINT DISEASE 01/19/2010  . SKIN CANCER, HX OF 01/19/2010  . Hyperlipidemia 07/27/2007  . Essential hypertension 07/27/2007  . Osteoporosis 07/27/2007    Current Outpatient Medications on File Prior to Visit  Medication Sig Dispense Refill  . Cetirizine HCl (ZYRTEC ALLERGY) 10 MG CAPS Zyrtec 10 mg capsule  Take 1 capsule every day by oral route.     No current facility-administered medications on file prior to visit.     Past Medical History:  Diagnosis Date  . Allergy    seasonal  . Arthritis    mild  . Basal cell carcinoma of anterior chest    skin  . Colon polyps    adenomatous  . Diverticulosis   . Heart murmur   . Herpes zoster 04/2011   C 6  LUE  . Hyperlipidemia   . Hypertension   . Osteoporosis   . Renal calculi 1962    X 1     Past Surgical History:  Procedure Laterality Date  . colonoscopy with polypectomy  2004    adenomatous; Dr Henrene Pastor. Neg 2013  . DILATION AND CURETTAGE OF UTERUS     age 29  . G 2 P 2    . TONSILLECTOMY AND ADENOIDECTOMY      Social History   Socioeconomic History  . Marital status: Married    Spouse name: Not on file  . Number of children: 2  . Years of education: Not on file  . Highest education level: Not on file  Occupational History  . Not on file  Social Needs  . Financial resource strain: Not hard at all  . Food insecurity:    Worry: Never  true    Inability: Never true  . Transportation needs:    Medical: No    Non-medical: No  Tobacco Use  . Smoking status: Never Smoker  . Smokeless tobacco: Never Used  Substance and Sexual Activity  . Alcohol use: Yes    Comment: maybe 1 drink a month  . Drug use: No  . Sexual activity: Not Currently  Lifestyle  . Physical activity:    Days per week: 4 days    Minutes per session: 30 min  . Stress: Only a little  Relationships  . Social connections:    Talks on phone: More than three times a week    Gets together: More than three times a week    Attends religious service: More than 4 times per year    Active member of club or organization: Not on file    Attends meetings of clubs or organizations: More than 4 times per year    Relationship status: Married  Other Topics Concern  . Not on file  Social History Narrative  . Not on file    Family History  Problem Relation Age of Onset  . Hypertension  Mother   . Stomach cancer Mother        died @ 43  . Heart attack Father 66  . Colon cancer Neg Hx   . Colon polyps Neg Hx   . Rectal cancer Neg Hx   . Diabetes Neg Hx   . Stroke Neg Hx     Review of Systems  Constitutional: Negative for chills, fatigue and fever.  Eyes: Negative for visual disturbance.  Respiratory: Negative for cough, shortness of breath and wheezing.   Cardiovascular: Negative for chest pain, palpitations and leg swelling.  Gastrointestinal: Negative for abdominal pain, blood in stool, constipation, diarrhea and nausea.       No gerd  Genitourinary: Negative for dysuria and hematuria.  Musculoskeletal: Positive for arthralgias and back pain (sees a chiropractor).  Skin: Negative for color change and rash.  Neurological: Negative for light-headedness and headaches.  Psychiatric/Behavioral: Negative for hallucinations and sleep disturbance. The patient is not nervous/anxious.        Objective:   Vitals:   02/14/18 1413  BP: (!) 146/66  Pulse:  88  Resp: 16  Temp: 97.9 F (36.6 C)  SpO2: 95%   Filed Weights   02/14/18 1413  Weight: 113 lb (51.3 kg)   Body mass index is 22.82 kg/m.  Wt Readings from Last 3 Encounters:  02/14/18 113 lb (51.3 kg)  02/14/18 113 lb (51.3 kg)  06/07/17 114 lb (51.7 kg)     Physical Exam Constitutional: She appears well-developed and well-nourished. No distress.  HENT:  Head: Normocephalic and atraumatic.  Right Ear: External ear normal. Normal ear canal and TM Left Ear: External ear normal.  Normal ear canal and TM Mouth/Throat: Oropharynx is clear and moist.  Eyes: Conjunctivae and EOM are normal.  Neck: Neck supple. No tracheal deviation present. No thyromegaly present.  No carotid bruit  Cardiovascular: Normal rate, regular rhythm and normal heart sounds.   3/6 systolic murmur heard.  No edema. Pulmonary/Chest: Effort normal and breath sounds normal. No respiratory distress. She has no wheezes. She has no rales.  Breast: deferred to Gyn Abdominal: Soft. She exhibits no distension. There is no tenderness.  Lymphadenopathy: She has no cervical adenopathy.  Skin: Skin is warm and dry. She is not diaphoretic.  Psychiatric: She has a normal mood and affect. Her behavior is normal.        Assessment & Plan:   Physical exam: Screening blood work ordered Immunizations   discussed shingles vaccine Colonoscopy   no longer needed due to age 42    up-to-date Gyn   up-to-date Dexa   due-history of osteoporosis-ordered by wellness nurse and she will schedule eye exams   up-to-date EKG   last done 10/2012 Exercise   walking regularly for exercise Weight   normal BMI Skin  No concerns Substance abuse     none  See Problem List for Assessment and Plan of chronic medical problems.   Follow-up annually

## 2018-02-14 NOTE — Assessment & Plan Note (Signed)
DEXA due-ordered She is walking regularly for exercise Has had Reclast x1 Did not tolerate Fosamax secondary to GERD Can consider Reclast again or Prolia if medication is needed

## 2018-02-15 ENCOUNTER — Encounter: Payer: Self-pay | Admitting: Internal Medicine

## 2018-02-21 ENCOUNTER — Other Ambulatory Visit: Payer: Medicare Other

## 2018-02-28 ENCOUNTER — Other Ambulatory Visit: Payer: Medicare Other

## 2018-02-28 ENCOUNTER — Ambulatory Visit (INDEPENDENT_AMBULATORY_CARE_PROVIDER_SITE_OTHER)
Admission: RE | Admit: 2018-02-28 | Discharge: 2018-02-28 | Disposition: A | Discharge status: Home / Self Care | Payer: Medicare Other | Source: Ambulatory Visit | Attending: Internal Medicine | Admitting: Internal Medicine

## 2018-02-28 DIAGNOSIS — E2839 Other primary ovarian failure: Secondary | ICD-10-CM | POA: Diagnosis not present

## 2018-03-04 ENCOUNTER — Encounter: Payer: Self-pay | Admitting: Internal Medicine

## 2018-03-07 ENCOUNTER — Encounter: Payer: Self-pay | Admitting: Internal Medicine

## 2018-03-23 DIAGNOSIS — D0471 Carcinoma in situ of skin of right lower limb, including hip: Secondary | ICD-10-CM | POA: Diagnosis not present

## 2018-03-23 DIAGNOSIS — L570 Actinic keratosis: Secondary | ICD-10-CM | POA: Diagnosis not present

## 2018-03-23 DIAGNOSIS — Z85828 Personal history of other malignant neoplasm of skin: Secondary | ICD-10-CM | POA: Diagnosis not present

## 2018-03-23 DIAGNOSIS — L821 Other seborrheic keratosis: Secondary | ICD-10-CM | POA: Diagnosis not present

## 2018-05-24 ENCOUNTER — Ambulatory Visit (INDEPENDENT_AMBULATORY_CARE_PROVIDER_SITE_OTHER): Payer: Medicare Other

## 2018-05-24 ENCOUNTER — Encounter: Payer: Self-pay | Admitting: Internal Medicine

## 2018-05-24 DIAGNOSIS — Z23 Encounter for immunization: Secondary | ICD-10-CM | POA: Diagnosis not present

## 2018-05-24 NOTE — Progress Notes (Signed)
Patient came into the office to receive her flu vaccine. Patient received the vaccine in her right deltoid & tolerated the injection well. No signs/symptoms of a reaction prior to leaving the exam room. VIS given to patient.

## 2018-07-05 DIAGNOSIS — Z961 Presence of intraocular lens: Secondary | ICD-10-CM | POA: Diagnosis not present

## 2018-07-05 DIAGNOSIS — H40053 Ocular hypertension, bilateral: Secondary | ICD-10-CM | POA: Diagnosis not present

## 2018-07-16 DIAGNOSIS — D0471 Carcinoma in situ of skin of right lower limb, including hip: Secondary | ICD-10-CM | POA: Diagnosis not present

## 2018-10-03 DIAGNOSIS — M9902 Segmental and somatic dysfunction of thoracic region: Secondary | ICD-10-CM | POA: Diagnosis not present

## 2018-10-03 DIAGNOSIS — M546 Pain in thoracic spine: Secondary | ICD-10-CM | POA: Diagnosis not present

## 2018-10-03 DIAGNOSIS — M9903 Segmental and somatic dysfunction of lumbar region: Secondary | ICD-10-CM | POA: Diagnosis not present

## 2018-10-03 DIAGNOSIS — M5442 Lumbago with sciatica, left side: Secondary | ICD-10-CM | POA: Diagnosis not present

## 2018-10-09 DIAGNOSIS — M5441 Lumbago with sciatica, right side: Secondary | ICD-10-CM | POA: Diagnosis not present

## 2018-10-09 DIAGNOSIS — M9902 Segmental and somatic dysfunction of thoracic region: Secondary | ICD-10-CM | POA: Diagnosis not present

## 2018-10-09 DIAGNOSIS — M9904 Segmental and somatic dysfunction of sacral region: Secondary | ICD-10-CM | POA: Diagnosis not present

## 2018-10-09 DIAGNOSIS — M9903 Segmental and somatic dysfunction of lumbar region: Secondary | ICD-10-CM | POA: Diagnosis not present

## 2018-10-16 DIAGNOSIS — M9903 Segmental and somatic dysfunction of lumbar region: Secondary | ICD-10-CM | POA: Diagnosis not present

## 2018-10-16 DIAGNOSIS — M5441 Lumbago with sciatica, right side: Secondary | ICD-10-CM | POA: Diagnosis not present

## 2018-10-16 DIAGNOSIS — M9904 Segmental and somatic dysfunction of sacral region: Secondary | ICD-10-CM | POA: Diagnosis not present

## 2018-10-16 DIAGNOSIS — M9902 Segmental and somatic dysfunction of thoracic region: Secondary | ICD-10-CM | POA: Diagnosis not present

## 2018-10-23 DIAGNOSIS — M9903 Segmental and somatic dysfunction of lumbar region: Secondary | ICD-10-CM | POA: Diagnosis not present

## 2018-10-23 DIAGNOSIS — M5441 Lumbago with sciatica, right side: Secondary | ICD-10-CM | POA: Diagnosis not present

## 2018-10-23 DIAGNOSIS — M9904 Segmental and somatic dysfunction of sacral region: Secondary | ICD-10-CM | POA: Diagnosis not present

## 2018-10-23 DIAGNOSIS — M9902 Segmental and somatic dysfunction of thoracic region: Secondary | ICD-10-CM | POA: Diagnosis not present

## 2018-10-30 DIAGNOSIS — M9904 Segmental and somatic dysfunction of sacral region: Secondary | ICD-10-CM | POA: Diagnosis not present

## 2018-10-30 DIAGNOSIS — M9903 Segmental and somatic dysfunction of lumbar region: Secondary | ICD-10-CM | POA: Diagnosis not present

## 2018-10-30 DIAGNOSIS — M5441 Lumbago with sciatica, right side: Secondary | ICD-10-CM | POA: Diagnosis not present

## 2018-10-30 DIAGNOSIS — M9902 Segmental and somatic dysfunction of thoracic region: Secondary | ICD-10-CM | POA: Diagnosis not present

## 2019-01-08 DIAGNOSIS — M9903 Segmental and somatic dysfunction of lumbar region: Secondary | ICD-10-CM | POA: Diagnosis not present

## 2019-01-08 DIAGNOSIS — M5441 Lumbago with sciatica, right side: Secondary | ICD-10-CM | POA: Diagnosis not present

## 2019-01-08 DIAGNOSIS — M9904 Segmental and somatic dysfunction of sacral region: Secondary | ICD-10-CM | POA: Diagnosis not present

## 2019-01-08 DIAGNOSIS — M546 Pain in thoracic spine: Secondary | ICD-10-CM | POA: Diagnosis not present

## 2019-01-10 DIAGNOSIS — M5441 Lumbago with sciatica, right side: Secondary | ICD-10-CM | POA: Diagnosis not present

## 2019-01-10 DIAGNOSIS — M9904 Segmental and somatic dysfunction of sacral region: Secondary | ICD-10-CM | POA: Diagnosis not present

## 2019-01-10 DIAGNOSIS — M9903 Segmental and somatic dysfunction of lumbar region: Secondary | ICD-10-CM | POA: Diagnosis not present

## 2019-01-10 DIAGNOSIS — M546 Pain in thoracic spine: Secondary | ICD-10-CM | POA: Diagnosis not present

## 2019-01-21 DIAGNOSIS — M546 Pain in thoracic spine: Secondary | ICD-10-CM | POA: Diagnosis not present

## 2019-01-21 DIAGNOSIS — M5441 Lumbago with sciatica, right side: Secondary | ICD-10-CM | POA: Diagnosis not present

## 2019-01-21 DIAGNOSIS — M9903 Segmental and somatic dysfunction of lumbar region: Secondary | ICD-10-CM | POA: Diagnosis not present

## 2019-01-21 DIAGNOSIS — M9904 Segmental and somatic dysfunction of sacral region: Secondary | ICD-10-CM | POA: Diagnosis not present

## 2019-01-31 DIAGNOSIS — M546 Pain in thoracic spine: Secondary | ICD-10-CM | POA: Diagnosis not present

## 2019-01-31 DIAGNOSIS — M5441 Lumbago with sciatica, right side: Secondary | ICD-10-CM | POA: Diagnosis not present

## 2019-01-31 DIAGNOSIS — M9904 Segmental and somatic dysfunction of sacral region: Secondary | ICD-10-CM | POA: Diagnosis not present

## 2019-01-31 DIAGNOSIS — M9903 Segmental and somatic dysfunction of lumbar region: Secondary | ICD-10-CM | POA: Diagnosis not present

## 2019-02-14 DIAGNOSIS — M9903 Segmental and somatic dysfunction of lumbar region: Secondary | ICD-10-CM | POA: Diagnosis not present

## 2019-02-14 DIAGNOSIS — M5441 Lumbago with sciatica, right side: Secondary | ICD-10-CM | POA: Diagnosis not present

## 2019-02-14 DIAGNOSIS — M546 Pain in thoracic spine: Secondary | ICD-10-CM | POA: Diagnosis not present

## 2019-02-14 DIAGNOSIS — M9904 Segmental and somatic dysfunction of sacral region: Secondary | ICD-10-CM | POA: Diagnosis not present

## 2019-02-18 ENCOUNTER — Encounter: Payer: Medicare Other | Admitting: Internal Medicine

## 2019-02-18 ENCOUNTER — Ambulatory Visit: Payer: Medicare Other

## 2019-02-28 DIAGNOSIS — M546 Pain in thoracic spine: Secondary | ICD-10-CM | POA: Diagnosis not present

## 2019-02-28 DIAGNOSIS — M5441 Lumbago with sciatica, right side: Secondary | ICD-10-CM | POA: Diagnosis not present

## 2019-02-28 DIAGNOSIS — M9903 Segmental and somatic dysfunction of lumbar region: Secondary | ICD-10-CM | POA: Diagnosis not present

## 2019-02-28 DIAGNOSIS — M9904 Segmental and somatic dysfunction of sacral region: Secondary | ICD-10-CM | POA: Diagnosis not present

## 2019-03-14 DIAGNOSIS — M546 Pain in thoracic spine: Secondary | ICD-10-CM | POA: Diagnosis not present

## 2019-03-14 DIAGNOSIS — M5441 Lumbago with sciatica, right side: Secondary | ICD-10-CM | POA: Diagnosis not present

## 2019-03-14 DIAGNOSIS — M9903 Segmental and somatic dysfunction of lumbar region: Secondary | ICD-10-CM | POA: Diagnosis not present

## 2019-03-14 DIAGNOSIS — M9904 Segmental and somatic dysfunction of sacral region: Secondary | ICD-10-CM | POA: Diagnosis not present

## 2019-04-01 DIAGNOSIS — M5441 Lumbago with sciatica, right side: Secondary | ICD-10-CM | POA: Diagnosis not present

## 2019-04-01 DIAGNOSIS — M9903 Segmental and somatic dysfunction of lumbar region: Secondary | ICD-10-CM | POA: Diagnosis not present

## 2019-04-01 DIAGNOSIS — M546 Pain in thoracic spine: Secondary | ICD-10-CM | POA: Diagnosis not present

## 2019-04-01 DIAGNOSIS — M9904 Segmental and somatic dysfunction of sacral region: Secondary | ICD-10-CM | POA: Diagnosis not present

## 2019-04-17 ENCOUNTER — Other Ambulatory Visit: Payer: Self-pay | Admitting: Internal Medicine

## 2019-05-01 DIAGNOSIS — R739 Hyperglycemia, unspecified: Secondary | ICD-10-CM | POA: Insufficient documentation

## 2019-05-01 NOTE — Patient Instructions (Addendum)
Tests ordered today. Your results will be released to [place] (or called to you) after review.  If any changes need to be made, you will be notified at that same time.  All other Health Maintenance issues reviewed.   All recommended immunizations and age-appropriate screenings are up-to-date or discussed.  Flu immunization administered today.    Medications reviewed and updated.  Changes include :   none  Your prescription(s) have been submitted to your pharmacy. Please take as directed and contact our office if you believe you are having problem(s) with the medication(s).  A Ultrasound of your heart was ordered - they will call you to schedule this.   Please followup in 1 year    Health Maintenance, Female Adopting a healthy lifestyle and getting preventive care are important in promoting health and wellness. Ask your health care provider about:  The right schedule for you to have regular tests and exams.  Things you can do on your own to prevent diseases and keep yourself healthy. What should I know about diet, weight, and exercise? Eat a healthy diet   Eat a diet that includes plenty of vegetables, fruits, low-fat dairy products, and lean protein.  Do not eat a lot of foods that are high in solid fats, added sugars, or sodium. Maintain a healthy weight Body mass index (BMI) is used to identify weight problems. It estimates body fat based on height and weight. Your health care provider can help determine your BMI and help you achieve or maintain a healthy weight. Get regular exercise Get regular exercise. This is one of the most important things you can do for your health. Most adults should:  Exercise for at least 150 minutes each week. The exercise should increase your heart rate and make you sweat (moderate-intensity exercise).  Do strengthening exercises at least twice a week. This is in addition to the moderate-intensity exercise.  Spend less time sitting. Even light  physical activity can be beneficial. Watch cholesterol and blood lipids Have your blood tested for lipids and cholesterol at 83 years of age, then have this test every 5 years. Have your cholesterol levels checked more often if:  Your lipid or cholesterol levels are high.  You are older than 83 years of age.  You are at high risk for heart disease. What should I know about cancer screening? Depending on your health history and family history, you may need to have cancer screening at various ages. This may include screening for:  Breast cancer.  Cervical cancer.  Colorectal cancer.  Skin cancer.  Lung cancer. What should I know about heart disease, diabetes, and high blood pressure? Blood pressure and heart disease  High blood pressure causes heart disease and increases the risk of stroke. This is more likely to develop in people who have high blood pressure readings, are of African descent, or are overweight.  Have your blood pressure checked: ? Every 3-5 years if you are 71-35 years of age. ? Every year if you are 67 years old or older. Diabetes Have regular diabetes screenings. This checks your fasting blood sugar level. Have the screening done:  Once every three years after age 34 if you are at a normal weight and have a low risk for diabetes.  More often and at a younger age if you are overweight or have a high risk for diabetes. What should I know about preventing infection? Hepatitis B If you have a higher risk for hepatitis B, you should be screened  for this virus. Talk with your health care provider to find out if you are at risk for hepatitis B infection. Hepatitis C Testing is recommended for:  Everyone born from [birth year] through 1965.  Anyone with known risk factors for hepatitis C. Sexually transmitted infections (STIs)  Get screened for STIs, including gonorrhea and chlamydia, if: ? You are sexually active and are younger than 83 years of age. ? You are older  than 83 years of age and your health care provider tells you that you are at risk for this type of infection. ? Your sexual activity has changed since you were last screened, and you are at increased risk for chlamydia or gonorrhea. Ask your health care provider if you are at risk.  Ask your health care provider about whether you are at high risk for HIV. Your health care provider may recommend a prescription medicine to help prevent HIV infection. If you choose to take medicine to prevent HIV, you should first get tested for HIV. You should then be tested every 3 months for as long as you are taking the medicine. Pregnancy  If you are about to stop having your period (premenopausal) and you may become pregnant, seek counseling before you get pregnant.  Take 400 to 800 micrograms (mcg) of folic acid every day if you become pregnant.  Ask for birth control (contraception) if you want to prevent pregnancy. Osteoporosis and menopause Osteoporosis is a disease in which the bones lose minerals and strength with aging. This can result in bone fractures. If you are 38 years old or older, or if you are at risk for osteoporosis and fractures, ask your health care provider if you should:  Be screened for bone loss.  Take a calcium or vitamin D supplement to lower your risk of fractures.  Be given hormone replacement therapy (HRT) to treat symptoms of menopause. Follow these instructions at home: Lifestyle  Do not use any products that contain nicotine or tobacco, such as cigarettes, e-cigarettes, and chewing tobacco. If you need help quitting, ask your health care provider.  Do not use street drugs.  Do not share needles.  Ask your health care provider for help if you need support or information about quitting drugs. Alcohol use  Do not drink alcohol if: ? Your health care provider tells you not to drink. ? You are pregnant, may be pregnant, or are planning to become pregnant.  If you drink  alcohol: ? Limit how much you use to 0-1 drink a day. ? Limit intake if you are breastfeeding.  Be aware of how much alcohol is in your drink. In the [place] one drink equals one 12 oz bottle of beer (355 mL), one 5 oz glass of wine (148 mL), or one 1 oz glass of hard liquor (44 mL). General instructions  Schedule regular health, dental, and eye exams.  Stay current with your vaccines.  Tell your health care provider if: ? You often feel depressed. ? You have ever been abused or do not feel safe at home. Summary  Adopting a healthy lifestyle and getting preventive care are important in promoting health and wellness.  Follow your health care provider's instructions about healthy diet, exercising, and getting tested or screened for diseases.  Follow your health care provider's instructions on monitoring your cholesterol and blood pressure. This information is not intended to replace advice given to you by your health care provider. Make sure you discuss any questions you have with your health care  provider. Document Released: 02/21/2011 Document Revised: 08/01/2018 Document Reviewed: 08/01/2018 Elsevier Patient Education  2020 [employer].

## 2019-05-01 NOTE — Progress Notes (Signed)
Subjective:    Patient ID: Renee Figueroa, female    DOB: 05-Dec-1933, 85 y.o.   MRN: [id number]  HPI She is here for a physical exam.   She denies any major changes in her health since she was here last year.  He has no concerns.  Her husband had a stroke earlier this year so she has been busy caring for him.  Overall he is doing well and the stroke was mild.  She does monitor her blood pressure at home.  Typically is well controlled.  She does it occasionally.  Medications and allergies reviewed with patient and updated if appropriate.  Patient Active Problem List   Diagnosis Date Noted  . Hyperglycemia 05/01/2019  . Uses hearing aid 02/14/2018  . Heart murmur 02/12/2016  . COLONIC POLYPS, ADENOMATOUS 01/19/2010  . Allergic rhinitis 01/19/2010  . DEGENERATIVE JOINT DISEASE 01/19/2010  . SKIN CANCER, HX OF 01/19/2010  . Hyperlipidemia 07/27/2007  . Essential hypertension 07/27/2007  . Osteoporosis 07/27/2007    Current Outpatient Medications on File Prior to Visit  Medication Sig Dispense Refill  . amLODipine (NORVASC) 10 MG tablet TAKE 1 TABLET BY MOUTH DAILY. NEEDS OFFICE VISIT FOR MORE REFILLS. 90 tablet 0  . fluticasone (FLONASE) 50 MCG/ACT nasal spray Place into both nostrils daily.    . hydrochlorothiazide (HYDRODIURIL) 25 MG tablet TAKE HALF A TABLET BY MOUTH DAILY. OFFICE VISIT IS NEEDED 45 tablet 0   No current facility-administered medications on file prior to visit.     Past Medical History:  Diagnosis Date  . Allergy    seasonal  . Arthritis    mild  . Basal cell carcinoma of anterior chest    skin  . Colon polyps    adenomatous  . Diverticulosis   . Heart murmur   . Herpes zoster 04/2011   C 6  LUE  . Hyperlipidemia   . Hypertension   . Osteoporosis   . Renal calculi 1962    X 1     Past Surgical History:  Procedure Laterality Date  . colonoscopy with polypectomy  2004    adenomatous; Dr Henrene Pastor. Neg 2013  . DILATION AND CURETTAGE OF UTERUS      age 31  . G 2 P 2    . TONSILLECTOMY AND ADENOIDECTOMY      Social History   Socioeconomic History  . Marital status: Married    Spouse name: Not on file  . Number of children: 2  . Years of education: Not on file  . Highest education level: Not on file  Occupational History  . Not on file  Social Needs  . Financial resource strain: Not hard at all  . Food insecurity    Worry: Never true    Inability: Never true  . Transportation needs    Medical: No    Non-medical: No  Tobacco Use  . Smoking status: Never Smoker  . Smokeless tobacco: Never Used  Substance and Sexual Activity  . Alcohol use: Yes    Comment: maybe 1 drink a month  . Drug use: No  . Sexual activity: Not Currently  Lifestyle  . Physical activity    Days per week: 4 days    Minutes per session: 30 min  . Stress: Only a little  Relationships  . Social connections    Talks on phone: More than three times a week    Gets together: More than three times a week    Attends religious  service: More than 4 times per year    Active member of club or organization: Not on file    Attends meetings of clubs or organizations: More than 4 times per year    Relationship status: Married  Other Topics Concern  . Not on file  Social History Narrative  . Not on file    Family History  Problem Relation Age of Onset  . Hypertension Mother   . Stomach cancer Mother        died @ 16  . Heart attack Father 21  . Colon cancer Neg Hx   . Colon polyps Neg Hx   . Rectal cancer Neg Hx   . Diabetes Neg Hx   . Stroke Neg Hx     Review of Systems  Constitutional: Negative for chills and fever.  Eyes: Negative for visual disturbance.  Respiratory: Negative for cough, shortness of breath and wheezing.   Cardiovascular: Negative for chest pain, palpitations and leg swelling.  Gastrointestinal: Negative for abdominal pain, blood in stool, constipation, diarrhea and nausea.       Rare gerd  Genitourinary: Negative for  dysuria and hematuria.  Musculoskeletal: Positive for arthralgias and back pain.  Skin: Negative for color change and rash.  Neurological: Negative for dizziness, light-headedness, numbness and headaches.  Psychiatric/Behavioral: Negative for dysphoric mood. The patient is not nervous/anxious.        Objective:   Vitals:   05/02/19 0858  BP: (!) 158/80  Pulse: 90  Resp: 16  Temp: 98 F (36.7 C)  SpO2: 95%   Filed Weights   05/02/19 0858  Weight: 108 lb (49 kg)   Body mass index is 21.81 kg/m.  BP Readings from Last 3 Encounters:  05/02/19 (!) 158/80  02/14/18 (!) 146/66  02/14/18 (!) 146/66    Wt Readings from Last 3 Encounters:  05/02/19 108 lb (49 kg)  02/14/18 113 lb (51.3 kg)  02/14/18 113 lb (51.3 kg)     Physical Exam Constitutional: She appears well-developed and well-nourished. No distress.  HENT:  Head: Normocephalic and atraumatic.  Right Ear: External ear normal. Normal ear canal and TM Left Ear: External ear normal.  Normal ear canal and TM Mouth/Throat: Oropharynx is clear and moist.  Eyes: Conjunctivae and EOM are normal.  Neck: Neck supple. No tracheal deviation present. No thyromegaly present.  No carotid bruit  Cardiovascular: Normal rate, regular rhythm and normal heart sounds.   3/6 systolic murmur heard.  No edema. Pulmonary/Chest: Effort normal and breath sounds normal. No respiratory distress. She has no wheezes. She has no rales.  Breast: deferred   Abdominal: Soft. She exhibits no distension. There is no tenderness.  Lymphadenopathy: She has no cervical adenopathy.  Skin: Skin is warm and dry. She is not diaphoretic.  Psychiatric: She has a normal mood and affect. Her behavior is normal.        Assessment & Plan:   Physical exam: Screening blood work ordered Immunizations   Flu vaccine today, discussed shingrix  Colonoscopy  N/a due to age 14  N/a due to age 72   Up to date - has pessary Dexa   Up to date  Eye exams    Up to date  Exercise  Not exercising, active Weight   Normal BMI Skin  No concerns, sees derm annually Substance abuse   none  See Problem List for Assessment and Plan of chronic medical problems.   F/u in one year

## 2019-05-02 ENCOUNTER — Encounter: Payer: Self-pay | Admitting: Internal Medicine

## 2019-05-02 ENCOUNTER — Ambulatory Visit: Payer: Medicare Other

## 2019-05-02 ENCOUNTER — Other Ambulatory Visit (INDEPENDENT_AMBULATORY_CARE_PROVIDER_SITE_OTHER): Payer: Medicare Other

## 2019-05-02 ENCOUNTER — Other Ambulatory Visit: Payer: Self-pay

## 2019-05-02 ENCOUNTER — Ambulatory Visit (INDEPENDENT_AMBULATORY_CARE_PROVIDER_SITE_OTHER): Payer: Medicare Other | Admitting: Internal Medicine

## 2019-05-02 VITALS — BP 158/80 | HR 90 | Temp 98.0°F | Resp 16 | Ht 59.0 in | Wt 108.0 lb

## 2019-05-02 DIAGNOSIS — Z23 Encounter for immunization: Secondary | ICD-10-CM

## 2019-05-02 DIAGNOSIS — R011 Cardiac murmur, unspecified: Secondary | ICD-10-CM

## 2019-05-02 DIAGNOSIS — I10 Essential (primary) hypertension: Secondary | ICD-10-CM

## 2019-05-02 DIAGNOSIS — R739 Hyperglycemia, unspecified: Secondary | ICD-10-CM | POA: Diagnosis not present

## 2019-05-02 DIAGNOSIS — Z0000 Encounter for general adult medical examination without abnormal findings: Secondary | ICD-10-CM

## 2019-05-02 DIAGNOSIS — M810 Age-related osteoporosis without current pathological fracture: Secondary | ICD-10-CM

## 2019-05-02 DIAGNOSIS — Z0001 Encounter for general adult medical examination with abnormal findings: Secondary | ICD-10-CM | POA: Diagnosis not present

## 2019-05-02 LAB — LIPID PANEL
Cholesterol: 284 mg/dL — ABNORMAL HIGH (ref 0–200)
HDL: 71.4 mg/dL (ref >39.00)
LDL Cholesterol: 189 mg/dL — ABNORMAL HIGH (ref 0–99)
NonHDL: 212.66
Total CHOL/HDL Ratio: 4
Triglycerides: 117 mg/dL (ref 0.0–149.0)
VLDL: 23.4 mg/dL (ref 0.0–40.0)

## 2019-05-02 LAB — COMPREHENSIVE METABOLIC PANEL
ALT: 12 U/L (ref 0–35)
AST: 16 U/L (ref 0–37)
Albumin: 4.5 g/dL (ref 3.5–5.2)
Alkaline Phosphatase: 68 U/L (ref 39–117)
BUN: 13 mg/dL (ref 6–23)
CO2: 31 mEq/L (ref 19–32)
Calcium: 10 mg/dL (ref 8.4–10.5)
Chloride: 98 mEq/L (ref 96–112)
Creatinine, Ser: 0.65 mg/dL (ref 0.40–1.20)
GFR: 86.96 mL/min (ref >60.00)
Glucose, Bld: 93 mg/dL (ref 70–99)
Potassium: 3.9 mEq/L (ref 3.5–5.1)
Sodium: 136 mEq/L (ref 135–145)
Total Bilirubin: 0.5 mg/dL (ref 0.2–1.2)
Total Protein: 7.8 g/dL (ref 6.0–8.3)

## 2019-05-02 LAB — CBC WITH DIFFERENTIAL/PLATELET
Basophils Absolute: 0.1 10*3/uL (ref 0.0–0.1)
Basophils Relative: 0.7 % (ref 0.0–3.0)
Eosinophils Absolute: 0.1 10*3/uL (ref 0.0–0.7)
Eosinophils Relative: 1.6 % (ref 0.0–5.0)
HCT: 43.6 % (ref 36.0–46.0)
Hemoglobin: 14.8 g/dL (ref 12.0–15.0)
Lymphocytes Relative: 37.9 % (ref 12.0–46.0)
Lymphs Abs: 3.2 10*3/uL (ref 0.7–4.0)
MCHC: 33.9 g/dL (ref 30.0–36.0)
MCV: 91.6 fl (ref 78.0–100.0)
Monocytes Absolute: 0.6 10*3/uL (ref 0.1–1.0)
Monocytes Relative: 7 % (ref 3.0–12.0)
Neutro Abs: 4.4 10*3/uL (ref 1.4–7.7)
Neutrophils Relative %: 52.8 % (ref 43.0–77.0)
Platelets: 316 10*3/uL (ref 150.0–400.0)
RBC: 4.76 Mil/uL (ref 3.87–5.11)
RDW: 12.8 % (ref 11.5–15.5)
WBC: 8.3 10*3/uL (ref 4.0–10.5)

## 2019-05-02 LAB — HEMOGLOBIN A1C: Hgb A1c MFr Bld: 5.7 % (ref 4.6–6.5)

## 2019-05-02 LAB — VITAMIN D 25 HYDROXY (VIT D DEFICIENCY, FRACTURES): VITD: 56.97 ng/mL (ref 30.00–100.00)

## 2019-05-02 LAB — TSH: TSH: 2.44 u[IU]/mL (ref 0.35–4.50)

## 2019-05-02 NOTE — Assessment & Plan Note (Signed)
dexa up to date Not walking, very active Taking vitamin d and calcium

## 2019-05-02 NOTE — Assessment & Plan Note (Addendum)
She has a significant murmur on exam and has never had an echocardiogram I have ordered an ultrasound a few times in the past and she has not gotten it done Discussed at length with her that we really need the ultrasound and why.  Although she is currently asymptomatic I am concerned that that could change.  Most likely she has at least a moderate valve regurgitation Discussed concerning symptoms to monitor for and advised her to call if she experiences any Agreed to have echocardiogram-ordered

## 2019-05-02 NOTE — Assessment & Plan Note (Signed)
a1c

## 2019-05-02 NOTE — Assessment & Plan Note (Addendum)
White coat htn Typically well controlled at home Continue to monitor at home Continue current meds cmp

## 2019-05-13 ENCOUNTER — Encounter: Payer: Self-pay | Admitting: Internal Medicine

## 2019-05-13 DIAGNOSIS — E782 Mixed hyperlipidemia: Secondary | ICD-10-CM

## 2019-05-13 MED ORDER — ROSUVASTATIN CALCIUM 5 MG PO TABS: 5.0000 mg | ORAL_TABLET | ORAL | 3 refills | Status: DC

## 2019-05-17 ENCOUNTER — Other Ambulatory Visit: Payer: Self-pay | Admitting: Internal Medicine

## 2019-07-04 ENCOUNTER — Encounter: Payer: Self-pay | Admitting: Internal Medicine

## 2019-07-06 ENCOUNTER — Encounter: Payer: Self-pay | Admitting: Internal Medicine

## 2019-07-10 DIAGNOSIS — H524 Presbyopia: Secondary | ICD-10-CM | POA: Diagnosis not present

## 2019-07-10 DIAGNOSIS — Z961 Presence of intraocular lens: Secondary | ICD-10-CM | POA: Diagnosis not present

## 2019-07-10 DIAGNOSIS — H40053 Ocular hypertension, bilateral: Secondary | ICD-10-CM | POA: Diagnosis not present

## 2019-07-10 DIAGNOSIS — H26491 Other secondary cataract, right eye: Secondary | ICD-10-CM | POA: Diagnosis not present

## 2019-07-16 ENCOUNTER — Other Ambulatory Visit: Payer: Self-pay | Admitting: Internal Medicine

## 2019-07-30 DIAGNOSIS — H26491 Other secondary cataract, right eye: Secondary | ICD-10-CM | POA: Diagnosis not present

## 2019-08-14 ENCOUNTER — Other Ambulatory Visit: Payer: Self-pay

## 2019-08-14 NOTE — Patient Outreach (Signed)
[facility] [facility]) Care Management  08/14/2019  VICTORI MURMAN [birth year] [id number]   Medication Adherence call to Mrs. [employer] Voice message left with a call back number. Mrs. Genin is showing past due on Rosuvastatin 5 mg under [place].   [place] Management Direct Dial [phone]  Fax [phone] [email or web address]

## 2019-08-19 ENCOUNTER — Other Ambulatory Visit: Payer: Self-pay

## 2019-08-19 NOTE — Patient Outreach (Signed)
[place] [facility]) Care Management  08/19/2019  Renee Figueroa 03-19-1934 [id number]   Medication Adherence call to Renee Figueroa [place] Identifiers Verify spoke with patient she is past due on Rosuvastatin 5 mg,patient explain she takes 1 tablet 3 times a week,patient explain she has pick up this medication last week,patient also said doctor is just trying up this medication because she was having side effects,patient has an appointment in January/2021 and will go over with her doctor. Renee Figueroa is showing past due under [place].  [place] Management Direct Dial [phone]  Fax [phone] [email or web address]

## 2019-08-31 ENCOUNTER — Ambulatory Visit: Payer: Medicare Other | Attending: Internal Medicine

## 2019-08-31 DIAGNOSIS — Z23 Encounter for immunization: Secondary | ICD-10-CM

## 2019-08-31 NOTE — Progress Notes (Signed)
   Covid-19 Vaccination Clinic  Name:  GENIEL BORELLI    MRN: [id number] DOB: 27-May-1934  08/31/2019  Ms. Salom was observed post Covid-19 immunization for 15 minutes without incidence. She was provided with Vaccine Information Sheet and instruction to access the V-Safe system.   Ms. Brownley was instructed to call 911 with any severe reactions post vaccine: Marland Kitchen Difficulty breathing  . Swelling of your face and throat  . A fast heartbeat  . A bad rash all over your body  . Dizziness and weakness    Immunizations Administered    Name Date Dose VIS Date Route   Pfizer COVID-19 Vaccine 08/31/2019 12:07 PM 0.3 mL 08/02/2019 Intramuscular   Manufacturer: [place]   Lot: [id number]   [place]: [id number]

## 2019-09-18 ENCOUNTER — Ambulatory Visit: Payer: Medicare Other

## 2019-09-21 ENCOUNTER — Ambulatory Visit: Payer: Medicare Other | Attending: Internal Medicine

## 2019-09-21 DIAGNOSIS — Z23 Encounter for immunization: Secondary | ICD-10-CM | POA: Insufficient documentation

## 2019-09-21 NOTE — Progress Notes (Signed)
   Covid-19 Vaccination Clinic  Name:  Renee Figueroa    MRN: [id number] DOB: Jun 05, 1934  09/21/2019  [name] was observed post Covid-19 immunization for 15 minutes without incidence. She was provided with Vaccine Information Sheet and instruction to access the V-Safe system.   [name] was instructed to call 911 with any severe reactions post vaccine: Marland Kitchen Difficulty breathing  . Swelling of your face and throat  . A fast heartbeat  . A bad rash all over your body  . Dizziness and weakness    Immunizations Administered    Name Date Dose VIS Date Route   Pfizer COVID-19 Vaccine 09/21/2019  2:31 PM 0.3 mL 08/02/2019 Intramuscular   Manufacturer: [place]   Lot: [id number]   [place]: [id number]

## 2019-10-23 ENCOUNTER — Other Ambulatory Visit: Payer: Self-pay | Admitting: Internal Medicine

## 2020-04-24 ENCOUNTER — Ambulatory Visit: Payer: Medicare Other | Attending: Internal Medicine

## 2020-04-24 DIAGNOSIS — Z23 Encounter for immunization: Secondary | ICD-10-CM

## 2020-04-24 NOTE — Progress Notes (Signed)
   Covid-19 Vaccination Clinic  Name:  Renee Figueroa    MRN: [id number] DOB: [birth year]  04/24/2020  Renee Figueroa was observed post Covid-19 immunization for 15 minutes without incident. She was provided with Vaccine Information Sheet and instruction to access the V-Safe system.   Renee Figueroa was instructed to call 911 with any severe reactions post vaccine: Marland Kitchen Difficulty breathing  . Swelling of face and throat  . A fast heartbeat  . A bad rash all over body  . Dizziness and weakness

## 2020-05-03 NOTE — Progress Notes (Signed)
Subjective:    Patient ID: Renee Figueroa, female    DOB: 05-08-1934, 86 y.o.   MRN: [id number]  HPI She is here for a physical exam.   Her BP at home is better - when she is not around a doctor or nurse.  She is taking her medication daily.  Overall she feels well and has no concerns.  Medications and allergies reviewed with patient and updated if appropriate.  Patient Active Problem List   Diagnosis Date Noted  . Hyperglycemia 05/01/2019  . Uses hearing aid 02/14/2018  . Heart murmur 02/12/2016  . COLONIC POLYPS, ADENOMATOUS 01/19/2010  . Allergic rhinitis 01/19/2010  . DEGENERATIVE JOINT DISEASE 01/19/2010  . SKIN CANCER, HX OF 01/19/2010  . Hyperlipidemia 07/27/2007  . Essential hypertension 07/27/2007  . Osteoporosis 07/27/2007    Current Outpatient Medications on File Prior to Visit  Medication Sig Dispense Refill  . amLODipine (NORVASC) 10 MG tablet TAKE 1 TABLET BY MOUTH DAILY 90 tablet 3  . fluticasone (FLONASE) 50 MCG/ACT nasal spray Place into both nostrils daily.    . hydrochlorothiazide (HYDRODIURIL) 25 MG tablet TAKE 1/2 TABLET(12.5 MG) BY MOUTH DAILY (Patient not taking: Reported on 05/04/2020) 45 tablet 3  . rosuvastatin (CRESTOR) 5 MG tablet Take 1 tablet (5 mg total) by mouth 2 (two) times a week. (Patient not taking: Reported on 05/04/2020) 13 tablet 3   No current facility-administered medications on file prior to visit.    Past Medical History:  Diagnosis Date  . Allergy    seasonal  . Arthritis    mild  . Basal cell carcinoma of anterior chest    skin  . Colon polyps    adenomatous  . Diverticulosis   . Heart murmur   . Herpes zoster 04/2011   C 6  LUE  . Hyperlipidemia   . Hypertension   . Osteoporosis   . Renal calculi 1962    X 1     Past Surgical History:  Procedure Laterality Date  . colonoscopy with polypectomy  2004    adenomatous; Dr Henrene Pastor. Neg 2013  . DILATION AND CURETTAGE OF UTERUS     age 13  . G 2 P 2    .  TONSILLECTOMY AND ADENOIDECTOMY      Social History   Socioeconomic History  . Marital status: Married    Spouse name: Not on file  . Number of children: 2  . Years of education: Not on file  . Highest education level: Not on file  Occupational History  . Not on file  Tobacco Use  . Smoking status: Never Smoker  . Smokeless tobacco: Never Used  Vaping Use  . Vaping Use: Never used  Substance and Sexual Activity  . Alcohol use: Yes    Comment: maybe 1 drink a month  . Drug use: No  . Sexual activity: Not Currently  Other Topics Concern  . Not on file  Social History Narrative  . Not on file   Social Determinants of Health   Financial Resource Strain:   . Difficulty of Paying Living Expenses: Not on file  Food Insecurity:   . Worried About Charity fundraiser in the Last Year: Not on file  . Ran Out of Food in the Last Year: Not on file  Transportation Needs:   . Lack of Transportation (Medical): Not on file  . Lack of Transportation (Non-Medical): Not on file  Physical Activity:   . Days of Exercise per Week:  Not on file  . Minutes of Exercise per Session: Not on file  Stress:   . Feeling of Stress : Not on file  Social Connections:   . Frequency of Communication with Friends and Family: Not on file  . Frequency of Social Gatherings with Friends and Family: Not on file  . Attends Religious Services: Not on file  . Active Member of Clubs or Organizations: Not on file  . Attends Archivist Meetings: Not on file  . Marital Status: Not on file    Family History  Problem Relation Age of Onset  . Hypertension Mother   . Stomach cancer Mother        died @ 39  . Heart attack Father 77  . Colon cancer Neg Hx   . Colon polyps Neg Hx   . Rectal cancer Neg Hx   . Diabetes Neg Hx   . Stroke Neg Hx     Review of Systems  Constitutional: Negative for chills and fever.  Eyes: Negative for visual disturbance.  Respiratory: Negative for cough, shortness of  breath and wheezing.   Cardiovascular: Negative for chest pain, palpitations and leg swelling.  Gastrointestinal: Negative for abdominal pain, blood in stool, constipation, diarrhea and nausea.       No gerd  Genitourinary: Negative for dysuria.  Musculoskeletal: Positive for back pain (occ if she does too much). Negative for arthralgias.  Skin: Negative for color change and rash.  Neurological: Negative for light-headedness and headaches.  Psychiatric/Behavioral: Negative for dysphoric mood. The patient is not nervous/anxious.        Objective:   Vitals:   05/04/20 0907  BP: (!) 158/80  Pulse: 88  Temp: 97.9 F (36.6 C)  SpO2: 96%   Filed Weights   05/04/20 0907  Weight: 105 lb 6.4 oz (47.8 kg)   Body mass index is 21.29 kg/m.  BP Readings from Last 3 Encounters:  05/04/20 (!) 158/80  05/02/19 (!) 158/80  02/14/18 (!) 146/66    Wt Readings from Last 3 Encounters:  05/04/20 105 lb 6.4 oz (47.8 kg)  05/02/19 108 lb (49 kg)  02/14/18 113 lb (51.3 kg)     Physical Exam Constitutional: She appears well-developed and well-nourished. No distress.  HENT:  Head: Normocephalic and atraumatic.  Right Ear: External ear normal. Normal ear canal and TM Left Ear: External ear normal.  Normal ear canal and TM Mouth/Throat: Oropharynx is clear and moist.  Eyes: Conjunctivae and EOM are normal.  Neck: Neck supple. No tracheal deviation present. No thyromegaly present.  No carotid bruit  Cardiovascular: Normal rate, regular rhythm and normal heart sounds.   3/6 systolic murmur heard.  No edema. Pulmonary/Chest: Effort normal and breath sounds normal. No respiratory distress. She has no wheezes. She has no rales.  Breast: deferred   Abdominal: Soft. She exhibits no distension. There is no tenderness.  Lymphadenopathy: She has no cervical adenopathy.  Skin: Skin is warm and dry. She is not diaphoretic.  Psychiatric: She has a normal mood and affect. Her behavior is normal.         Assessment & Plan:   Physical exam: Screening blood work    ordered Immunizations  Had covid booster.   Had flu vaccine.  Discussed shingrix Colonoscopy  N/a due to age 6  N/a due to age 41  n/a Dexa   Due - deferred due to pandemic Eye exams  Up to date  Exercise   Walks outside Weight  Normal  Substance abuse   none   See Problem List for Assessment and Plan of chronic medical problems.   This visit occurred during the SARS-CoV-2 public health emergency.  Safety protocols were in place, including screening questions prior to the visit, additional usage of staff PPE, and extensive cleaning of exam room while observing appropriate contact time as indicated for disinfecting solutions.

## 2020-05-03 NOTE — Patient Instructions (Addendum)
Blood work was ordered.    All other Health Maintenance issues reviewed.   All recommended immunizations and age-appropriate screenings are up-to-date or discussed.  Flu immunization administered today. An Echo or Ultrasound of your heart was ordered.   Medications reviewed and updated.  Changes include :   none  Your prescription(s) have been submitted to your pharmacy. Please take as directed and contact our office if you believe you are having problem(s) with the medication(s).  We will look into prolia for you.    Please followup in 1 year    Health Maintenance, Female Adopting a healthy lifestyle and getting preventive care are important in promoting health and wellness. Ask your health care provider about:  The right schedule for you to have regular tests and exams.  Things you can do on your own to prevent diseases and keep yourself healthy. What should I know about diet, weight, and exercise? Eat a healthy diet   Eat a diet that includes plenty of vegetables, fruits, low-fat dairy products, and lean protein.  Do not eat a lot of foods that are high in solid fats, added sugars, or sodium. Maintain a healthy weight Body mass index (BMI) is used to identify weight problems. It estimates body fat based on height and weight. Your health care provider can help determine your BMI and help you achieve or maintain a healthy weight. Get regular exercise Get regular exercise. This is one of the most important things you can do for your health. Most adults should:  Exercise for at least 150 minutes each week. The exercise should increase your heart rate and make you sweat (moderate-intensity exercise).  Do strengthening exercises at least twice a week. This is in addition to the moderate-intensity exercise.  Spend less time sitting. Even light physical activity can be beneficial. Watch cholesterol and blood lipids Have your blood tested for lipids and cholesterol at 84 years of  age, then have this test every 5 years. Have your cholesterol levels checked more often if:  Your lipid or cholesterol levels are high.  You are older than 84 years of age.  You are at high risk for heart disease. What should I know about cancer screening? Depending on your health history and family history, you may need to have cancer screening at various ages. This may include screening for:  Breast cancer.  Cervical cancer.  Colorectal cancer.  Skin cancer.  Lung cancer. What should I know about heart disease, diabetes, and high blood pressure? Blood pressure and heart disease  High blood pressure causes heart disease and increases the risk of stroke. This is more likely to develop in people who have high blood pressure readings, are of African descent, or are overweight.  Have your blood pressure checked: ? Every 3-5 years if you are 84-84 years of age. ? Every year if you are 84 years old or older. Diabetes Have regular diabetes screenings. This checks your fasting blood sugar level. Have the screening done:  Once every three years after age 84 if you are at a normal weight and have a low risk for diabetes.  More often and at a younger age if you are overweight or have a high risk for diabetes. What should I know about preventing infection? Hepatitis B If you have a higher risk for hepatitis B, you should be screened for this virus. Talk with your health care provider to find out if you are at risk for hepatitis B infection. Hepatitis C Testing is  recommended for:  Everyone born from [birth year] through 1963.  Anyone with known risk factors for hepatitis C. Sexually transmitted infections (STIs)  Get screened for STIs, including gonorrhea and chlamydia, if: ? You are sexually active and are younger than 84 years of age. ? You are older than 84 years of age and your health care provider tells you that you are at risk for this type of infection. ? Your sexual activity has  changed since you were last screened, and you are at increased risk for chlamydia or gonorrhea. Ask your health care provider if you are at risk.  Ask your health care provider about whether you are at high risk for HIV. Your health care provider may recommend a prescription medicine to help prevent HIV infection. If you choose to take medicine to prevent HIV, you should first get tested for HIV. You should then be tested every 3 months for as long as you are taking the medicine. Pregnancy  If you are about to stop having your period (premenopausal) and you may become pregnant, seek counseling before you get pregnant.  Take 400 to 800 micrograms (mcg) of folic acid every day if you become pregnant.  Ask for birth control (contraception) if you want to prevent pregnancy. Osteoporosis and menopause Osteoporosis is a disease in which the bones lose minerals and strength with aging. This can result in bone fractures. If you are 61 years old or older, or if you are at risk for osteoporosis and fractures, ask your health care provider if you should:  Be screened for bone loss.  Take a calcium or vitamin D supplement to lower your risk of fractures.  Be given hormone replacement therapy (HRT) to treat symptoms of menopause. Follow these instructions at home: Lifestyle  Do not use any products that contain nicotine or tobacco, such as cigarettes, e-cigarettes, and chewing tobacco. If you need help quitting, ask your health care provider.  Do not use street drugs.  Do not share needles.  Ask your health care provider for help if you need support or information about quitting drugs. Alcohol use  Do not drink alcohol if: ? Your health care provider tells you not to drink. ? You are pregnant, may be pregnant, or are planning to become pregnant.  If you drink alcohol: ? Limit how much you use to 0-1 drink a day. ? Limit intake if you are breastfeeding.  Be aware of how much alcohol is in  your drink. In the [place] one drink equals one 12 oz bottle of beer (355 mL), one 5 oz glass of wine (148 mL), or one 1 oz glass of hard liquor (44 mL). General instructions  Schedule regular health, dental, and eye exams.  Stay current with your vaccines.  Tell your health care provider if: ? You often feel depressed. ? You have ever been abused or do not feel safe at home. Summary  Adopting a healthy lifestyle and getting preventive care are important in promoting health and wellness.  Follow your health care provider's instructions about healthy diet, exercising, and getting tested or screened for diseases.  Follow your health care provider's instructions on monitoring your cholesterol and blood pressure. This information is not intended to replace advice given to you by your health care provider. Make sure you discuss any questions you have with your health care provider. Document Revised: 08/01/2018 Document Reviewed: 08/01/2018 Elsevier Patient Education  2020 [employer].

## 2020-05-04 ENCOUNTER — Other Ambulatory Visit: Payer: Self-pay

## 2020-05-04 ENCOUNTER — Ambulatory Visit (INDEPENDENT_AMBULATORY_CARE_PROVIDER_SITE_OTHER): Payer: Medicare Other | Admitting: Internal Medicine

## 2020-05-04 ENCOUNTER — Encounter: Payer: Self-pay | Admitting: Internal Medicine

## 2020-05-04 VITALS — BP 158/80 | HR 88 | Temp 97.9°F | Ht 59.0 in | Wt 105.4 lb

## 2020-05-04 DIAGNOSIS — I10 Essential (primary) hypertension: Secondary | ICD-10-CM | POA: Diagnosis not present

## 2020-05-04 DIAGNOSIS — R011 Cardiac murmur, unspecified: Secondary | ICD-10-CM

## 2020-05-04 DIAGNOSIS — R739 Hyperglycemia, unspecified: Secondary | ICD-10-CM | POA: Diagnosis not present

## 2020-05-04 DIAGNOSIS — Z23 Encounter for immunization: Secondary | ICD-10-CM

## 2020-05-04 DIAGNOSIS — Z0000 Encounter for general adult medical examination without abnormal findings: Secondary | ICD-10-CM | POA: Diagnosis not present

## 2020-05-04 DIAGNOSIS — M810 Age-related osteoporosis without current pathological fracture: Secondary | ICD-10-CM

## 2020-05-04 DIAGNOSIS — I350 Nonrheumatic aortic (valve) stenosis: Secondary | ICD-10-CM

## 2020-05-04 DIAGNOSIS — E782 Mixed hyperlipidemia: Secondary | ICD-10-CM

## 2020-05-04 MED ORDER — HYDROCHLOROTHIAZIDE 25 MG PO TABS: ORAL_TABLET | ORAL | 3 refills | Status: DC

## 2020-05-04 MED ORDER — AMLODIPINE BESYLATE 10 MG PO TABS: ORAL_TABLET | ORAL | 3 refills | Status: DC

## 2020-05-04 MED ORDER — CALCIUM 600+D 600-800 MG-UNIT PO TABS: ORAL_TABLET | ORAL | Status: DC

## 2020-05-04 NOTE — Assessment & Plan Note (Signed)
Chronic Stressed that she needs to have an echocardiogram to evaluate her heart-she has canceled this in the past, but agrees to have 1-ordered Asymptomatic

## 2020-05-04 NOTE — Assessment & Plan Note (Signed)
Chronic She did take Crestor for a while, but stopped it and states she does not want to take it Will check lipid panel Discussed increased risk of heart attacks, stroke and dementia with having high cholesterol Continue regular exercise and healthy diet

## 2020-05-04 NOTE — Assessment & Plan Note (Addendum)
Chronic Taking calcium/vitamin d Did not tolerate fosamax due to GI intolerance, reclast - did not like that-did not like getting the infusion in the last time she had it it did not go smoothly deferred dexa due to pandemic Would like to look into prolia Stressed regular exercise

## 2020-05-04 NOTE — Assessment & Plan Note (Signed)
Chronic Check A1c 

## 2020-05-04 NOTE — Assessment & Plan Note (Signed)
Chronic Has whitecoat hypertension and her blood pressure is well controlled at home when she is not around the doctor or nurse Continue amlodipine and hydrochlorothiazide at current doses Continue to monitor BP at home CMP

## 2020-05-05 ENCOUNTER — Encounter: Payer: Self-pay | Admitting: Internal Medicine

## 2020-05-05 LAB — CBC WITH DIFFERENTIAL/PLATELET
Absolute Monocytes: 521 cells/uL (ref 200–950)
Basophils Absolute: 40 cells/uL (ref 0–200)
Basophils Relative: 0.5 %
Eosinophils Absolute: 119 cells/uL (ref 15–500)
Eosinophils Relative: 1.5 %
HCT: 45.5 % — ABNORMAL HIGH (ref 35.0–45.0)
Hemoglobin: 14.9 g/dL (ref 11.7–15.5)
Lymphs Abs: 3247 cells/uL (ref 850–3900)
MCH: 30.1 pg (ref 27.0–33.0)
MCHC: 32.7 g/dL (ref 32.0–36.0)
MCV: 91.9 fL (ref 80.0–100.0)
MPV: 10.3 fL (ref 7.5–12.5)
Monocytes Relative: 6.6 %
Neutro Abs: 3974 cells/uL (ref 1500–7800)
Neutrophils Relative %: 50.3 %
Platelets: 332 10*3/uL (ref 140–400)
RBC: 4.95 10*6/uL (ref 3.80–5.10)
RDW: 12.6 % (ref 11.0–15.0)
Total Lymphocyte: 41.1 %
WBC: 7.9 10*3/uL (ref 3.8–10.8)

## 2020-05-05 LAB — VITAMIN D 25 HYDROXY (VIT D DEFICIENCY, FRACTURES): Vit D, 25-Hydroxy: 46 ng/mL (ref 30–100)

## 2020-05-05 LAB — COMPREHENSIVE METABOLIC PANEL
AG Ratio: 1.7 (calc) (ref 1.0–2.5)
ALT: 12 U/L (ref 6–29)
AST: 16 U/L (ref 10–35)
Albumin: 4.7 g/dL (ref 3.6–5.1)
Alkaline phosphatase (APISO): 72 U/L (ref 37–153)
BUN: 16 mg/dL (ref 7–25)
CO2: 28 mmol/L (ref 20–32)
Calcium: 10.1 mg/dL (ref 8.6–10.4)
Chloride: 100 mmol/L (ref 98–110)
Creat: 0.69 mg/dL (ref 0.60–0.88)
Globulin: 2.8 g/dL (calc) (ref 1.9–3.7)
Glucose, Bld: 101 mg/dL — ABNORMAL HIGH (ref 65–99)
Potassium: 4.3 mmol/L (ref 3.5–5.3)
Sodium: 138 mmol/L (ref 135–146)
Total Bilirubin: 0.5 mg/dL (ref 0.2–1.2)
Total Protein: 7.5 g/dL (ref 6.1–8.1)

## 2020-05-05 LAB — LIPID PANEL
Cholesterol: 266 mg/dL — ABNORMAL HIGH (ref <200)
HDL: 79 mg/dL (ref >=50)
LDL Cholesterol (Calc): 164 mg/dL (calc) — ABNORMAL HIGH
Non-HDL Cholesterol (Calc): 187 mg/dL (calc) — ABNORMAL HIGH (ref <130)
Total CHOL/HDL Ratio: 3.4 (calc) (ref <5.0)
Triglycerides: 111 mg/dL (ref <150)

## 2020-05-05 LAB — HEMOGLOBIN A1C
Hgb A1c MFr Bld: 5.5 % of total Hgb (ref <5.7)
Mean Plasma Glucose: 111 (calc)
eAG (mmol/L): 6.2 (calc)

## 2020-05-05 LAB — TSH: TSH: 2.03 mIU/L (ref 0.40–4.50)

## 2020-05-05 MED ORDER — ROSUVASTATIN CALCIUM 5 MG PO TABS: 5.0000 mg | ORAL_TABLET | ORAL | 3 refills | Status: DC

## 2020-05-20 ENCOUNTER — Other Ambulatory Visit: Payer: Self-pay

## 2020-05-20 ENCOUNTER — Ambulatory Visit (HOSPITAL_COMMUNITY): Payer: Medicare Other | Attending: Internal Medicine

## 2020-05-20 DIAGNOSIS — R011 Cardiac murmur, unspecified: Secondary | ICD-10-CM

## 2020-05-21 DIAGNOSIS — I350 Nonrheumatic aortic (valve) stenosis: Secondary | ICD-10-CM | POA: Insufficient documentation

## 2020-05-21 LAB — ECHOCARDIOGRAM COMPLETE
AR max vel: 0.84 cm2
AV Area VTI: 0.9 cm2
AV Area mean vel: 0.88 cm2
AV Mean grad: 40 mmHg
AV Peak grad: 60.8 mmHg
Ao pk vel: 3.9 m/s
Area-P 1/2: 2.45 cm2
S' Lateral: 1.3 cm

## 2020-05-21 NOTE — Addendum Note (Signed)
Addended by: Binnie Rail on: 05/21/2020 07:42 AM   Modules accepted: Orders

## 2020-06-08 NOTE — Progress Notes (Signed)
Cardiology Office Note:    Date:  06/10/2020   ID:  Renee Figueroa, DOB 1934-02-18, MRN [id number]  PCP:  Binnie Rail, MD  Cardiologist:  No primary care provider on file.  Electrophysiologist:  None   Referring MD: Binnie Rail, MD   Chief Complaint  Patient presents with  . Aortic Stenosis    History of Present Illness:    Renee Figueroa is a 86 y.o. female with a hx of hypertension, hyperlipidemia who is referred by Dr. Quay Burow for evaluation of aortic stenosis.  She denies any chest pain, shortness of breath, lightheadedness, or syncope.  Denies any palpitations or lower extremity edema.  Reports she does not exercise regularly.  Most exertion she does is walking around her house or walking the dog.  Reports BP is usually under good control at home, typically 120s.  No smoking history.  Father died of MI at age 4.  Echocardiogram on 05/20/2020 showed EF 70 to 95%, grade 1 diastolic dysfunction, severe mid cavitary obstruction (peak gradient 90 mmHg), normal RV function, severe aortic stenosis (mean gradient 40 mmHg).  Alternate imaging recommended to better assess aortic stenosis versus high flow state.  Past Medical History:  Diagnosis Date  . Allergy    seasonal  . Arthritis    mild  . Basal cell carcinoma of anterior chest    skin  . Colon polyps    adenomatous  . Diverticulosis   . Heart murmur   . Herpes zoster 04/2011   C 6  LUE  . Hyperlipidemia   . Hypertension   . Osteoporosis   . Renal calculi 1962    X 1     Past Surgical History:  Procedure Laterality Date  . colonoscopy with polypectomy  2004    adenomatous; Dr Henrene Pastor. Neg 2013  . DILATION AND CURETTAGE OF UTERUS     age 89  . G 2 P 2    . TONSILLECTOMY AND ADENOIDECTOMY      Current Medications: Current Meds  Medication Sig  . amLODipine (NORVASC) 10 MG tablet TAKE 1 TABLET BY MOUTH DAILY  . Calcium Carb-Cholecalciferol (CALCIUM 600+D) 600-800 MG-UNIT TABS Taking daily  . fluticasone  (FLONASE) 50 MCG/ACT nasal spray Place into both nostrils daily.  . rosuvastatin (CRESTOR) 5 MG tablet Take 1 tablet (5 mg total) by mouth daily.  . [DISCONTINUED] hydrochlorothiazide (HYDRODIURIL) 25 MG tablet TAKE 1/2 TABLET(12.5 MG) BY MOUTH DAILY  . [DISCONTINUED] rosuvastatin (CRESTOR) 5 MG tablet Take 1 tablet (5 mg total) by mouth 2 (two) times a week.     Allergies:   Patient has no active allergies.   Social History   Socioeconomic History  . Marital status: Married    Spouse name: Not on file  . Number of children: 2  . Years of education: Not on file  . Highest education level: Not on file  Occupational History  . Not on file  Tobacco Use  . Smoking status: Never Smoker  . Smokeless tobacco: Never Used  Vaping Use  . Vaping Use: Never used  Substance and Sexual Activity  . Alcohol use: Yes    Comment: maybe 1 drink a month  . Drug use: No  . Sexual activity: Not Currently  Other Topics Concern  . Not on file  Social History Narrative  . Not on file   Social Determinants of Health   Financial Resource Strain:   . Difficulty of Paying Living Expenses: Not on file  Food  Insecurity:   . Worried About Charity fundraiser in the Last Year: Not on file  . Ran Out of Food in the Last Year: Not on file  Transportation Needs:   . Lack of Transportation (Medical): Not on file  . Lack of Transportation (Non-Medical): Not on file  Physical Activity:   . Days of Exercise per Week: Not on file  . Minutes of Exercise per Session: Not on file  Stress:   . Feeling of Stress : Not on file  Social Connections:   . Frequency of Communication with Friends and Family: Not on file  . Frequency of Social Gatherings with Friends and Family: Not on file  . Attends Religious Services: Not on file  . Active Member of Clubs or Organizations: Not on file  . Attends Archivist Meetings: Not on file  . Marital Status: Not on file     Family History: The patient's family  history includes Heart attack (age of onset: 56) in her father; Hypertension in her mother; Stomach cancer in her mother. There is no history of Colon cancer, Colon polyps, Rectal cancer, Diabetes, or Stroke.  ROS:   Please see the history of present illness.     All other systems reviewed and are negative.  EKGs/Labs/Other Studies Reviewed:    The following studies were reviewed today:   EKG:  EKG is ordered today.  The ekg ordered today demonstrates sinus tachycardia, rate 101, Q wave V2, aVL  Recent Labs: 05/04/2020: ALT 12; BUN 16; Creat 0.69; Hemoglobin 14.9; Platelets 332; Potassium 4.3; Sodium 138; TSH 2.03  Recent Lipid Panel    Component Value Date/Time   CHOL 266 (H) 05/04/2020 1007   TRIG 111 05/04/2020 1007   HDL 79 05/04/2020 1007   CHOLHDL 3.4 05/04/2020 1007   VLDL 23.4 05/02/2019 0951   LDLCALC 164 (H) 05/04/2020 1007   LDLDIRECT 156.8 10/24/2012 1207    Physical Exam:    VS:  BP (!) 156/80   Pulse 100   Ht 4' 11" (1.499 m)   Wt 104 lb (47.2 kg)   SpO2 98%   BMI 21.01 kg/m     Wt Readings from Last 3 Encounters:  06/10/20 104 lb (47.2 kg)  05/04/20 105 lb 6.4 oz (47.8 kg)  05/02/19 108 lb (49 kg)     GEN: Well nourished, well developed in no acute distress HEENT: Normal NECK: No JVD LYMPHATICS: No lymphadenopathy CARDIAC: RRR, 3/6 systolic murmur loudest at RUSB, radiates to carotids RESPIRATORY:  Clear to auscultation without rales, wheezing or rhonchi  ABDOMEN: Soft, non-tender, non-distended MUSCULOSKELETAL:  No edema; No deformity  SKIN: Warm and dry NEUROLOGIC:  Alert and oriented x 3 PSYCHIATRIC:  Normal affect   ASSESSMENT:    1. Aortic stenosis, severe   2. Essential hypertension   3. Hyperlipidemia, unspecified hyperlipidemia type    PLAN:    Aortic stenosis: Echo shows Vmax 4.1, MG 42 mmHG, AVA 0.9 cm^2, DI 0.39. Suspect severe AS though high flow state could also be contributing to elevated gradients on echo, as DI only 0.39.   Appears asymptomatic. -Will evaluate further with cardiac CT to better assess aortic valve morphology including aortic valve calcium score to evaluate AS severity -Concerned that diuretic may be causing dehydration and leading to elevated mid cavitary gradient.  Will discontinue HCTZ and start losartan 25 mg daily  Hypertension: On amlodipine 10 mg daily and hydrochlorothiazide 12.5 mg daily.  BP elevated in clinic today.  Will discontinue  HCTZ as above and start losartan 25 mg daily.  Check BMP in 1 week.  Asked patient to check BP daily for next 2 weeks and call with results.  Hyperlipidemia: On rosuvastatin 5 mg twice per week.  LDL 164 on 05/04/20.  Increase rosuvastatin to daily.  RTC in 3 months   Medication Adjustments/Labs and Tests Ordered: Current medicines are reviewed at length with the patient today.  Concerns regarding medicines are outlined above.  Orders Placed This Encounter  Procedures  . CT CORONARY MORPH W/CTA COR W/SCORE W/CA W/CM &/OR WO/CM  . Basic metabolic panel  . EKG 12-Lead   Meds ordered this encounter  Medications  . rosuvastatin (CRESTOR) 5 MG tablet    Sig: Take 1 tablet (5 mg total) by mouth daily.    Dispense:  90 tablet    Refill:  3  . losartan (COZAAR) 25 MG tablet    Sig: Take 1 tablet (25 mg total) by mouth daily.    Dispense:  90 tablet    Refill:  3  . metoprolol tartrate (LOPRESSOR) 50 MG tablet    Sig: Take 50 mg (1 tablet) 2 hours prior to CT scan.  HOLD LOSARTAN    Dispense:  1 tablet    Refill:  0    Patient Instructions  Medication Instructions:  STOP hydrochlorothiazide (HCTZ) START Losartan 25 mg daily INCREASE rosuvastatin (Crestor) to daily  *If you need a refill on your cardiac medications before your next appointment, please call your pharmacy*   Lab Work: Please return for FASTING labs in 1 week (BMET)  Our in office lab hours are Monday-Friday 8:00-4:00, closed for lunch 12:45-1:45 pm.  No appointment  needed.  Testing/Procedures: CTA to evaluate the aortic valve-see instructions below  Follow-Up: At [facility], you and your health needs are our priority.  As part of our continuing mission to provide you with exceptional heart care, we have created designated Provider Care Teams.  These Care Teams include your primary Cardiologist (physician) and Advanced Practice Providers (APPs -  Physician Assistants and Nurse Practitioners) who all work together to provide you with the care you need, when you need it.  We recommend signing up for the patient portal called "MyChart".  Sign up information is provided on this After Visit Summary.  MyChart is used to connect with patients for Virtual Visits (Telemedicine).  Patients are able to view lab/test results, encounter notes, upcoming appointments, etc.  Non-urgent messages can be sent to your provider as well.   To learn more about what you can do with MyChart, go to [email or web address].    Your next appointment:   3 month(s)  The format for your next appointment:   In Person  Provider:   Oswaldo Milian, MD   ---Please check your blood pressure at home daily, write it down.  Call the office or send message via Mychart with the readings in 2 weeks for Dr. Gardiner Rhyme to review.    Other Instructions Your cardiac CT will be scheduled at one of the below locations:   [facility] [address] [place], [place] [address] [phone]  If scheduled at [facility], please arrive at the [facility] main entrance of [facility] 30 minutes prior to test start time. Proceed to the [facility] Radiology Department (first floor) to check-in and test prep.  Please follow these instructions carefully (unless otherwise directed):  On the Night Before the Test: . Be sure to Drink plenty of  water. . Do not consume any caffeinated/decaffeinated beverages or chocolate 12 hours prior to your test. . Do not take  any antihistamines 12 hours prior to your test.  On the Day of the Test: . Drink plenty of water. Do not drink any water within one hour of the test. . Do not eat any food 4 hours prior to the test. . You may take your regular medications prior to the test.  . Take metoprolol (Lopressor) two hours prior to test. . HOLD Losartan AM of CT . FEMALES- please wear underwire-free bra if available      After the Test: . Drink plenty of water. . After receiving IV contrast, you may experience a mild flushed feeling. This is normal. . On occasion, you may experience a mild rash up to 24 hours after the test. This is not dangerous. If this occurs, you can take Benadryl 25 mg and increase your fluid intake. . If you experience trouble breathing, this can be serious. If it is severe call 911 IMMEDIATELY. If it is mild, please call our office. . If you take any of these medications: Glipizide/Metformin, Avandament, Glucavance, please do not take 48 hours after completing test unless otherwise instructed.   Once we have confirmed authorization from your insurance company, we will call you to set up a date and time for your test. Based on how quickly your insurance processes prior authorizations requests, please allow up to 4 weeks to be contacted for scheduling your Cardiac CT appointment. Be advised that routine Cardiac CT appointments could be scheduled as many as 8 weeks after your provider has ordered it.  For non-scheduling related questions, please contact the cardiac imaging nurse navigator should you have any questions/concerns: Marchia Bond, Cardiac Imaging Nurse Navigator Burley Saver, Interim Cardiac Imaging Nurse [place] and Vascular Services Direct Office Dial: [phone]   For scheduling needs, including cancellations and rescheduling, please call Vivien Rota at [phone], option 3.        Signed, Donato Heinz, MD  06/10/2020 6:23 PM    [place]

## 2020-06-10 ENCOUNTER — Ambulatory Visit (INDEPENDENT_AMBULATORY_CARE_PROVIDER_SITE_OTHER): Payer: Medicare Other | Admitting: Cardiology

## 2020-06-10 ENCOUNTER — Other Ambulatory Visit: Payer: Self-pay

## 2020-06-10 VITALS — BP 156/80 | HR 100 | Ht 59.0 in | Wt 104.0 lb

## 2020-06-10 DIAGNOSIS — E785 Hyperlipidemia, unspecified: Secondary | ICD-10-CM | POA: Diagnosis not present

## 2020-06-10 DIAGNOSIS — I350 Nonrheumatic aortic (valve) stenosis: Secondary | ICD-10-CM | POA: Diagnosis not present

## 2020-06-10 DIAGNOSIS — I10 Essential (primary) hypertension: Secondary | ICD-10-CM | POA: Diagnosis not present

## 2020-06-10 MED ORDER — LOSARTAN POTASSIUM 25 MG PO TABS: 25.0000 mg | ORAL_TABLET | Freq: Every day | ORAL | 3 refills | Status: DC

## 2020-06-10 MED ORDER — METOPROLOL TARTRATE 50 MG PO TABS: ORAL_TABLET | ORAL | 0 refills | Status: DC

## 2020-06-10 MED ORDER — ROSUVASTATIN CALCIUM 5 MG PO TABS: 5.0000 mg | ORAL_TABLET | Freq: Every day | ORAL | 3 refills | Status: DC

## 2020-06-10 NOTE — Patient Instructions (Addendum)
Medication Instructions:  STOP hydrochlorothiazide (HCTZ) START Losartan 25 mg daily INCREASE rosuvastatin (Crestor) to daily  *If you need a refill on your cardiac medications before your next appointment, please call your pharmacy*   Lab Work: Please return for FASTING labs in 1 week (BMET)  Our in office lab hours are Monday-Friday 8:00-4:00, closed for lunch 12:45-1:45 pm.  No appointment needed.  Testing/Procedures: CTA to evaluate the aortic valve-see instructions below  Follow-Up: At [facility], you and your health needs are our priority.  As part of our continuing mission to provide you with exceptional heart care, we have created designated Provider Care Teams.  These Care Teams include your primary Cardiologist (physician) and Advanced Practice Providers (APPs -  Physician Assistants and Nurse Practitioners) who all work together to provide you with the care you need, when you need it.  We recommend signing up for the patient portal called "MyChart".  Sign up information is provided on this After Visit Summary.  MyChart is used to connect with patients for Virtual Visits (Telemedicine).  Patients are able to view lab/test results, encounter notes, upcoming appointments, etc.  Non-urgent messages can be sent to your provider as well.   To learn more about what you can do with MyChart, go to [email or web address].    Your next appointment:   3 month(s)  The format for your next appointment:   In Person  Provider:   Oswaldo Milian, MD   ---Please check your blood pressure at home daily, write it down.  Call the office or send message via Mychart with the readings in 2 weeks for Dr. Gardiner Rhyme to review.    Other Instructions Your cardiac CT will be scheduled at one of the below locations:   [facility] [address] [place], [place] [address] [phone]  If scheduled at [facility], please arrive at the [facility] main entrance of  [facility] 30 minutes prior to test start time. Proceed to the [facility] Radiology Department (first floor) to check-in and test prep.  Please follow these instructions carefully (unless otherwise directed):  On the Night Before the Test: . Be sure to Drink plenty of water. . Do not consume any caffeinated/decaffeinated beverages or chocolate 12 hours prior to your test. . Do not take any antihistamines 12 hours prior to your test.  On the Day of the Test: . Drink plenty of water. Do not drink any water within one hour of the test. . Do not eat any food 4 hours prior to the test. . You may take your regular medications prior to the test.  . Take metoprolol (Lopressor) two hours prior to test. . HOLD Losartan AM of CT . FEMALES- please wear underwire-free bra if available      After the Test: . Drink plenty of water. . After receiving IV contrast, you may experience a mild flushed feeling. This is normal. . On occasion, you may experience a mild rash up to 24 hours after the test. This is not dangerous. If this occurs, you can take Benadryl 25 mg and increase your fluid intake. . If you experience trouble breathing, this can be serious. If it is severe call 911 IMMEDIATELY. If it is mild, please call our office. . If you take any of these medications: Glipizide/Metformin, Avandament, Glucavance, please do not take 48 hours after completing test unless otherwise instructed.   Once we have confirmed authorization from your insurance company, we will call you to set  up a date and time for your test. Based on how quickly your insurance processes prior authorizations requests, please allow up to 4 weeks to be contacted for scheduling your Cardiac CT appointment. Be advised that routine Cardiac CT appointments could be scheduled as many as 8 weeks after your provider has ordered it.  For non-scheduling related questions, please contact the cardiac imaging nurse navigator should you  have any questions/concerns: Marchia Bond, Cardiac Imaging Nurse Navigator Burley Saver, Interim Cardiac Imaging Nurse [place] [facility] Vascular Services Direct Office Dial: [phone]   For scheduling needs, including cancellations and rescheduling, please call Vivien Rota at [phone], option 3.

## 2020-06-18 DIAGNOSIS — I350 Nonrheumatic aortic (valve) stenosis: Secondary | ICD-10-CM | POA: Diagnosis not present

## 2020-06-19 LAB — BASIC METABOLIC PANEL
BUN/Creatinine Ratio: 20 (ref 12–28)
BUN: 14 mg/dL (ref 8–27)
CO2: 25 mmol/L (ref 20–29)
Calcium: 9.5 mg/dL (ref 8.7–10.3)
Chloride: 100 mmol/L (ref 96–106)
Creatinine, Ser: 0.7 mg/dL (ref 0.57–1.00)
GFR calc Af Amer: 92 mL/min/{1.73_m2} (ref >59)
GFR calc non Af Amer: 80 mL/min/{1.73_m2} (ref >59)
Glucose: 94 mg/dL (ref 65–99)
Potassium: 4.8 mmol/L (ref 3.5–5.2)
Sodium: 137 mmol/L (ref 134–144)

## 2020-06-29 ENCOUNTER — Telehealth (HOSPITAL_COMMUNITY): Payer: Self-pay | Admitting: Emergency Medicine

## 2020-06-29 NOTE — Telephone Encounter (Signed)
Reaching out to patient to offer assistance regarding upcoming cardiac imaging study; pt verbalizes understanding of appt date/time, parking situation and where to check in, pre-test NPO status and medications ordered, and verified current allergies; name and call back number provided for further questions should they arise Jakara Blatter RN Navigator Cardiac Imaging [facility] [phone] office [phone] cell 

## 2020-06-30 ENCOUNTER — Ambulatory Visit (HOSPITAL_COMMUNITY)
Admission: RE | Admit: 2020-06-30 | Discharge: 2020-06-30 | Disposition: A | Discharge status: Home / Self Care | Payer: Medicare Other | Source: Ambulatory Visit | Attending: Cardiology | Admitting: Cardiology

## 2020-06-30 DIAGNOSIS — I350 Nonrheumatic aortic (valve) stenosis: Secondary | ICD-10-CM

## 2020-06-30 MED ORDER — IOHEXOL 350 MG/ML SOLN
80.0000 mL | Freq: Once | INTRAVENOUS | Status: AC | PRN
  Administered 2020-06-30: 80 mL via INTRAVENOUS

## 2020-07-03 NOTE — Telephone Encounter (Signed)
Spoke to patient and discussed CTA results-appt scheduled 11/16 with Dr. Gardiner Rhyme to discuss

## 2020-07-06 NOTE — Progress Notes (Addendum)
Cardiology Office Note:    Date:  07/07/2020   ID:  Renee Figueroa, DOB 1934/04/15, MRN [id number]  PCP:  Binnie Rail, MD  Cardiologist:  No primary care provider on file.  Electrophysiologist:  None   Referring MD: Binnie Rail, MD   Chief Complaint  Patient presents with  . Aortic Stenosis    History of Present Illness:    Renee Figueroa is a 86 y.o. female with a hx of hypertension, hyperlipidemia who presents for follow-up.  She was referred by Dr. Quay Burow for evaluation of aortic stenosis, initially seen on 06/10/2020.  She denies any chest pain, shortness of breath, lightheadedness, or syncope.  Denies any palpitations or lower extremity edema.  Reports she does not exercise regularly.  Most exertion she does is walking around her house or walking the dog.  Reports BP is usually under good control at home, typically 120s.  No smoking history.  Father died of MI at age 30.  Echocardiogram on 05/20/2020 showed EF 70 to 57%, grade 1 diastolic dysfunction, severe mid cavitary obstruction (peak gradient 90 mmHg), normal RV function, severe aortic stenosis (mean gradient 40 mmHg).  Alternate imaging recommended to better assess aortic stenosis versus high flow state.  CTA was ordered, with aortic valve calcium score 1647, consistent with severe AS.  Coronary calcium score 476.  Proximal to mid LAD heavily calcified, unable to rule out obstructive CAD.  Since last clinic visit, she reports that she has been doing well.  Denies any chest pain, dyspnea, lightheadedness, or syncope.   Past Medical History:  Diagnosis Date  . Allergy    seasonal  . Arthritis    mild  . Basal cell carcinoma of anterior chest    skin  . Colon polyps    adenomatous  . Diverticulosis   . Heart murmur   . Herpes zoster 04/2011   C 6  LUE  . Hyperlipidemia   . Hypertension   . Osteoporosis   . Renal calculi 1962    X 1     Past Surgical History:  Procedure Laterality Date  . colonoscopy with  polypectomy  2004    adenomatous; Dr Henrene Pastor. Neg 2013  . DILATION AND CURETTAGE OF UTERUS     age 33  . G 2 P 2    . TONSILLECTOMY AND ADENOIDECTOMY      Current Medications: Current Meds  Medication Sig  . Calcium Carb-Cholecalciferol (CALCIUM 600+D) 600-800 MG-UNIT TABS Taking daily  . fluticasone (FLONASE) 50 MCG/ACT nasal spray Place into both nostrils daily.  Marland Kitchen losartan (COZAAR) 50 MG tablet Take 1 tablet (50 mg total) by mouth daily.  . rosuvastatin (CRESTOR) 10 MG tablet Take 1 tablet (10 mg total) by mouth daily.  . [DISCONTINUED] losartan (COZAAR) 25 MG tablet Take 1 tablet (25 mg total) by mouth daily.  . [DISCONTINUED] rosuvastatin (CRESTOR) 5 MG tablet Take 1 tablet (5 mg total) by mouth daily.     Allergies:   Patient has no active allergies.   Social History   Socioeconomic History  . Marital status: Married    Spouse name: Not on file  . Number of children: 2  . Years of education: Not on file  . Highest education level: Not on file  Occupational History  . Not on file  Tobacco Use  . Smoking status: Never Smoker  . Smokeless tobacco: Never Used  Vaping Use  . Vaping Use: Never used  Substance and Sexual Activity  .  Alcohol use: Yes    Comment: maybe 1 drink a month  . Drug use: No  . Sexual activity: Not Currently  Other Topics Concern  . Not on file  Social History Narrative  . Not on file   Social Determinants of Health   Financial Resource Strain:   . Difficulty of Paying Living Expenses: Not on file  Food Insecurity:   . Worried About Charity fundraiser in the Last Year: Not on file  . Ran Out of Food in the Last Year: Not on file  Transportation Needs:   . Lack of Transportation (Medical): Not on file  . Lack of Transportation (Non-Medical): Not on file  Physical Activity:   . Days of Exercise per Week: Not on file  . Minutes of Exercise per Session: Not on file  Stress:   . Feeling of Stress : Not on file  Social Connections:   .  Frequency of Communication with Friends and Family: Not on file  . Frequency of Social Gatherings with Friends and Family: Not on file  . Attends Religious Services: Not on file  . Active Member of Clubs or Organizations: Not on file  . Attends Archivist Meetings: Not on file  . Marital Status: Not on file     Family History: The patient's family history includes Heart attack (age of onset: 49) in her father; Hypertension in her mother; Stomach cancer in her mother. There is no history of Colon cancer, Colon polyps, Rectal cancer, Diabetes, or Stroke.  ROS:   Please see the history of present illness.     All other systems reviewed and are negative.  EKGs/Labs/Other Studies Reviewed:    The following studies were reviewed today:   EKG:  EKG is ordered today.  The ekg ordered today demonstrates sinus rhythm, rate 81, nonspecific T wave flattening  Recent Labs: 05/04/2020: ALT 12; Hemoglobin 14.9; Platelets 332; TSH 2.03 06/18/2020: BUN 14; Creatinine, Ser 0.70; Potassium 4.8; Sodium 137  Recent Lipid Panel    Component Value Date/Time   CHOL 266 (H) 05/04/2020 1007   TRIG 111 05/04/2020 1007   HDL 79 05/04/2020 1007   CHOLHDL 3.4 05/04/2020 1007   VLDL 23.4 05/02/2019 0951   LDLCALC 164 (H) 05/04/2020 1007   LDLDIRECT 156.8 10/24/2012 1207    Physical Exam:    VS:  BP (!) 182/90   Pulse 81   Ht 4\' 11"  (1.499 m)   Wt 103 lb 9.6 oz (47 kg)   BMI 20.92 kg/m     Wt Readings from Last 3 Encounters:  07/07/20 103 lb 9.6 oz (47 kg)  06/10/20 104 lb (47.2 kg)  05/04/20 105 lb 6.4 oz (47.8 kg)     GEN:  in no acute distress HEENT: Normal NECK: No JVD CARDIAC: RRR, 3/6 systolic murmur loudest at RUSB, radiates to carotids RESPIRATORY:  Clear to auscultation without rales, wheezing or rhonchi  ABDOMEN: Soft, non-tender, non-distended MUSCULOSKELETAL:  No edema; No deformity  SKIN: Warm and dry NEUROLOGIC:  Alert and oriented x 3 PSYCHIATRIC:  Normal affect     ASSESSMENT:    1. Aortic stenosis, severe   2. Essential hypertension   3. Coronary artery disease involving native coronary artery of native heart without angina pectoris   4. Hyperlipidemia, unspecified hyperlipidemia type    PLAN:    Aortic stenosis: Echo shows Vmax 4.1, MG 42 mmHG, AVA 0.9 cm^2, DI 0.39. Suspect severe AS though high flow state could also be contributing  to elevated gradients on echo, as DI only 0.39.  Concerned that diuretic may be causing dehydration and leading to elevated mid cavitary gradient.  Discontinued HCTZ.  CTA on 06/30/20 showed aortic valve calcium score 1647, consistent with severe AS.  Appears asymptomatic though not very active -Refer to valve clinic for evaluation  CAD: CTA for AV evaluation was done on 06/30/20, suboptimal for coronary evaluation as no nitroglycerin was given.  Coronary calcium score 476.  Proximal to mid LAD heavily calcified, unable to rule out obstructive CAD.  Denies any anginal symptoms.  Will need LHC/RHC prior to aortic valve intervention.  Will increase rosuvastatin to 10 mg daily.  Hypertension: On amlodipine 10 mg daily and losartan 25 mg daily.  BP elevated in clinic today, will increase losartan to 50 mg daily and check BMP in 1 week.  Asked patient to monitor BP daily for next 2 weeks and call with results.  Hyperlipidemia: On rosuvastatin 5 mg twice per week.  LDL 164 on 05/04/20.  Increased rosuvastatin to 10 mg daily.  RTC in 3 months   Medication Adjustments/Labs and Tests Ordered: Current medicines are reviewed at length with the patient today.  Concerns regarding medicines are outlined above.  Orders Placed This Encounter  Procedures  . Basic metabolic panel  . Ambulatory referral to Structural Heart/Valve Clinic (only at [place])  . EKG 12-Lead   Meds ordered this encounter  Medications  . losartan (COZAAR) 50 MG tablet    Sig: Take 1 tablet (50 mg total) by mouth daily.    Dispense:  90 tablet     Refill:  3    Dose increase  . rosuvastatin (CRESTOR) 10 MG tablet    Sig: Take 1 tablet (10 mg total) by mouth daily.    Dispense:  90 tablet    Refill:  3    Dose increase    Patient Instructions  Medication Instructions:  INCREASE Losartan to 50 mg daily INCREASE rosuvastatin (Crestor) to 10 mg daily  *If you need a refill on your cardiac medications before your next appointment, please call your pharmacy*   Lab Work: 1 WEEK (BMET)  If you have labs (blood work) drawn today and your tests are completely normal, you will receive your results only by: Marland Kitchen MyChart Message (if you have MyChart) OR . A paper copy in the mail If you have any lab test that is abnormal or we need to change your treatment, we will call you to review the results.  Follow-Up: At [facility], you and your health needs are our priority.  As part of our continuing mission to provide you with exceptional heart care, we have created designated Provider Care Teams.  These Care Teams include your primary Cardiologist (physician) and Advanced Practice Providers (APPs -  Physician Assistants and Nurse Practitioners) who all work together to provide you with the care you need, when you need it.  We recommend signing up for the patient portal called "MyChart".  Sign up information is provided on this After Visit Summary.  MyChart is used to connect with patients for Virtual Visits (Telemedicine).  Patients are able to view lab/test results, encounter notes, upcoming appointments, etc.  Non-urgent messages can be sent to your provider as well.   To learn more about what you can do with MyChart, go to [email or web address].    Your next appointment:   3 month(s)  The format for your next appointment:   In Person  Provider:   Harrell Gave  Gardiner Rhyme, MD   Other Instructions You have been referred to Dr. Burt Knack (valve clinic)  Please check your blood pressure at home daily, write it down.  Call the office or  send message via Mychart with the readings in 2 weeks for Dr. Gardiner Rhyme to review.        Signed, Donato Heinz, MD  07/07/2020 11:11 PM    [facility]

## 2020-07-07 ENCOUNTER — Ambulatory Visit (INDEPENDENT_AMBULATORY_CARE_PROVIDER_SITE_OTHER): Payer: Medicare Other | Admitting: Cardiology

## 2020-07-07 ENCOUNTER — Other Ambulatory Visit: Payer: Self-pay

## 2020-07-07 ENCOUNTER — Encounter: Payer: Self-pay | Admitting: Cardiology

## 2020-07-07 VITALS — BP 182/90 | HR 81 | Ht 59.0 in | Wt 103.6 lb

## 2020-07-07 DIAGNOSIS — I350 Nonrheumatic aortic (valve) stenosis: Secondary | ICD-10-CM | POA: Diagnosis not present

## 2020-07-07 DIAGNOSIS — E785 Hyperlipidemia, unspecified: Secondary | ICD-10-CM | POA: Diagnosis not present

## 2020-07-07 DIAGNOSIS — I10 Essential (primary) hypertension: Secondary | ICD-10-CM | POA: Diagnosis not present

## 2020-07-07 DIAGNOSIS — I2510 Atherosclerotic heart disease of native coronary artery without angina pectoris: Secondary | ICD-10-CM | POA: Diagnosis not present

## 2020-07-07 MED ORDER — ROSUVASTATIN CALCIUM 10 MG PO TABS
10.0000 mg | ORAL_TABLET | Freq: Every day | ORAL | 3 refills | Status: DC
Start: 2020-07-07 — End: 2021-05-17

## 2020-07-07 MED ORDER — LOSARTAN POTASSIUM 50 MG PO TABS: 50.0000 mg | ORAL_TABLET | Freq: Every day | ORAL | 3 refills | Status: DC

## 2020-07-07 NOTE — Patient Instructions (Signed)
Medication Instructions:  INCREASE Losartan to 50 mg daily INCREASE rosuvastatin (Crestor) to 10 mg daily  *If you need a refill on your cardiac medications before your next appointment, please call your pharmacy*   Lab Work: 1 WEEK (BMET)  If you have labs (blood work) drawn today and your tests are completely normal, you will receive your results only by: Marland Kitchen MyChart Message (if you have MyChart) OR . A paper copy in the mail If you have any lab test that is abnormal or we need to change your treatment, we will call you to review the results.  Follow-Up: At [facility], you and your health needs are our priority.  As part of our continuing mission to provide you with exceptional heart care, we have created designated Provider Care Teams.  These Care Teams include your primary Cardiologist (physician) and Advanced Practice Providers (APPs -  Physician Assistants and Nurse Practitioners) who all work together to provide you with the care you need, when you need it.  We recommend signing up for the patient portal called "MyChart".  Sign up information is provided on this After Visit Summary.  MyChart is used to connect with patients for Virtual Visits (Telemedicine).  Patients are able to view lab/test results, encounter notes, upcoming appointments, etc.  Non-urgent messages can be sent to your provider as well.   To learn more about what you can do with MyChart, go to [email or web address].    Your next appointment:   3 month(s)  The format for your next appointment:   In Person  Provider:   Oswaldo Milian, MD   Other Instructions You have been referred to Dr. Burt Knack (valve clinic)  Please check your blood pressure at home daily, write it down.  Call the office or send message via Mychart with the readings in 2 weeks for Dr. Gardiner Rhyme to review.

## 2020-07-14 DIAGNOSIS — I10 Essential (primary) hypertension: Secondary | ICD-10-CM | POA: Diagnosis not present

## 2020-07-14 LAB — BASIC METABOLIC PANEL
BUN/Creatinine Ratio: 14 (ref 12–28)
BUN: 9 mg/dL (ref 8–27)
CO2: 24 mmol/L (ref 20–29)
Calcium: 9.5 mg/dL (ref 8.7–10.3)
Chloride: 103 mmol/L (ref 96–106)
Creatinine, Ser: 0.63 mg/dL (ref 0.57–1.00)
GFR calc Af Amer: 95 mL/min/{1.73_m2} (ref >59)
GFR calc non Af Amer: 83 mL/min/{1.73_m2} (ref >59)
Glucose: 88 mg/dL (ref 65–99)
Potassium: 4.3 mmol/L (ref 3.5–5.2)
Sodium: 139 mmol/L (ref 134–144)

## 2020-07-22 ENCOUNTER — Ambulatory Visit: Payer: Medicare Other | Admitting: Cardiovascular Disease

## 2020-07-22 ENCOUNTER — Other Ambulatory Visit: Payer: Self-pay

## 2020-07-22 ENCOUNTER — Encounter: Payer: Self-pay | Admitting: Cardiovascular Disease

## 2020-07-22 VITALS — BP 160/98 | HR 84 | Ht 59.0 in | Wt 102.0 lb

## 2020-07-22 DIAGNOSIS — I350 Nonrheumatic aortic (valve) stenosis: Secondary | ICD-10-CM | POA: Diagnosis not present

## 2020-07-22 NOTE — Progress Notes (Signed)
Cardiology Office Note:    Date:  07/22/2020   ID:  Renee Figueroa, DOB 29-Dec-1933, MRN [id number]  PCP:  Binnie Rail, MD  [facility] Cardiologist:  No primary care provider on file.  [facility] Electrophysiologist:  None   Referring MD: Binnie Rail, MD   Chief Complaint  Patient presents with  . Aortic Stenosis    History of Present Illness:    Renee Figueroa is a 86 y.o. female referred by Dr Gardiner Rhyme for evaluation of severe aortic stenosis.   The patient is here with her husband today. She states that she was 'born with a heart murmur' and has known of her murmur throughout her adult life. Dr Quay Burow has noticed that the patient's heart murmur has intensified but the patient had delayed an echo because of her husband's health. She finally agreed to an echo this year and was found to have severe aortic stenosis. 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 They have 2 grown children, a son in [place] and a daughter in [place], [place].   She reports being very active maintaining her house, volunteering at her [employer], and walking for exercise. She used to speed walk but stopped doing this because of osteoporosis and concern about falling. The patient reports no symptoms of dyspnea, chest pain or pressure, lightheadedness, or syncope. She states that she sometimes feels tired but thinks that this is normal for her age. She has never been hospitalized. She reports no problems with her teeth or gums and has regular dental care.   Past Medical History:  Diagnosis Date  . Allergy    seasonal  . Arthritis    mild  . Basal cell carcinoma of anterior chest    skin  . Colon polyps    adenomatous  . Diverticulosis   . Heart murmur   . Herpes zoster 04/2011   C 6  LUE  . Hyperlipidemia   . Hypertension   . Osteoporosis   . Renal calculi 1962    X 1     Past Surgical History:  Procedure Laterality Date  . colonoscopy with  polypectomy  2004    adenomatous; Dr Henrene Pastor. Neg 2013  . DILATION AND CURETTAGE OF UTERUS     age 29  . G 2 P 2    . TONSILLECTOMY AND ADENOIDECTOMY      Current Medications: Current Meds  Medication Sig  . Calcium Carb-Cholecalciferol (CALCIUM 600+D) 600-800 MG-UNIT TABS Taking daily  . Cholecalciferol (VITAMIN D) 50 MCG (2000 UT) CAPS Take by mouth daily.  . fluticasone (FLONASE) 50 MCG/ACT nasal spray Place into both nostrils daily.  Marland Kitchen losartan (COZAAR) 50 MG tablet Take 1 tablet (50 mg total) by mouth daily.  . rosuvastatin (CRESTOR) 10 MG tablet Take 1 tablet (10 mg total) by mouth daily.     Allergies:   Patient has no known allergies.   Social History   Socioeconomic History  . Marital status: Married    Spouse name: Not on file  . Number of children: 2  . Years of education: Not on file  . Highest education level: Not on file  Occupational History  . Not on file  Tobacco Use  . Smoking status: Never Smoker  . Smokeless tobacco: Never Used  Vaping Use  . Vaping Use: Never used  Substance and Sexual Activity  . Alcohol use: Yes    Comment: maybe 1 drink a month  .  Drug use: No  . Sexual activity: Not Currently  Other Topics Concern  . Not on file  Social History Narrative  . Not on file   Social Determinants of Health   Financial Resource Strain:   . Difficulty of Paying Living Expenses: Not on file  Food Insecurity:   . Worried About Charity fundraiser in the Last Year: Not on file  . Ran Out of Food in the Last Year: Not on file  Transportation Needs:   . Lack of Transportation (Medical): Not on file  . Lack of Transportation (Non-Medical): Not on file  Physical Activity:   . Days of Exercise per Week: Not on file  . Minutes of Exercise per Session: Not on file  Stress:   . Feeling of Stress : Not on file  Social Connections:   . Frequency of Communication with Friends and Family: Not on file  . Frequency of Social Gatherings with Friends and  Family: Not on file  . Attends Religious Services: Not on file  . Active Member of Clubs or Organizations: Not on file  . Attends Archivist Meetings: Not on file  . Marital Status: Not on file     Family History: The patient's family history includes Heart attack (age of onset: 37) in her father; Hypertension in her mother; Stomach cancer in her mother. There is no history of Colon cancer, Colon polyps, Rectal cancer, Diabetes, or Stroke.  ROS:   Please see the history of present illness.    All other systems reviewed and are negative.  EKGs/Labs/Other Studies Reviewed:    The following studies were reviewed today: Cardiac CTA: FINDINGS: Aortic Root:  Aortic valve:trileaflet  Aortic valve calcium score: 1647  Aortic annulus:  Diameter: 74mm x 61mm  Perimeter: 37mm  Area: 325 cm^2  Calcifications: Mild calcification adjacent to left coronary cusp  Coronary height: Min Left - 75mm, Max Left - 87mm; Min Right - 11mm  Sinotubular height: Left cusp - 63mm; Right cusp - 60mm; Noncoronary cusp - 18mm  LVOT (as measured 3 mm below the annulus):  Diameter: 68mm x 39mm  Area: 318 mm^2  Calcifications: No calcifications  Aortic sinus width: Left cusp - 32mm; Right cusp - 35mm; Noncoronary cusp - 59mm  Sinotubular junction width: 34mm x 67mm  Optimum Fluoroscopic Angle for Delivery: LAO 27 CAU 3  Cardiac:  Right atrium: Normal size  Right ventricle: Normal size  Pulmonary arteries: Normal size  Pulmonary veins: Normal configuration  Left atrium: Mild enlargement  Left ventricle: Normal size  Pericardium: Normal thickness  Coronary arteries: Calcium score 476. Proximal to mid LAD heavily calcified, unable to rule out obstructive CAD  IMPRESSION: 1. Trileaflet aortic valve with severe calcifications (calcium score 1647)  2. Aortic annulus measures 39mm x 75mm with area 325 mm^2 and perimeter 32mm. Mild annular  calcification adjacent to left coronary cusp. Annular measurements suitable for delivery or 1mm Evolut valve.  3. Sufficient coronary to annulus distance, measuring 58mm to left main and 94mm to RCA  4. Optimum Fluoroscopic Angle for Delivery:  LAO 27 CAU 3  5. Coronary calcium score 476 (74th percentile). Proximal to mid LAD heavily calcified, unable to rule out obstructive CAD  Echo: IMPRESSIONS    1. Left ventricular ejection fraction, by estimation, is 70 to 75%. The  left ventricle has hyperdynamic function. The left ventricle has no  regional wall motion abnormalities. There is mild left ventricular  hypertrophy. Left ventricular diastolic  parameters are consistent  with Grade I diastolic dysfunction (impaired  relaxation).  2. Severe mid-cavitary obstruction, mid-cavitary gradient in the left  ventricle with maximum instantaneous gradient 90 mmHg, Vmax 4.7 m/s.  3. Right ventricular systolic function is normal. The right ventricular  size is normal. Tricuspid regurgitation signal is inadequate for assessing  PA pressure.  4. The mitral valve is normal in structure. Trivial mitral valve  regurgitation. No evidence of mitral stenosis.  5. The aortic valve was not well visualized. There is moderate  calcification of the aortic valve. Aortic valve regurgitation is trivial.  Severe aortic valve stenosis. Aortic valve mean gradient measures 40.0  mmHg.  6. The inferior vena cava is normal in size with greater than 50%  respiratory variability, suggesting right atrial pressure of 3 mmHg.  7. Increased flow velocities may be secondary to anemia, thyrotoxicosis,  hyperdynamic or high flow state.   Conclusion(s)/Recommendation(s): Consider alternate imaging to better  assess aortic valve structure and function in setting of hyperdynamic flow  and possible severe aortic stenosis vs. high flow state.   FINDINGS  Left Ventricle: Left ventricular ejection fraction,  by estimation, is 70  to 75%. The left ventricle has hyperdynamic function. The left ventricle  has no regional wall motion abnormalities. The left ventricular internal  cavity size was normal in size.  There is mild left ventricular hypertrophy. Left ventricular diastolic  parameters are consistent with Grade I diastolic dysfunction (impaired  relaxation).   Right Ventricle: The right ventricular size is normal. No increase in  right ventricular wall thickness. Right ventricular systolic function is  normal. Tricuspid regurgitation signal is inadequate for assessing PA  pressure.   Left Atrium: Left atrial size was normal in size.   Right Atrium: Right atrial size was normal in size.   Pericardium: There is no evidence of pericardial effusion.   Mitral Valve: The mitral valve is normal in structure. Mild mitral annular  calcification. Trivial mitral valve regurgitation. No evidence of mitral  valve stenosis.   Tricuspid Valve: The tricuspid valve is normal in structure. Tricuspid  valve regurgitation is not demonstrated. No evidence of tricuspid  stenosis.   Aortic Valve: The aortic valve was not well visualized. There is moderate  calcification of the aortic valve. Aortic valve regurgitation is trivial.  Severe aortic stenosis is present. Aortic valve mean gradient measures  40.0 mmHg. Aortic valve peak  gradient measures 60.8 mmHg. Aortic valve area, by VTI measures 0.90 cm.   Pulmonic Valve: The pulmonic valve was not well visualized. Pulmonic valve  regurgitation is trivial. No evidence of pulmonic stenosis.   Aorta: The aortic root is normal in size and structure.   Venous: The inferior vena cava is normal in size with greater than 50%  respiratory variability, suggesting right atrial pressure of 3 mmHg.   IAS/Shunts: No atrial level shunt detected by color flow Doppler.     LEFT VENTRICLE  PLAX 2D  LVIDd:     2.30 cm Diastology  LVIDs:     1.30 cm LV  e' medial:  4.90 cm/s  LV PW:     1.10 cm LV E/e' medial: 14.5  LV IVS:    1.20 cm LV e' lateral:  5.77 cm/s  LVOT diam:   1.70 cm LV E/e' lateral: 12.3  LV SV:     63  LV SV Index:  45  LVOT Area:   2.27 cm     RIGHT VENTRICLE  RV Basal diam: 1.70 cm  RV S prime:  20.80 cm/s  TAPSE (M-mode): 1.9 cm   LEFT ATRIUM       Index    RIGHT ATRIUM     Index  LA diam:    3.10 cm 2.21 cm/m RA Area:   6.95 cm  LA Vol (A2C):  37.6 ml 26.77 ml/m RA Volume:  11.80 ml 8.40 ml/m  LA Vol (A4C):  23.2 ml 16.51 ml/m  LA Biplane Vol: 30.3 ml 21.57 ml/m  AORTIC VALVE  AV Area (Vmax):  0.84 cm  AV Area (Vmean):  0.88 cm  AV Area (VTI):   0.90 cm  AV Vmax:      390.00 cm/s  AV Vmean:     277.500 cm/s  AV VTI:      0.702 m  AV Peak Grad:   60.8 mmHg  AV Mean Grad:   40.0 mmHg  LVOT Vmax:     144.00 cm/s  LVOT Vmean:    107.000 cm/s  LVOT VTI:     0.278 m  LVOT/AV VTI ratio: 0.40    AORTA  Ao Root diam: 2.40 cm   MITRAL VALVE  MV Area (PHT): 2.45 cm   SHUNTS  MV Decel Time: 310 msec   Systemic VTI: 0.28 m  MV E velocity: 71.00 cm/s  Systemic Diam: 1.70 cm  MV A velocity: 149.00 cm/s  MV E/A ratio: 0.48   EKG:  EKG is not ordered today.   Recent Labs: 05/04/2020: ALT 12; Hemoglobin 14.9; Platelets 332; TSH 2.03 07/14/2020: BUN 9; Creatinine, Ser 0.63; Potassium 4.3; Sodium 139  Recent Lipid Panel    Component Value Date/Time   CHOL 266 (H) 05/04/2020 1007   TRIG 111 05/04/2020 1007   HDL 79 05/04/2020 1007   CHOLHDL 3.4 05/04/2020 1007   VLDL 23.4 05/02/2019 0951   LDLCALC 164 (H) 05/04/2020 1007   LDLDIRECT 156.8 10/24/2012 1207     Risk Assessment/Calculations:       Physical Exam:    VS:  BP (!) 160/98   Pulse 84   Ht 4\' 11"  (1.499 m)   Wt 102 lb (46.3 kg)   BMI 20.60 kg/m     Wt Readings from Last 3 Encounters:  07/22/20 102 lb (46.3 kg)  07/07/20 103  lb 9.6 oz (47 kg)  06/10/20 104 lb (47.2 kg)     GEN:  Well nourished, well developed in no acute distress HEENT: Normal NECK: No JVD; No carotid bruits LYMPHATICS: No lymphadenopathy CARDIAC: RRR, 3/6 harsh late peaking crescendo decrescendo murmur at the right upper sternal border, no diastolic murmur, absent A2 RESPIRATORY:  Clear to auscultation without rales, wheezing or rhonchi  ABDOMEN: Soft, non-tender, non-distended MUSCULOSKELETAL:  No edema; No deformity  SKIN: Warm and dry NEUROLOGIC:  Alert and oriented x 3 PSYCHIATRIC:  Normal affect   ASSESSMENT:    1. Aortic stenosis, severe    PLAN:    In order of problems listed above:  46. 86 year old healthy woman who has severe, stage C1 aortic stenosis (asymptomatic).  Her echocardiogram is personally reviewed and demonstrates vigorous LV systolic function with LVEF greater than 70%.  She has mid cavitary obliteration and elevated mid cavitary velocities.  The patient has a poorly visualized aortic valve by echo but the valve does appear calcified and restricted.  Peak and mean transaortic gradients are 68 and 42 mmHg, respectively.  The dimensionless index is 0.4 and a calculated aortic valve area 0.9 cm.  Coronary CTA confirms heavy calcification of the aortic valve with an  aortic valve calcium score of 1647.  Her exam is also consistent with severe aortic stenosis with a late peaking murmur and absent A2.  We had a lengthy discussion today about the natural history of severe aortic stenosis.  We discussed cardinal symptoms and I asked her to keep a close eye out for the development of shortness of breath, progressive fatigue, chest discomfort, or lightheadedness/syncope.  We discussed potential treatment options of surgical aortic valve replacement, TAVR, and medical therapy/surveillance.  Considering her advanced age, we would likely treat her with TAVR when the time comes for intervention.  She understands that her aortic stenosis  will likely progress and may require intervention for development of symptoms or progression into any high risk characteristics of a severely elevated gradient or changes in LV function.  We talked about the idea of earlier intervention with TAVR versus close surveillance and we both favor surveillance at this point.  I am going to check a BNP just to be sure that there is no enzymatic evidence of heart failure/myocardial strain.  As long as her BNP is in the near normal range, I would anticipate close clinical follow-up.  She is scheduled to see Dr. Gardiner Rhyme in 3 months and I think she should have a repeat echocardiogram in about 6 months.  I will plan to see her back at the time of her repeat echo and we can discuss any significant changes.    Shared Decision Making/Informed Consent        Medication Adjustments/Labs and Tests Ordered: Current medicines are reviewed at length with the patient today.  Concerns regarding medicines are outlined above.  Orders Placed This Encounter  Procedures  . Pro b natriuretic peptide (BNP)  . ECHOCARDIOGRAM COMPLETE   No orders of the defined types were placed in this encounter.   Patient Instructions  Medication Instructions:  Your provider recommends that you continue on your current medications as directed. Please refer to the Current Medication list given to you today.   *If you need a refill on your cardiac medications before your next appointment, please call your pharmacy*  Lab Work: TODAY! BNP If you have labs (blood work) drawn today and your tests are completely normal, you will receive your results only by: Marland Kitchen MyChart Message (if you have MyChart) OR . A paper copy in the mail If you have any lab test that is abnormal or we need to change your treatment, we will call you to review the results.  Follow-Up: You will have a 6 month echo and visit with Dr. Burt Knack.    Signed, Sherren Mocha, MD  07/22/2020 5:31 PM    [place]

## 2020-07-22 NOTE — Patient Instructions (Addendum)
Medication Instructions:  Your provider recommends that you continue on your current medications as directed. Please refer to the Current Medication list given to you today.   *If you need a refill on your cardiac medications before your next appointment, please call your pharmacy*  Lab Work: TODAY! BNP If you have labs (blood work) drawn today and your tests are completely normal, you will receive your results only by: Marland Kitchen MyChart Message (if you have MyChart) OR . A paper copy in the mail If you have any lab test that is abnormal or we need to change your treatment, we will call you to review the results.  Follow-Up: You will have a 6 month echo and visit with Dr. Burt Knack.

## 2020-07-23 LAB — PRO B NATRIURETIC PEPTIDE: NT-Pro BNP: 1512 pg/mL — ABNORMAL HIGH (ref 0–738)

## 2020-08-13 ENCOUNTER — Telehealth: Payer: Self-pay

## 2020-08-13 DIAGNOSIS — I350 Nonrheumatic aortic (valve) stenosis: Secondary | ICD-10-CM

## 2020-08-13 NOTE — Telephone Encounter (Signed)
Spoke with the patient in detail about catheterization and scheduling. She requests to have heart catheterization 08/26/20. Discussed instructions with patient. Detailed instructions sent via MyChart message and she will reply once reviewed. She was grateful for call and agrees with plan.

## 2020-08-17 ENCOUNTER — Telehealth: Payer: Self-pay | Admitting: Internal Medicine

## 2020-08-17 NOTE — Telephone Encounter (Signed)
Copied from [place] [id number]. Topic: Medicare AWV >> Aug 17, 2020  2:59 PM Cher Nakai R wrote: Reason for CRM:  No answer unable to leave message for patient to call back and schedule Medicare Annual Wellness Visit (AWV) in office.   If not able to come in office, please offer to do virtually.   45 minute appointment  Last AWV 02/14/2018  Please schedule at anytime with [facility] Health Advisor.

## 2020-08-24 ENCOUNTER — Other Ambulatory Visit: Payer: Medicare Other

## 2020-08-24 ENCOUNTER — Other Ambulatory Visit (HOSPITAL_COMMUNITY)
Admission: RE | Admit: 2020-08-24 | Discharge: 2020-08-24 | Disposition: A | Discharge status: Home / Self Care | Payer: Medicare Other | Source: Ambulatory Visit | Attending: Cardiovascular Disease | Admitting: Cardiovascular Disease

## 2020-08-24 ENCOUNTER — Other Ambulatory Visit: Payer: Self-pay

## 2020-08-24 DIAGNOSIS — Z01812 Encounter for preprocedural laboratory examination: Secondary | ICD-10-CM | POA: Diagnosis not present

## 2020-08-24 DIAGNOSIS — I350 Nonrheumatic aortic (valve) stenosis: Secondary | ICD-10-CM | POA: Diagnosis not present

## 2020-08-24 DIAGNOSIS — Z20822 Contact with and (suspected) exposure to covid-19: Secondary | ICD-10-CM | POA: Diagnosis not present

## 2020-08-24 LAB — CBC WITH DIFFERENTIAL/PLATELET
Basophils Absolute: 0 10*3/uL (ref 0.0–0.2)
Basos: 0 %
EOS (ABSOLUTE): 0.2 10*3/uL (ref 0.0–0.4)
Eos: 2 %
Hematocrit: 41 % (ref 34.0–46.6)
Hemoglobin: 13.9 g/dL (ref 11.1–15.9)
Lymphocytes Absolute: 3.1 10*3/uL (ref 0.7–3.1)
Lymphs: 38 %
MCH: 30.3 pg (ref 26.6–33.0)
MCHC: 33.9 g/dL (ref 31.5–35.7)
MCV: 90 fL (ref 79–97)
Monocytes Absolute: 0.7 10*3/uL (ref 0.1–0.9)
Monocytes: 8 %
Neutrophils Absolute: 4.2 10*3/uL (ref 1.4–7.0)
Neutrophils: 52 %
Platelets: 226 10*3/uL (ref 150–450)
RBC: 4.58 x10E6/uL (ref 3.77–5.28)
RDW: 12.5 % (ref 11.7–15.4)
WBC: 8.2 10*3/uL (ref 3.4–10.8)

## 2020-08-24 LAB — BASIC METABOLIC PANEL
BUN/Creatinine Ratio: 16 (ref 12–28)
BUN: 10 mg/dL (ref 8–27)
CO2: 28 mmol/L (ref 20–29)
Calcium: 9.7 mg/dL (ref 8.7–10.3)
Chloride: 100 mmol/L (ref 96–106)
Creatinine, Ser: 0.62 mg/dL (ref 0.57–1.00)
GFR calc Af Amer: 96 mL/min/{1.73_m2} (ref >59)
GFR calc non Af Amer: 83 mL/min/{1.73_m2} (ref >59)
Glucose: 94 mg/dL (ref 65–99)
Potassium: 4.2 mmol/L (ref 3.5–5.2)
Sodium: 137 mmol/L (ref 134–144)

## 2020-08-25 ENCOUNTER — Telehealth: Payer: Self-pay | Admitting: *Deleted

## 2020-08-25 LAB — SARS CORONAVIRUS 2 (TAT 6-24 HRS): SARS Coronavirus 2: NEGATIVE

## 2020-08-25 NOTE — Telephone Encounter (Addendum)
Pt contacted pre-catheterization scheduled at [facility] for: Wednesday August 26, 2020 1:30 PM Verified arrival time and place: [facility] Main Entrance [facility]) at: 11:30 AM   No solid food after midnight prior to cath, clear liquids until 5 AM day of procedure.   AM meds can be  taken pre-cath with sips of water including: ASA 81 mg   Confirmed patient has responsible adult to drive home post procedure and be with patient first 24 hours after arriving home: yes  You are allowed ONE visitor in the waiting room during the time you are at the hospital for your procedure. Both you and your visitor must wear a mask once you enter the hospital.       COVID-19 Pre-Screening Questions:  . In the past 14 days have you had any symptoms concerning for COVID-19 infection (fever, chills, cough, or new shortness of breath)? no . In the past 14 days have you been around anyone with known Covid 19? no    Reviewed procedure/mask/visitor instructions, COVID-19 questions reviewed with patient.

## 2020-08-26 ENCOUNTER — Ambulatory Visit (HOSPITAL_COMMUNITY)
Admission: RE | Admit: 2020-08-26 | Discharge: 2020-08-26 | Disposition: A | Discharge status: Home / Self Care | Payer: Medicare Other | Source: Ambulatory Visit | Attending: Cardiovascular Disease | Admitting: Cardiovascular Disease

## 2020-08-26 ENCOUNTER — Encounter (HOSPITAL_COMMUNITY)
Admission: RE | Disposition: A | Discharge status: Home / Self Care | Payer: Medicare Other | Source: Ambulatory Visit | Attending: Cardiovascular Disease

## 2020-08-26 DIAGNOSIS — I350 Nonrheumatic aortic (valve) stenosis: Secondary | ICD-10-CM | POA: Diagnosis not present

## 2020-08-26 DIAGNOSIS — I2510 Atherosclerotic heart disease of native coronary artery without angina pectoris: Secondary | ICD-10-CM | POA: Diagnosis not present

## 2020-08-26 DIAGNOSIS — Z79899 Other long term (current) drug therapy: Secondary | ICD-10-CM | POA: Insufficient documentation

## 2020-08-26 HISTORY — PX: RIGHT/LEFT HEART CATH AND CORONARY ANGIOGRAPHY: CATH118266

## 2020-08-26 LAB — POCT I-STAT 7, (LYTES, BLD GAS, ICA,H+H)
Acid-Base Excess: 0 mmol/L (ref 0.0–2.0)
Bicarbonate: 25.3 mmol/L (ref 20.0–28.0)
Calcium, Ion: 1.21 mmol/L (ref 1.15–1.40)
HCT: 39 % (ref 36.0–46.0)
Hemoglobin: 13.3 g/dL (ref 12.0–15.0)
O2 Saturation: 99 %
Potassium: 3.7 mmol/L (ref 3.5–5.1)
Sodium: 140 mmol/L (ref 135–145)
TCO2: 26 mmol/L (ref 22–32)
pCO2 arterial: 40.4 mmHg (ref 32.0–48.0)
pH, Arterial: 7.404 (ref 7.350–7.450)
pO2, Arterial: 141 mmHg — ABNORMAL HIGH (ref 83.0–108.0)

## 2020-08-26 LAB — POCT I-STAT EG7
Acid-Base Excess: 0 mmol/L (ref 0.0–2.0)
Bicarbonate: 25.4 mmol/L (ref 20.0–28.0)
Calcium, Ion: 1.17 mmol/L (ref 1.15–1.40)
HCT: 38 % (ref 36.0–46.0)
Hemoglobin: 12.9 g/dL (ref 12.0–15.0)
O2 Saturation: 75 %
Potassium: 3.5 mmol/L (ref 3.5–5.1)
Sodium: 142 mmol/L (ref 135–145)
TCO2: 27 mmol/L (ref 22–32)
pCO2, Ven: 45.1 mmHg (ref 44.0–60.0)
pH, Ven: 7.358 (ref 7.250–7.430)
pO2, Ven: 42 mmHg (ref 32.0–45.0)

## 2020-08-26 SURGERY — RIGHT/LEFT HEART CATH AND CORONARY ANGIOGRAPHY
Anesthesia: LOCAL

## 2020-08-26 MED ORDER — VERAPAMIL HCL 2.5 MG/ML IV SOLN
INTRAVENOUS | Status: DC | PRN
  Administered 2020-08-26: 10 mL via INTRA_ARTERIAL

## 2020-08-26 MED ORDER — SODIUM CHLORIDE 0.9% FLUSH: 3.0000 mL | Freq: Two times a day (BID) | INTRAVENOUS | Status: DC

## 2020-08-26 MED ORDER — FENTANYL CITRATE (PF) 100 MCG/2ML IJ SOLN
INTRAMUSCULAR | Status: AC
  Filled 2020-08-26: qty 2

## 2020-08-26 MED ORDER — HEPARIN (PORCINE) IN NACL 1000-0.9 UT/500ML-% IV SOLN
INTRAVENOUS | Status: DC | PRN
  Administered 2020-08-26 (×2): 500 mL

## 2020-08-26 MED ORDER — LIDOCAINE HCL (PF) 1 % IJ SOLN
INTRAMUSCULAR | Status: AC
  Filled 2020-08-26: qty 30

## 2020-08-26 MED ORDER — SODIUM CHLORIDE 0.9% FLUSH: 3.0000 mL | INTRAVENOUS | Status: DC | PRN

## 2020-08-26 MED ORDER — SODIUM CHLORIDE 0.9 % IV SOLN: 250.0000 mL | INTRAVENOUS | Status: DC | PRN

## 2020-08-26 MED ORDER — SODIUM CHLORIDE 0.9 % WEIGHT BASED INFUSION
3.0000 mL/kg/h | INTRAVENOUS | Status: AC
  Administered 2020-08-26: 3 mL/kg/h via INTRAVENOUS

## 2020-08-26 MED ORDER — SODIUM CHLORIDE 0.9 % WEIGHT BASED INFUSION: 1.0000 mL/kg/h | INTRAVENOUS | Status: DC

## 2020-08-26 MED ORDER — ASPIRIN EC 81 MG PO TBEC: 81.0000 mg | DELAYED_RELEASE_TABLET | Freq: Every day | ORAL | 2 refills | Status: AC

## 2020-08-26 MED ORDER — ACETAMINOPHEN 325 MG PO TABS: 650.0000 mg | ORAL_TABLET | ORAL | Status: DC | PRN

## 2020-08-26 MED ORDER — HEPARIN SODIUM (PORCINE) 1000 UNIT/ML IJ SOLN
INTRAMUSCULAR | Status: DC | PRN
  Administered 2020-08-26: 2500 [IU] via INTRAVENOUS

## 2020-08-26 MED ORDER — MIDAZOLAM HCL 2 MG/2ML IJ SOLN
INTRAMUSCULAR | Status: DC | PRN
  Administered 2020-08-26: 2 mg via INTRAVENOUS

## 2020-08-26 MED ORDER — LIDOCAINE HCL (PF) 1 % IJ SOLN
INTRAMUSCULAR | Status: DC | PRN
  Administered 2020-08-26: 3 mL
  Administered 2020-08-26: 2 mL

## 2020-08-26 MED ORDER — SODIUM CHLORIDE 0.9 % IV SOLN
250.0000 mL | INTRAVENOUS | Status: DC | PRN
Start: 2020-08-27 — End: 2020-08-27

## 2020-08-26 MED ORDER — VERAPAMIL HCL 2.5 MG/ML IV SOLN
INTRAVENOUS | Status: AC
  Filled 2020-08-26: qty 2

## 2020-08-26 MED ORDER — MIDAZOLAM HCL 2 MG/2ML IJ SOLN
INTRAMUSCULAR | Status: AC
  Filled 2020-08-26: qty 2

## 2020-08-26 MED ORDER — HYDRALAZINE HCL 20 MG/ML IJ SOLN: 10.0000 mg | INTRAMUSCULAR | Status: DC | PRN

## 2020-08-26 MED ORDER — ONDANSETRON HCL 4 MG/2ML IJ SOLN: 4.0000 mg | Freq: Four times a day (QID) | INTRAMUSCULAR | Status: DC | PRN

## 2020-08-26 MED ORDER — LABETALOL HCL 5 MG/ML IV SOLN: 10.0000 mg | INTRAVENOUS | Status: DC | PRN

## 2020-08-26 MED ORDER — HEPARIN SODIUM (PORCINE) 1000 UNIT/ML IJ SOLN
INTRAMUSCULAR | Status: AC
  Filled 2020-08-26: qty 1

## 2020-08-26 MED ORDER — IOHEXOL 350 MG/ML SOLN
INTRAVENOUS | Status: DC | PRN
  Administered 2020-08-26: 55 mL

## 2020-08-26 MED ORDER — HEPARIN (PORCINE) IN NACL 1000-0.9 UT/500ML-% IV SOLN
INTRAVENOUS | Status: AC
  Filled 2020-08-26: qty 1000

## 2020-08-26 MED ORDER — FENTANYL CITRATE (PF) 100 MCG/2ML IJ SOLN
INTRAMUSCULAR | Status: DC | PRN
  Administered 2020-08-26: 25 ug via INTRAVENOUS

## 2020-08-26 MED ORDER — ASPIRIN 81 MG PO CHEW: 81.0000 mg | CHEWABLE_TABLET | ORAL | Status: DC

## 2020-08-26 SURGICAL SUPPLY — 14 items
CATH 5FR JL3.5 JR4 ANG PIG MP (CATHETERS) ×1 IMPLANT
CATH BALLN WEDGE 5F 110CM (CATHETERS) ×1 IMPLANT
CATH INFINITI 5FR AL1 (CATHETERS) ×1 IMPLANT
DEVICE RAD TR BAND REGULAR (VASCULAR PRODUCTS) ×1 IMPLANT
GLIDESHEATH SLEND SS 6F .021 (SHEATH) ×1 IMPLANT
GUIDEWIRE INQWIRE 1.5J.035X260 (WIRE) IMPLANT
INQWIRE 1.5J .035X260CM (WIRE) ×2
KIT HEART LEFT (KITS) ×2 IMPLANT
PACK CARDIAC CATHETERIZATION (CUSTOM PROCEDURE TRAY) ×2 IMPLANT
SHEATH GLIDE SLENDER 4/5FR (SHEATH) ×1 IMPLANT
TRANSDUCER W/STOPCOCK (MISCELLANEOUS) ×2 IMPLANT
TUBING CIL FLEX 10 FLL-RA (TUBING) ×2 IMPLANT
WIRE EMERALD ST .035X260CM (WIRE) ×1 IMPLANT
WIRE HI TORQ VERSACORE J 260CM (WIRE) ×1 IMPLANT

## 2020-08-26 NOTE — Discharge Instructions (Signed)

## 2020-08-26 NOTE — Research (Signed)
[facility] Informed Consent   Subject Name: Renee Figueroa  Subject met inclusion and exclusion criteria.  The informed consent form, study requirements and expectations were reviewed with the subject and questions and concerns were addressed prior to the signing of the consent form.  The subject verbalized understanding of the trail requirements.  The subject agreed to participate in the [facility] trial and signed the informed consent.  The informed consent was obtained prior to performance of any protocol-specific procedures for the subject.  A copy of the signed informed consent was given to the subject and a copy was placed in the subject's medical record.  Mena Goes. 08/26/2020, 11:15 am

## 2020-08-26 NOTE — H&P (Signed)
Expand AllCollapse All    Cardiology Office Note:    Date:  07/22/2020   ID:  Renee Figueroa, DOB 01/08/1934, MRN [id number]  PCP:  Binnie Rail, MD       [facility] Cardiologist:  No primary care provider on file.  [facility] Electrophysiologist:  None   Referring MD: Binnie Rail, MD      Chief Complaint  Patient presents with  . Aortic Stenosis    History of Present Illness:    Renee Figueroa is a 87 y.o. female referred by Dr Gardiner Rhyme for evaluation of severe aortic stenosis.   The patient is here with her husband today. She states that she was 'born with a heart murmur' and has known of her murmur throughout her adult life. Dr Quay Burow has noticed that the patient's heart murmur has intensified but the patient had delayed an echo because of her husband's health. She finally agreed to an echo this year and was found to have severe aortic stenosis. 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 They have 2 grown children, a son in [place] and a daughter in [place], [place].   She reports being very active maintaining her house, volunteering at her [employer], and walking for exercise. She used to speed walk but stopped doing this because of osteoporosis and concern about falling. The patient reports no symptoms of dyspnea, chest pain or pressure, lightheadedness, or syncope. She states that she sometimes feels tired but thinks that this is normal for her age. She has never been hospitalized. She reports no problems with her teeth or gums and has regular dental care.       Past Medical History:  Diagnosis Date  . Allergy    seasonal  . Arthritis    mild  . Basal cell carcinoma of anterior chest    skin  . Colon polyps    adenomatous  . Diverticulosis   . Heart murmur   . Herpes zoster 04/2011   C 6  LUE  . Hyperlipidemia   . Hypertension   . Osteoporosis   . Renal calculi 1962    X 1          Past  Surgical History:  Procedure Laterality Date  . colonoscopy with polypectomy  2004    adenomatous; Dr Henrene Pastor. Neg 2013  . DILATION AND CURETTAGE OF UTERUS     age 84  . G 2 P 2    . TONSILLECTOMY AND ADENOIDECTOMY      Current Medications: Active Medications      Current Meds  Medication Sig  . Calcium Carb-Cholecalciferol (CALCIUM 600+D) 600-800 MG-UNIT TABS Taking daily  . Cholecalciferol (VITAMIN D) 50 MCG (2000 UT) CAPS Take by mouth daily.  . fluticasone (FLONASE) 50 MCG/ACT nasal spray Place into both nostrils daily.  Marland Kitchen losartan (COZAAR) 50 MG tablet Take 1 tablet (50 mg total) by mouth daily.  . rosuvastatin (CRESTOR) 10 MG tablet Take 1 tablet (10 mg total) by mouth daily.       Allergies:   Patient has no known allergies.   Social History        Socioeconomic History  . Marital status: Married    Spouse name: Not on file  . Number of children: 2  . Years of education: Not on file  . Highest education level: Not on file  Occupational History  . Not on file  Tobacco Use  . Smoking status: Never  Smoker  . Smokeless tobacco: Never Used  Vaping Use  . Vaping Use: Never used  Substance and Sexual Activity  . Alcohol use: Yes    Comment: maybe 1 drink a month  . Drug use: No  . Sexual activity: Not Currently  Other Topics Concern  . Not on file  Social History Narrative  . Not on file   Social Determinants of Health      Financial Resource Strain:   . Difficulty of Paying Living Expenses: Not on file  Food Insecurity:   . Worried About Programme researcher, broadcasting/film/video in the Last Year: Not on file  . Ran Out of Food in the Last Year: Not on file  Transportation Needs:   . Lack of Transportation (Medical): Not on file  . Lack of Transportation (Non-Medical): Not on file  Physical Activity:   . Days of Exercise per Week: Not on file  . Minutes of Exercise per Session: Not on file  Stress:   . Feeling of Stress : Not on file  Social Connections:    . Frequency of Communication with Friends and Family: Not on file  . Frequency of Social Gatherings with Friends and Family: Not on file  . Attends Religious Services: Not on file  . Active Member of Clubs or Organizations: Not on file  . Attends Banker Meetings: Not on file  . Marital Status: Not on file     Family History: The patient's family history includes Heart attack (age of onset: 66) in her father; Hypertension in her mother; Stomach cancer in her mother. There is no history of Colon cancer, Colon polyps, Rectal cancer, Diabetes, or Stroke.  ROS:   Please see the history of present illness.    All other systems reviewed and are negative.  EKGs/Labs/Other Studies Reviewed:    The following studies were reviewed today: Cardiac CTA: FINDINGS: Aortic Root:  Aortic valve:trileaflet  Aortic valve calcium score: 1647  Aortic annulus:  Diameter: 18mm x 45mm  Perimeter: 31mm  Area: 325 cm^2  Calcifications: Mild calcification adjacent to left coronary cusp  Coronary height: Min Left - 4mm, Max Left - 71mm; Min Right - 25mm  Sinotubular height: Left cusp - 34mm; Right cusp - 61mm; Noncoronary cusp - 24mm  LVOT (as measured 3 mm below the annulus):  Diameter: 83mm x 38mm  Area: 318 mm^2  Calcifications: No calcifications  Aortic sinus width: Left cusp - 68mm; Right cusp - 28mm; Noncoronary cusp - 59mm  Sinotubular junction width: 43mm x 22mm  Optimum Fluoroscopic Angle for Delivery: LAO 27 CAU 3  Cardiac:  Right atrium: Normal size  Right ventricle: Normal size  Pulmonary arteries: Normal size  Pulmonary veins: Normal configuration  Left atrium: Mild enlargement  Left ventricle: Normal size  Pericardium: Normal thickness  Coronary arteries: Calcium score 476. Proximal to mid LAD heavily calcified, unable to rule out obstructive CAD  IMPRESSION: 1. Trileaflet aortic valve with severe  calcifications (calcium score 1647)  2. Aortic annulus measures 37mm x 41mm with area 325 mm^2 and perimeter 3mm. Mild annular calcification adjacent to left coronary cusp. Annular measurements suitable for delivery or 18mm Evolut valve.  3. Sufficient coronary to annulus distance, measuring 3mm to left main and 108mm to RCA  4. Optimum Fluoroscopic Angle for Delivery: LAO 27 CAU 3  5. Coronary calcium score 476 (74th percentile). Proximal to mid LAD heavily calcified, unable to rule out obstructive CAD  Echo: IMPRESSIONS    1. Left ventricular  ejection fraction, by estimation, is 70 to 75%. The  left ventricle has hyperdynamic function. The left ventricle has no  regional wall motion abnormalities. There is mild left ventricular  hypertrophy. Left ventricular diastolic  parameters are consistent with Grade I diastolic dysfunction (impaired  relaxation).  2. Severe mid-cavitary obstruction, mid-cavitary gradient in the left  ventricle with maximum instantaneous gradient 90 mmHg, Vmax 4.7 m/s.  3. Right ventricular systolic function is normal. The right ventricular  size is normal. Tricuspid regurgitation signal is inadequate for assessing  PA pressure.  4. The mitral valve is normal in structure. Trivial mitral valve  regurgitation. No evidence of mitral stenosis.  5. The aortic valve was not well visualized. There is moderate  calcification of the aortic valve. Aortic valve regurgitation is trivial.  Severe aortic valve stenosis. Aortic valve mean gradient measures 40.0  mmHg.  6. The inferior vena cava is normal in size with greater than 50%  respiratory variability, suggesting right atrial pressure of 3 mmHg.  7. Increased flow velocities may be secondary to anemia, thyrotoxicosis,  hyperdynamic or high flow state.   Conclusion(s)/Recommendation(s): Consider alternate imaging to better  assess aortic valve structure and function in setting of hyperdynamic  flow  and possible severe aortic stenosis vs. high flow state.   FINDINGS  Left Ventricle: Left ventricular ejection fraction, by estimation, is 70  to 75%. The left ventricle has hyperdynamic function. The left ventricle  has no regional wall motion abnormalities. The left ventricular internal  cavity size was normal in size.  There is mild left ventricular hypertrophy. Left ventricular diastolic  parameters are consistent with Grade I diastolic dysfunction (impaired  relaxation).   Right Ventricle: The right ventricular size is normal. No increase in  right ventricular wall thickness. Right ventricular systolic function is  normal. Tricuspid regurgitation signal is inadequate for assessing PA  pressure.   Left Atrium: Left atrial size was normal in size.   Right Atrium: Right atrial size was normal in size.   Pericardium: There is no evidence of pericardial effusion.   Mitral Valve: The mitral valve is normal in structure. Mild mitral annular  calcification. Trivial mitral valve regurgitation. No evidence of mitral  valve stenosis.   Tricuspid Valve: The tricuspid valve is normal in structure. Tricuspid  valve regurgitation is not demonstrated. No evidence of tricuspid  stenosis.   Aortic Valve: The aortic valve was not well visualized. There is moderate  calcification of the aortic valve. Aortic valve regurgitation is trivial.  Severe aortic stenosis is present. Aortic valve mean gradient measures  40.0 mmHg. Aortic valve peak  gradient measures 60.8 mmHg. Aortic valve area, by VTI measures 0.90 cm.   Pulmonic Valve: The pulmonic valve was not well visualized. Pulmonic valve  regurgitation is trivial. No evidence of pulmonic stenosis.   Aorta: The aortic root is normal in size and structure.   Venous: The inferior vena cava is normal in size with greater than 50%  respiratory variability, suggesting right atrial pressure of 3 mmHg.   IAS/Shunts: No atrial level shunt  detected by color flow Doppler.     LEFT VENTRICLE  PLAX 2D  LVIDd:     2.30 cm Diastology  LVIDs:     1.30 cm LV e' medial:  4.90 cm/s  LV PW:     1.10 cm LV E/e' medial: 14.5  LV IVS:    1.20 cm LV e' lateral:  5.77 cm/s  LVOT diam:   1.70 cm LV E/e' lateral:  12.3  LV SV:     63  LV SV Index:  45  LVOT Area:   2.27 cm     RIGHT VENTRICLE  RV Basal diam: 1.70 cm  RV S prime:   20.80 cm/s  TAPSE (M-mode): 1.9 cm   LEFT ATRIUM       Index    RIGHT ATRIUM     Index  LA diam:    3.10 cm 2.21 cm/m RA Area:   6.95 cm  LA Vol (A2C):  37.6 ml 26.77 ml/m RA Volume:  11.80 ml 8.40 ml/m  LA Vol (A4C):  23.2 ml 16.51 ml/m  LA Biplane Vol: 30.3 ml 21.57 ml/m  AORTIC VALVE  AV Area (Vmax):  0.84 cm  AV Area (Vmean):  0.88 cm  AV Area (VTI):   0.90 cm  AV Vmax:      390.00 cm/s  AV Vmean:     277.500 cm/s  AV VTI:      0.702 m  AV Peak Grad:   60.8 mmHg  AV Mean Grad:   40.0 mmHg  LVOT Vmax:     144.00 cm/s  LVOT Vmean:    107.000 cm/s  LVOT VTI:     0.278 m  LVOT/AV VTI ratio: 0.40    AORTA  Ao Root diam: 2.40 cm   MITRAL VALVE  MV Area (PHT): 2.45 cm   SHUNTS  MV Decel Time: 310 msec   Systemic VTI: 0.28 m  MV E velocity: 71.00 cm/s  Systemic Diam: 1.70 cm  MV A velocity: 149.00 cm/s  MV E/A ratio: 0.48   EKG:  EKG is not ordered today.   Recent Labs: 05/04/2020: ALT 12; Hemoglobin 14.9; Platelets 332; TSH 2.03 07/14/2020: BUN 9; Creatinine, Ser 0.63; Potassium 4.3; Sodium 139  Recent Lipid Panel Labs (Brief)          Component Value Date/Time   CHOL 266 (H) 05/04/2020 1007   TRIG 111 05/04/2020 1007   HDL 79 05/04/2020 1007   CHOLHDL 3.4 05/04/2020 1007   VLDL 23.4 05/02/2019 0951   LDLCALC 164 (H) 05/04/2020 1007   LDLDIRECT 156.8 10/24/2012 1207       Risk Assessment/Calculations:       Physical Exam:    VS:  BP (!)  160/98   Pulse 84   Ht 4\' 11"  (1.499 m)   Wt 102 lb (46.3 kg)   BMI 20.60 kg/m        Wt Readings from Last 3 Encounters:  07/22/20 102 lb (46.3 kg)  07/07/20 103 lb 9.6 oz (47 kg)  06/10/20 104 lb (47.2 kg)     GEN:  Well nourished, well developed in no acute distress HEENT: Normal NECK: No JVD; No carotid bruits LYMPHATICS: No lymphadenopathy CARDIAC: RRR, 3/6 harsh late peaking crescendo decrescendo murmur at the right upper sternal border, no diastolic murmur, absent A2 RESPIRATORY:  Clear to auscultation without rales, wheezing or rhonchi  ABDOMEN: Soft, non-tender, non-distended MUSCULOSKELETAL:  No edema; No deformity  SKIN: Warm and dry NEUROLOGIC:  Alert and oriented x 3 PSYCHIATRIC:  Normal affect   ASSESSMENT:    1. Aortic stenosis, severe    PLAN:    In order of problems listed above:  4. 87 year old healthy woman who has severe, stage C1 aortic stenosis (asymptomatic).  Her echocardiogram is personally reviewed and demonstrates vigorous LV systolic function with LVEF greater than 70%.  She has mid cavitary obliteration and elevated mid cavitary velocities.  The patient has  a poorly visualized aortic valve by echo but the valve does appear calcified and restricted.  Peak and mean transaortic gradients are 68 and 42 mmHg, respectively.  The dimensionless index is 0.4 and a calculated aortic valve area 0.9 cm.  Coronary CTA confirms heavy calcification of the aortic valve with an aortic valve calcium score of 1647.  Her exam is also consistent with severe aortic stenosis with a late peaking murmur and absent A2.  We had a lengthy discussion today about the natural history of severe aortic stenosis.  We discussed cardinal symptoms and I asked her to keep a close eye out for the development of shortness of breath, progressive fatigue, chest discomfort, or lightheadedness/syncope.  We discussed potential treatment options of surgical aortic valve replacement, TAVR,  and medical therapy/surveillance.  Considering her advanced age, we would likely treat her with TAVR when the time comes for intervention.  She understands that her aortic stenosis will likely progress and may require intervention for development of symptoms or progression into any high risk characteristics of a severely elevated gradient or changes in LV function.  We talked about the idea of earlier intervention with TAVR versus close surveillance and we both favor surveillance at this point.  I am going to check a BNP just to be sure that there is no enzymatic evidence of heart failure/myocardial strain.  As long as her BNP is in the near normal range, I would anticipate close clinical follow-up.  She is scheduled to see Dr. Gardiner Rhyme in 3 months and I think she should have a repeat echocardiogram in about 6 months.  I will plan to see her back at the time of her repeat echo and we can discuss any significant changes.    ADDENDUM 08/27/2019: The patient is examined in the Short Stay area. I have discussed indications for cardiac cath as part of her evaluation for treatment of severe aortic stenosis as outlined above. BNP was markedly elevated. Patient has expressed interest now in pursuing treatment. I have reviewed the risks, indications, and alternatives to cardiac catheterization, possible angioplasty, and stenting with the patient. Risks include but are not limited to bleeding, infection, vascular injury, stroke, myocardial infection, arrhythmia, kidney injury, radiation-related injury in the case of prolonged fluoroscopy use, emergency cardiac surgery, and death. The patient understands the risks of serious complication is 1-2 in [id number] with diagnostic cardiac cath and 1-2% or less with angioplasty/stenting.   Sherren Mocha 08/26/2020 3:03 PM

## 2020-08-27 ENCOUNTER — Encounter (HOSPITAL_COMMUNITY): Payer: Self-pay | Admitting: Cardiovascular Disease

## 2020-08-28 ENCOUNTER — Other Ambulatory Visit: Payer: Self-pay

## 2020-08-28 DIAGNOSIS — I350 Nonrheumatic aortic (valve) stenosis: Secondary | ICD-10-CM

## 2020-08-29 ENCOUNTER — Telehealth: Payer: Self-pay | Admitting: Cardiology

## 2020-08-29 NOTE — Telephone Encounter (Signed)
Pt's neighbor called, her Thumb area and knuckles are swollen and mildly red but no tenderness or heat to touch.  Encouraged her to open and close hand more often and raise hand above her heart.  Ok to use some ice. They will call back if not improved.  Will have triage call her back on Monday and let Dr. Gilman Schmidt know

## 2020-08-31 NOTE — Telephone Encounter (Signed)
Follow up via mychart

## 2020-09-02 ENCOUNTER — Ambulatory Visit (HOSPITAL_COMMUNITY)
Admission: RE | Admit: 2020-09-02 | Discharge: 2020-09-02 | Disposition: A | Discharge status: Home / Self Care | Payer: Medicare Other | Source: Ambulatory Visit | Attending: Cardiovascular Disease | Admitting: Cardiovascular Disease

## 2020-09-02 ENCOUNTER — Other Ambulatory Visit: Payer: Self-pay

## 2020-09-02 ENCOUNTER — Encounter (HOSPITAL_COMMUNITY): Payer: Self-pay

## 2020-09-02 DIAGNOSIS — I350 Nonrheumatic aortic (valve) stenosis: Secondary | ICD-10-CM | POA: Insufficient documentation

## 2020-09-02 DIAGNOSIS — I708 Atherosclerosis of other arteries: Secondary | ICD-10-CM | POA: Diagnosis not present

## 2020-09-02 DIAGNOSIS — I771 Stricture of artery: Secondary | ICD-10-CM | POA: Diagnosis not present

## 2020-09-02 DIAGNOSIS — I358 Other nonrheumatic aortic valve disorders: Secondary | ICD-10-CM | POA: Diagnosis not present

## 2020-09-02 DIAGNOSIS — I2510 Atherosclerotic heart disease of native coronary artery without angina pectoris: Secondary | ICD-10-CM | POA: Diagnosis not present

## 2020-09-02 DIAGNOSIS — Z01818 Encounter for other preprocedural examination: Secondary | ICD-10-CM | POA: Diagnosis not present

## 2020-09-02 MED ORDER — IOHEXOL 350 MG/ML SOLN
80.0000 mL | Freq: Once | INTRAVENOUS | Status: AC | PRN
  Administered 2020-09-02: 80 mL via INTRAVENOUS

## 2020-09-03 ENCOUNTER — Other Ambulatory Visit (HOSPITAL_COMMUNITY): Payer: Medicare Other

## 2020-09-08 ENCOUNTER — Other Ambulatory Visit: Payer: Self-pay

## 2020-09-08 ENCOUNTER — Institutional Professional Consult (permissible substitution): Payer: Medicare Other | Admitting: Surgery

## 2020-09-08 ENCOUNTER — Encounter: Payer: Self-pay | Admitting: Surgery

## 2020-09-08 VITALS — BP 211/102 | HR 90 | Resp 20 | Ht 59.0 in | Wt 99.0 lb

## 2020-09-08 DIAGNOSIS — I350 Nonrheumatic aortic (valve) stenosis: Secondary | ICD-10-CM

## 2020-09-08 NOTE — H&P (View-Only) (Signed)
Patient ID: Renee Figueroa, female   DOB: [birth year], 87 y.o.   MRN: [id number]  [facility] [facility] CONSULTATION REPORT  Referring Provider is Binnie Rail, MD Primary Cardiologist is No primary care provider on file. PCP is Burns, Claudina Lick, MD  Chief Complaint  Patient presents with  . Aortic Stenosis    New patient consultation, review all studies  . New Patient (Initial Visit)    HPI:  The patient is an 87 year old woman with a history of hypertension, hyperlipidemia, osteoporosis and arthritis, and a long history of a heart murmur.  She was noted to have a louder heart murmur by her PCP and underwent 2D echocardiogram on 05/20/2020.  This showed a mean gradient across aortic valve of 40 mmHg with a peak gradient of 61 mmHg.  Aortic valve area was 0.9 cm.  Left ventricular ejection fraction was 70 to 75% with hyperdynamic function and mild LVH with grade 1 diastolic dysfunction.  There was felt to be severe mid cavitary obstruction with a mid cavitary gradient of 90 mmHg.  She was evaluated by Dr. Burt Knack and was found to have a BNP of 1512.  She underwent cardiac catheterization on 08/26/2020 showing severe two-vessel coronary disease with 90% proximal and 80% mid LAD stenosis as well as 80% proximal RCA stenosis.  The mean gradient across the aortic valve was measured at 53 mmHg with a peak to peak gradient of 79 mmHg.  Calculated aortic valve area was 0.48 cm  The patient is here today with her husband she reports maintaining a fair level of activity taking care of her house and volunteering at her church.  She does some walking but not as much as she has in the past which she says is due to her arthritis.  She denies any exertional shortness of breath.  She does get tired but said that she is up at 7 in the morning at about 3 in the afternoon has to take a nap before she keeps going.  She denies any chest pain or pressure.   She has had no dizziness or syncope.  She denies orthopnea PND.  She denies peripheral edema.  Past Medical History:  Diagnosis Date  . Allergy    seasonal  . Arthritis    mild  . Basal cell carcinoma of anterior chest    skin  . Colon polyps    adenomatous  . Diverticulosis   . Heart murmur   . Herpes zoster 04/2011   C 6  LUE  . Hyperlipidemia   . Hypertension   . Osteoporosis   . Renal calculi 1962    X 1     Past Surgical History:  Procedure Laterality Date  . colonoscopy with polypectomy  2004    adenomatous; Dr Henrene Pastor. Neg 2013  . DILATION AND CURETTAGE OF UTERUS     age 87  . G 2 P 2    . RIGHT/LEFT HEART CATH AND CORONARY ANGIOGRAPHY N/A 08/26/2020   Procedure: RIGHT/LEFT HEART CATH AND CORONARY ANGIOGRAPHY;  Surgeon: Sherren Mocha, MD;  Location: [facility];  Service: Cardiovascular;  Laterality: N/A;  . TONSILLECTOMY AND ADENOIDECTOMY      Family History  Problem Relation Age of Onset  . Hypertension Mother   . Stomach cancer Mother        died @ 43  . Heart attack Father 5  . Colon cancer Neg Hx   . Colon  polyps Neg Hx   . Rectal cancer Neg Hx   . Diabetes Neg Hx   . Stroke Neg Hx     Social History   Socioeconomic History  . Marital status: Married    Spouse name: Not on file  . Number of children: 2  . Years of education: Not on file  . Highest education level: Not on file  Occupational History  . Not on file  Tobacco Use  . Smoking status: Never Smoker  . Smokeless tobacco: Never Used  Vaping Use  . Vaping Use: Never used  Substance and Sexual Activity  . Alcohol use: Yes    Comment: maybe 1 drink a month  . Drug use: No  . Sexual activity: Not Currently  Other Topics Concern  . Not on file  Social History Narrative  . Not on file   Social Determinants of Health   Financial Resource Strain: Not on file  Food Insecurity: Not on file  Transportation Needs: Not on file  Physical Activity: Not on file  Stress: Not on  file  Social Connections: Not on file  Intimate Partner Violence: Not on file    Current Outpatient Medications  Medication Sig Dispense Refill  . aspirin EC 81 MG tablet Take 1 tablet (81 mg total) by mouth daily. Swallow whole. 150 tablet 2  . carboxymethylcellulose (REFRESH PLUS) 0.5 % SOLN Place 1 drop into both eyes 3 (three) times daily as needed (dry eyes).    . Cholecalciferol (VITAMIN D) 50 MCG (2000 UT) CAPS Take 2,000 Units by mouth daily.    . fluticasone (FLONASE) 50 MCG/ACT nasal spray Place 1 spray into both nostrils daily as needed for allergies or rhinitis.    Marland Kitchen losartan (COZAAR) 50 MG tablet Take 1 tablet (50 mg total) by mouth daily. 90 tablet 3  . rosuvastatin (CRESTOR) 10 MG tablet Take 1 tablet (10 mg total) by mouth daily. 90 tablet 3   No current facility-administered medications for this visit.    No Known Allergies    Review of Systems:   General:  normal appetite, + decreased energy, no weight gain, no weight loss, no fever  Cardiac:  no chest pain with exertion, no chest pain at rest, noSOB with  exertion, no resting SOB, no PND, no orthopnea, no palpitations, no arrhythmia, no atrial fibrillation, no LE edema, no dizzy spells, no syncope  Respiratory:  no shortness of breath, no home oxygen, no productive cough, no dry cough, no bronchitis, no wheezing, no hemoptysis, no asthma, no pain with inspiration or cough, no sleep apnea, no CPAP at night  GI:   no difficulty swallowing, no reflux, no frequent heartburn, no hiatal hernia, no abdominal pain, no constipation, no diarrhea, no hematochezia, no hematemesis, no melena  GU:   no dysuria,  no frequency, no urinary tract infection, no hematuria, no kidney stones, no kidney disease  Vascular:  no pain suggestive of claudication, no pain in feet, no leg cramps, no varicose veins, no DVT, no non-healing foot ulcer  Neuro:   no stroke, no TIA's, no seizures, no headaches, no temporary blindness one eye,  no slurred  speech, no peripheral neuropathy, no chronic pain, no instability of gait, no memory/cognitive dysfunction  Musculoskeletal: + arthritis, no joint swelling, no myalgias, no difficulty walking, normal mobility   Skin:   no rash, no itching, no skin infections, no pressure sores or ulcerations  Psych:   no anxiety, no depression, no nervousness, no unusual recent stress  Eyes:   no blurry vision, no floaters, no recent vision changes, no glasses or contacts  ENT:   no hearing loss, no loose or painful teeth, no dentures, last saw dentist 08/2020  Hematologic:  no easy bruising, no abnormal bleeding, no clotting disorder, no frequent epistaxis  Endocrine:  no diabetes, does not check CBG's at home     Physical Exam:   BP (!) 211/102 (BP Location: Left Arm, Patient Position: Sitting, Cuff Size: Small)   Pulse 90   Resp 20   Ht 4\' 11"  (1.499 m)   Wt 99 lb (44.9 kg)   SpO2 97% Comment: RA  BMI 20.00 kg/m   General:  Thin, small frame woman, well-appearing  HEENT:  Unremarkable, NCAT, PERLA, EOMI  Neck:   no JVD, no bruits, no adenopathy   Chest:   clear to auscultation, symmetrical breath sounds, no wheezes, no rhonchi   CV:   RRR, grade lll/VI crescendo/decrescendo murmur heard best at RSB,  no diastolic murmur  Abdomen:  soft, non-tender, no masses   Extremities:  warm, well-perfused, pulses palpable at ankles, no LE edema  Rectal/GU  Deferred  Neuro:   Grossly non-focal and symmetrical throughout  Skin:   Clean and dry, no rashes, no breakdown   Diagnostic Tests:  ECHOCARDIOGRAM REPORT       Patient Name:  LARI ARIAZ Date of Exam: 05/20/2020  Medical Rec #: [id number]    Height:    59.0 in  Accession #:  [id number]   Weight:    105.4 lb  Date of Birth: 09/25/1933    BSA:     1.405 m  Patient Age:  70 years    BP:      158/80 mmHg  Patient Gender: F        HR:      104 bpm.  Exam Location: [address]   Procedure: 2D  Echo, Cardiac Doppler and Color Doppler   Indications:  R01.1 Murmur    History:    Patient has no prior history of Echocardiogram  examinations.         Risk Factors:Hypertension and Dyslipidemia. Hyperglycemia.         Degerative joint disease.    Sonographer:  Diamond Nickel RCS  Referring Phys: [id number] [place]    1. Left ventricular ejection fraction, by estimation, is 70 to 75%. The  left ventricle has hyperdynamic function. The left ventricle has no  regional wall motion abnormalities. There is mild left ventricular  hypertrophy. Left ventricular diastolic  parameters are consistent with Grade I diastolic dysfunction (impaired  relaxation).  2. Severe mid-cavitary obstruction, mid-cavitary gradient in the left  ventricle with maximum instantaneous gradient 90 mmHg, Vmax 4.7 m/s.  3. Right ventricular systolic function is normal. The right ventricular  size is normal. Tricuspid regurgitation signal is inadequate for assessing  PA pressure.  4. The mitral valve is normal in structure. Trivial mitral valve  regurgitation. No evidence of mitral stenosis.  5. The aortic valve was not well visualized. There is moderate  calcification of the aortic valve. Aortic valve regurgitation is trivial.  Severe aortic valve stenosis. Aortic valve mean gradient measures 40.0  mmHg.  6. The inferior vena cava is normal in size with greater than 50%  respiratory variability, suggesting right atrial pressure of 3 mmHg.  7. Increased flow velocities may be secondary to anemia, thyrotoxicosis,  hyperdynamic or high flow state.   Conclusion(s)/Recommendation(s): Consider alternate imaging to better  assess aortic valve structure and function in setting of hyperdynamic flow  and possible severe aortic stenosis vs. high flow state.   FINDINGS  Left Ventricle: Left ventricular ejection fraction, by estimation, is 70  to 75%. The left  ventricle has hyperdynamic function. The left ventricle  has no regional wall motion abnormalities. The left ventricular internal  cavity size was normal in size.  There is mild left ventricular hypertrophy. Left ventricular diastolic  parameters are consistent with Grade I diastolic dysfunction (impaired  relaxation).   Right Ventricle: The right ventricular size is normal. No increase in  right ventricular wall thickness. Right ventricular systolic function is  normal. Tricuspid regurgitation signal is inadequate for assessing PA  pressure.   Left Atrium: Left atrial size was normal in size.   Right Atrium: Right atrial size was normal in size.   Pericardium: There is no evidence of pericardial effusion.   Mitral Valve: The mitral valve is normal in structure. Mild mitral annular  calcification. Trivial mitral valve regurgitation. No evidence of mitral  valve stenosis.   Tricuspid Valve: The tricuspid valve is normal in structure. Tricuspid  valve regurgitation is not demonstrated. No evidence of tricuspid  stenosis.   Aortic Valve: The aortic valve was not well visualized. There is moderate  calcification of the aortic valve. Aortic valve regurgitation is trivial.  Severe aortic stenosis is present. Aortic valve mean gradient measures  40.0 mmHg. Aortic valve peak  gradient measures 60.8 mmHg. Aortic valve area, by VTI measures 0.90 cm.   Pulmonic Valve: The pulmonic valve was not well visualized. Pulmonic valve  regurgitation is trivial. No evidence of pulmonic stenosis.   Aorta: The aortic root is normal in size and structure.   Venous: The inferior vena cava is normal in size with greater than 50%  respiratory variability, suggesting right atrial pressure of 3 mmHg.   IAS/Shunts: No atrial level shunt detected by color flow Doppler.     LEFT VENTRICLE  PLAX 2D  LVIDd:     2.30 cm Diastology  LVIDs:     1.30 cm LV e' medial:  4.90 cm/s  LV PW:      1.10 cm LV E/e' medial: 14.5  LV IVS:    1.20 cm LV e' lateral:  5.77 cm/s  LVOT diam:   1.70 cm LV E/e' lateral: 12.3  LV SV:     63  LV SV Index:  45  LVOT Area:   2.27 cm     RIGHT VENTRICLE  RV Basal diam: 1.70 cm  RV S prime:   20.80 cm/s  TAPSE (M-mode): 1.9 cm   LEFT ATRIUM       Index    RIGHT ATRIUM     Index  LA diam:    3.10 cm 2.21 cm/m RA Area:   6.95 cm  LA Vol (A2C):  37.6 ml 26.77 ml/m RA Volume:  11.80 ml 8.40 ml/m  LA Vol (A4C):  23.2 ml 16.51 ml/m  LA Biplane Vol: 30.3 ml 21.57 ml/m  AORTIC VALVE  AV Area (Vmax):  0.84 cm  AV Area (Vmean):  0.88 cm  AV Area (VTI):   0.90 cm  AV Vmax:      390.00 cm/s  AV Vmean:     277.500 cm/s  AV VTI:      0.702 m  AV Peak Grad:   60.8 mmHg  AV Mean Grad:   40.0 mmHg  LVOT Vmax:     144.00 cm/s  LVOT  Vmean:    107.000 cm/s  LVOT VTI:     0.278 m  LVOT/AV VTI ratio: 0.40    AORTA  Ao Root diam: 2.40 cm   MITRAL VALVE  MV Area (PHT): 2.45 cm   SHUNTS  MV Decel Time: 310 msec   Systemic VTI: 0.28 m  MV E velocity: 71.00 cm/s  Systemic Diam: 1.70 cm  MV A velocity: 149.00 cm/s  MV E/A ratio: 0.48   Cherlynn Kaiser MD  Electronically signed by Cherlynn Kaiser MD  Signature Date/Time: 05/21/2020/7:20:32 AM     Physicians  Panel Physicians Referring Physician Case Authorizing Physician  Sherren Mocha, MD (Primary)      Procedures  RIGHT/LEFT HEART CATH AND CORONARY ANGIOGRAPHY   Conclusion    Prox RCA lesion is 80% stenosed.  Prox LAD lesion is 90% stenosed.  Mid LAD-1 lesion is 80% stenosed.  Mid LAD-2 lesion is 50% stenosed.  There is severe aortic valve stenosis.  LV end diastolic pressure is normal.   1. Severe 2 vessel CAD involving segmental severe calcific stenosis in the proximal and mid-LAD, and severe focal eccentric stenosis of the proximal RCA 2. Severe aortic stenosis with  mean transaortic gradient 53 mmHg and peak to peak gradient 79 mmHg, calculated 0.48  Plan: formal surgical consultation as part of multidisciplinary approach to her care. Options include PCI/TAVR versus surgical AVR/CABG.    Indications  Severe aortic stenosis [I35.0 (ICD-10-CM)]   Procedural Details  Technical Details INDICATION: Severe aortic stenosis, elevated BNP  PROCEDURAL DETAILS: There was an indwelling IV in a right antecubital vein. Using normal sterile technique, the IV was changed out for a 5 Fr brachial sheath over a 0.018 inch wire. The right wrist was then prepped, draped, and anesthetized with 1% lidocaine. Using the modified Seldinger technique a 5/6 French Slender sheath was placed in the right radial artery. Intra-arterial verapamil was administered through the radial artery sheath. IV heparin was administered after a JR4 catheter was advanced into the central aorta. A Swan-Ganz catheter was used for the right heart catheterization. Standard protocol was followed for recording of right heart pressures and sampling of oxygen saturations. Fick cardiac output was calculated. Standard Judkins catheters were used for selective coronary angiography. LV pressure is recorded and an aortic valve pullback is performed. An AL-1 catheter is used to direct a straight tip wire across the aortic valve. There were no immediate procedural complications. The patient was transferred to the post catheterization recovery area for further monitoring.    Estimated blood loss <50 mL.   During this procedure medications were administered to achieve and maintain moderate conscious sedation while the patient's heart rate, blood pressure, and oxygen saturation were continuously monitored and I was present face-to-face 100% of this time.   Medications (Filter: Administrations occurring from 1444 to 1550 on 08/26/20) (important) Continuous medications are totaled by the amount administered until  08/26/20 1550.    Heparin (Porcine) in NaCl 1000-0.9 UT/500ML-% SOLN (mL) Total volume:  1,000 mL  Date/Time Rate/Dose/Volume Action   08/26/20 1455 500 mL Given   1455 500 mL Given    fentaNYL (SUBLIMAZE) injection (mcg) Total dose:  25 mcg  Date/Time Rate/Dose/Volume Action   08/26/20 1507 25 mcg Given    midazolam (VERSED) injection (mg) Total dose:  2 mg  Date/Time Rate/Dose/Volume Action   08/26/20 1507 2 mg Given    lidocaine (PF) (XYLOCAINE) 1 % injection (mL) Total volume:  5 mL  Date/Time Rate/Dose/Volume Action   08/26/20  1515 2 mL Given   1520 3 mL Given    Radial Cocktail/Verapamil only (mL) Total volume:  10 mL  Date/Time Rate/Dose/Volume Action   08/26/20 1522 10 mL Given    heparin sodium (porcine) injection (Units) Total dose:  2,500 Units  Date/Time Rate/Dose/Volume Action   08/26/20 1527 2,500 Units Given    iohexol (OMNIPAQUE) 350 MG/ML injection (mL) Total volume:  55 mL  Date/Time Rate/Dose/Volume Action   08/26/20 1541 55 mL Given    Sedation Time  Sedation Time Physician-1: 33 minutes 58 seconds   Contrast  Medication Name Total Dose  iohexol (OMNIPAQUE) 350 MG/ML injection 55 mL    Radiation/Fluoro  Fluoro time: 5.1 (min) DAP: 5534 (mGycm2) Cumulative Air Kerma: 103 (mGy)   Coronary Findings   Diagnostic Dominance: Right  Left Main  The vessel exhibits minimal luminal irregularities.  Left Anterior Descending  Prox LAD lesion is 90% stenosed. The lesion is severely calcified.  Mid LAD-1 lesion is 80% stenosed.  Mid LAD-2 lesion is 50% stenosed. The lesion is calcified.  Left Circumflex  There is mild diffuse disease throughout the vessel.  Right Coronary Artery  Prox RCA lesion is 80% stenosed. The lesion is eccentric.   Intervention   No interventions have been documented.  Right Heart  Right Heart Pressures LV EDP is normal.   Left Heart  Aortic Valve There is severe aortic valve stenosis. The  aortic valve is calcified. There is restricted aortic valve motion. Mean gradient 53 mmHg, peak to peak gradient 79 mmHg, and calculated AVA 0.48   Coronary Diagrams   Diagnostic Dominance: Right    Intervention    Implants    No implant documentation for this case.   Syngo Images  Show images for CARDIAC CATHETERIZATION  Images on Long Term Storage  Show images for Shanda, [place] to Procedure Log  Procedure Log     Hemo Data  Flowsheet Row Most Recent Value  Fick Cardiac Output 4.05 L/min  Fick Cardiac Output Index 2.93 (L/min)/BSA  Aortic Mean Gradient 53.1 mmHg  Aortic Peak Gradient 79 mmHg  Aortic Valve Area 0.48  Aortic Value Area Index 0.35 cm2/BSA  RA A Wave 1 mmHg  RA V Wave 0 mmHg  RA Mean 0 mmHg  RV Systolic Pressure 24 mmHg  RV Diastolic Pressure 0 mmHg  RV EDP 1 mmHg  PA Systolic Pressure 16 mmHg  PA Diastolic Pressure 4 mmHg  PA Mean 8 mmHg  PW A Wave 3 mmHg  PW V Wave 3 mmHg  PW Mean 3 mmHg  AO Systolic Pressure [id number] mmHg  AO Diastolic Pressure 65 mmHg  AO Mean 95 mmHg  LV Systolic Pressure 123456 mmHg  LV Diastolic Pressure 7 mmHg  LV EDP 10 mmHg  AOp Systolic Pressure [id number] mmHg  AOp Diastolic Pressure 61 mmHg  AOp Mean Pressure 97 mmHg  LVp Systolic Pressure [id number] mmHg  LVp Diastolic Pressure 11 mmHg  LVp EDP Pressure 19 mmHg  QP/QS 1  TPVR Index 2.73 HRUI  TSVR Index 32.41 HRUI  TPVR/TSVR Ratio 0.08    ADDENDUM REPORT: 07/02/2020 22:37  CLINICAL DATA:  5 -year-old female with severe aortic stenosis being evaluated for a TAVR procedure.  EXAM: Cardiac TAVR CT  TECHNIQUE: The patient was scanned on a [employer]. A 120 kV retrospective scan was triggered in the descending thoracic aorta at 111 HU's. Gantry rotation speed was 250 msecs and collimation was .6 mm. No beta blockade or nitro  were given. The 3D data set was reconstructed in 5% intervals of the R-R cycle. Systolic and diastolic phases were  analyzed on a dedicated work station using MPR, MIP and VRT modes. The patient received 80 cc of contrast.  FINDINGS: Aortic Root:  Aortic valve:trileaflet  Aortic valve calcium score: 1647  Aortic annulus:  Diameter: 22mm x 36mm  Perimeter: 18mm  Area: 325 cm^2  Calcifications: Mild calcification adjacent to left coronary cusp  Coronary height: Min Left - 27mm, Max Left - 50mm; Min Right - 29mm  Sinotubular height: Left cusp - 44mm; Right cusp - 55mm; Noncoronary cusp - 2mm  LVOT (as measured 3 mm below the annulus):  Diameter: 64mm x 50mm  Area: 318 mm^2  Calcifications: No calcifications  Aortic sinus width: Left cusp - 16mm; Right cusp - 25mm; Noncoronary cusp - 40mm  Sinotubular junction width: 48mm x 74mm  Optimum Fluoroscopic Angle for Delivery: LAO 27 CAU 3  Cardiac:  Right atrium: Normal size  Right ventricle: Normal size  Pulmonary arteries: Normal size  Pulmonary veins: Normal configuration  Left atrium: Mild enlargement  Left ventricle: Normal size  Pericardium: Normal thickness  Coronary arteries: Calcium score 476. Proximal to mid LAD heavily calcified, unable to rule out obstructive CAD  IMPRESSION: 1. Trileaflet aortic valve with severe calcifications (calcium score 1647)  2. Aortic annulus measures 26mm x 59mm with area 325 mm^2 and perimeter 57mm. Mild annular calcification adjacent to left coronary cusp. Annular measurements suitable for delivery or 57mm Evolut valve.  3. Sufficient coronary to annulus distance, measuring 14mm to left main and 15mm to RCA  4. Optimum Fluoroscopic Angle for Delivery:  LAO 27 CAU 3  5. Coronary calcium score 476 (74th percentile). Proximal to mid LAD heavily calcified, unable to rule out obstructive CAD   Electronically Signed   By: Oswaldo Milian MD   On: 07/02/2020 22:37   Addended by Donato Heinz, MD on 07/02/2020 10:39 PM     Study Result  Narrative & Impression  EXAM: OVER-READ INTERPRETATION  CT CHEST  The following report is an over-read performed by radiologist Dr. Vinnie Langton of [facility], [place] on 06/30/2020. This over-read does not include interpretation of cardiac or coronary anatomy or pathology. The coronary calcium score/coronary CTA interpretation by the cardiologist is attached.  COMPARISON:  Chest CT 01/13/2004.  FINDINGS: Aortic atherosclerosis. Within the visualized portions of the thorax there are no suspicious appearing pulmonary nodules or masses, there is no acute consolidative airspace disease, no pleural effusions, no pneumothorax and no lymphadenopathy. Visualized portions of the upper abdomen are unremarkable. There are no aggressive appearing lytic or blastic lesions noted in the visualized portions of the skeleton.  IMPRESSION: 1.  Aortic Atherosclerosis (ICD10-I70.0).  Electronically Signed: By: Vinnie Langton M.D. On: 06/30/2020 13:33    Narrative & Impression  CLINICAL DATA:  87 year old female with history of severe aortic stenosis. Preprocedural study prior to potential transcatheter aortic valve replacement (TAVR) procedure.  EXAM: CT ANGIOGRAPHY CHEST, ABDOMEN AND PELVIS  TECHNIQUE: Multidetector CT imaging through the chest, abdomen and pelvis was performed using the standard protocol during bolus administration of intravenous contrast. Multiplanar reconstructed images and MIPs were obtained and reviewed to evaluate the vascular anatomy.  CONTRAST:  90mL OMNIPAQUE IOHEXOL 350 MG/ML SOLN  COMPARISON:  Cardiac CTA 06/30/2020.  FINDINGS: CTA CHEST FINDINGS  Cardiovascular: Heart size is normal. There is no significant pericardial fluid, thickening or pericardial calcification. There is aortic atherosclerosis, as well as atherosclerosis of the  great vessels of the mediastinum and the coronary arteries, including calcified  atherosclerotic plaque in the left anterior descending coronary artery. Severe thickening calcification of the aortic valve.  Mediastinum/Lymph Nodes: No pathologically enlarged mediastinal or hilar lymph nodes. Esophagus is unremarkable in appearance. No axillary lymphadenopathy.  Lungs/Pleura: No suspicious appearing pulmonary nodules or masses are noted. No acute consolidative airspace disease. No pleural effusions.  Musculoskeletal/Soft Tissues: There are no aggressive appearing lytic or blastic lesions noted in the visualized portions of the skeleton.  CTA ABDOMEN AND PELVIS FINDINGS  Hepatobiliary: No suspicious cystic or solid hepatic lesions. No intra or extrahepatic biliary ductal dilatation. Gallbladder is normal in appearance.  Pancreas: No pancreatic mass. No pancreatic ductal dilatation. No pancreatic or peripancreatic fluid collections or inflammatory changes.  Spleen: Unremarkable.  Adrenals/Urinary Tract: Bilateral kidneys and adrenal glands are normal in appearance. No hydroureteronephrosis. Urinary bladder is unremarkable in appearance.  Stomach/Bowel: Normal appearance of the stomach. No pathologic dilatation of small bowel or colon. Numerous colonic diverticulae are noted, particularly in the sigmoid colon, without surrounding inflammatory changes to suggest an acute diverticulitis at this time. The appendix is not confidently identified and may be surgically absent. Regardless, there are no inflammatory changes noted adjacent to the cecum to suggest the presence of an acute appendicitis at this time.  Vascular/Lymphatic: Aortic atherosclerosis with penetrating ulcer measuring up to 8 mm in the infrarenal abdominal aorta (axial image 164 of series 5). Vascular findings and measurements pertinent to potential TAVR procedure, as detailed below. No lymphadenopathy noted in the abdomen or pelvis.  Reproductive: Uterus and ovaries are a  trophic.  Other: No significant volume of ascites.  No pneumoperitoneum.  Musculoskeletal: Grade 2 anterolisthesis of L4 upon L5. There are no aggressive appearing lytic or blastic lesions noted in the visualized portions of the skeleton.  VASCULAR MEASUREMENTS PERTINENT TO TAVR:  AORTA:  Minimal Aortic Diameter-13 x 12 mm  Severity of Aortic Calcification-mild  RIGHT PELVIS:  Right Common Iliac Artery -  Minimal Diameter-7.8 x 8.1 mm  Tortuosity-mild to moderate  Calcification-none  Right External Iliac Artery -  Minimal Diameter-6.3 x 5.9 mm  Tortuosity-extreme  Calcification-none  Right Common Femoral Artery -  Minimal Diameter-6.3 x 6.1 mm  Tortuosity-mild  Calcification-none  LEFT PELVIS:  Left Common Iliac Artery -  Minimal Diameter-7.3 x 7.6 mm  Tortuosity-mild-to-moderate  Calcification-mild  Left External Iliac Artery -  Minimal Diameter-5.6 x 5.6 mm  Tortuosity-extreme  Calcification-none  Left Common Femoral Artery -  Minimal Diameter-6.5 x 5.9 mm  Tortuosity-mild  Calcification-none  Review of the MIP images confirms the above findings.  IMPRESSION: 1. Vascular findings and measurements pertinent to potential TAVR procedure, as detailed above. 2. Severe thickening calcification of the aortic valve, compatible with the reported clinical history of severe aortic stenosis. 3. Aortic atherosclerosis, in addition to left anterior descending coronary artery disease. 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 4. Additional incidental findings, as above.   Electronically Signed   By: Vinnie Langton M.D.   On: 09/02/2020 15:01    [facility] Adult Cardiac Surgery Database Version 4.20 RISK SCORES Procedure: AVR + CAB Risk of Mortality: 4.597% Renal Failure: 1.797% Permanent Stroke: 2.516% Prolonged Ventilation: 15.009% DSW  Infection: 0.095% Reoperation: 5.891% Morbidity or Mortality: 22.921% Short Length of Stay: 17.977% Long Length of Stay: 11.027%  Impression:  This 87 year old woman has stage C, asymptomatic, severe aortic stenosis and is [place] Heart Association functional class I.  She has an elevated BNP of 1500 and does report some tiredness as the day progresses which I suspect is probably early symptomatology.  She has not been as active lately due to the weather and her arthritis and may not be pushing herself to the point of having progressive symptoms.  I have personally reviewed her 2D echocardiogram, cardiac catheterization, and CTA studies.  Her echocardiogram does not visualize the aortic valve well but shows a mean gradient across the valve of 40 mmHg.  The left ventricle was hyperdynamic with complete cavity obliteration.  Cardiac catheterization shows severe two-vessel coronary disease with high-grade stenoses in the proximal to mid LAD and proximal RCA.  These lesions were felt to be amenable to PCI and stenting by Dr. Burt Knack.  I agree that aortic valve replacement and coronary revascularization should be performed in this patient to prevent myocardial infarction and progressive left ventricular deterioration.  Given her advanced age and small stature I think PCI followed by TAVR would be the best option for her.  Her gated cardiac CTA shows anatomy suitable for transcatheter valve replacement with a relatively small annular area of 325 mm.  This would be suitable for either a 20 mm Sapien 3 valve or a 26 mm Medtronic Evolut Pro + valve.  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  The patient and her husband were counseled at length regarding treatment alternatives for management of severe symptomatic aortic stenosis. The risks and benefits of  surgical intervention has been discussed in detail. Long-term prognosis with medical therapy was discussed. Alternative approaches such as conventional surgical aortic valve replacement, transcatheter aortic valve replacement, and palliative medical therapy were compared and contrasted at length. This discussion was placed in the context of the patient's own specific clinical presentation and past medical history. All of their questions have been addressed.   Following the decision to proceed with transcatheter aortic valve replacement, a discussion was held regarding what types of management strategies would be attempted intraoperatively in the event of life-threatening complications, including whether or not the patient would be considered a candidate for the use of cardiopulmonary bypass and/or conversion to open sternotomy for attempted surgical intervention.  I think she would be a candidate for emergent sternotomy if needed to manage any intraoperative complications.  The patient is aware of the fact that transient use of cardiopulmonary bypass may be necessary. The patient has been advised of a variety of complications that might develop including but not limited to risks of death, stroke, paravalvular leak, aortic dissection or other major vascular complications, aortic annulus rupture, device embolization, cardiac rupture or perforation, mitral regurgitation, acute myocardial infarction, arrhythmia, heart block or bradycardia requiring permanent pacemaker placement, congestive heart failure, respiratory failure, renal failure, pneumonia, infection, other late complications related to structural valve deterioration or migration, or other complications that might ultimately cause a temporary or permanent loss of functional independence or other long term morbidity. The patient provides full informed consent for the procedure as described and all questions were answered.    Plan:  She will be scheduled  for PCI in the near future followed by staged transfemoral TAVR.  I will discuss her with our multidisciplinary heart valve team to decide which valve type to use.  The penetrating ulcer in her abdominal aorta looks like it is suitable to treat with EVAR and we will review  this with vascular surgery preoperatively.  I spent 60 minutes performing this consultation and > 50% of this time was spent face to face counseling and coordinating the care of this patient's severe aortic stenosis.    Gaye Pollack, MD 09/08/2020

## 2020-09-08 NOTE — Progress Notes (Signed)
Patient ID: Renee Figueroa, female   DOB: September 04, 1933, 87 y.o.   MRN: [id number]  [facility] CONSULTATION REPORT  Referring Provider is Binnie Rail, MD Primary Cardiologist is No primary care provider on file. PCP is Burns, Claudina Lick, MD  Chief Complaint  Patient presents with  . Aortic Stenosis    New patient consultation, review all studies  . New Patient (Initial Visit)    HPI:  The patient is an 87 year old woman with a history of hypertension, hyperlipidemia, osteoporosis and arthritis, and a long history of a heart murmur.  She was noted to have a louder heart murmur by her PCP and underwent 2D echocardiogram on 05/20/2020.  This showed a mean gradient across aortic valve of 40 mmHg with a peak gradient of 61 mmHg.  Aortic valve area was 0.9 cm.  Left ventricular ejection fraction was 70 to 75% with hyperdynamic function and mild LVH with grade 1 diastolic dysfunction.  There was felt to be severe mid cavitary obstruction with a mid cavitary gradient of 90 mmHg.  She was evaluated by Dr. Burt Knack and was found to have a BNP of 1512.  She underwent cardiac catheterization on 08/26/2020 showing severe two-vessel coronary disease with 90% proximal and 80% mid LAD stenosis as well as 80% proximal RCA stenosis.  The mean gradient across the aortic valve was measured at 53 mmHg with a peak to peak gradient of 79 mmHg.  Calculated aortic valve area was 0.48 cm  The patient is here today with her husband she reports maintaining a fair level of activity taking care of her house and volunteering at her church.  She does some walking but not as much as she has in the past which she says is due to her arthritis.  She denies any exertional shortness of breath.  She does get tired but said that she is up at 7 in the morning at about 3 in the afternoon has to take a nap before she keeps going.  She denies any chest pain or pressure.   She has had no dizziness or syncope.  She denies orthopnea PND.  She denies peripheral edema.  Past Medical History:  Diagnosis Date  . Allergy    seasonal  . Arthritis    mild  . Basal cell carcinoma of anterior chest    skin  . Colon polyps    adenomatous  . Diverticulosis   . Heart murmur   . Herpes zoster 04/2011   C 6  LUE  . Hyperlipidemia   . Hypertension   . Osteoporosis   . Renal calculi 1962    X 1     Past Surgical History:  Procedure Laterality Date  . colonoscopy with polypectomy  2004    adenomatous; Dr Henrene Pastor. Neg 2013  . DILATION AND CURETTAGE OF UTERUS     age 31  . G 2 P 2    . RIGHT/LEFT HEART CATH AND CORONARY ANGIOGRAPHY N/A 08/26/2020   Procedure: RIGHT/LEFT HEART CATH AND CORONARY ANGIOGRAPHY;  Surgeon: Sherren Mocha, MD;  Location: [facility];  Service: Cardiovascular;  Laterality: N/A;  . TONSILLECTOMY AND ADENOIDECTOMY      Family History  Problem Relation Age of Onset  . Hypertension Mother   . Stomach cancer Mother        died @ 39  . Heart attack Father 55  . Colon cancer Neg Hx   . Colon  polyps Neg Hx   . Rectal cancer Neg Hx   . Diabetes Neg Hx   . Stroke Neg Hx     Social History   Socioeconomic History  . Marital status: Married    Spouse name: Not on file  . Number of children: 2  . Years of education: Not on file  . Highest education level: Not on file  Occupational History  . Not on file  Tobacco Use  . Smoking status: Never Smoker  . Smokeless tobacco: Never Used  Vaping Use  . Vaping Use: Never used  Substance and Sexual Activity  . Alcohol use: Yes    Comment: maybe 1 drink a month  . Drug use: No  . Sexual activity: Not Currently  Other Topics Concern  . Not on file  Social History Narrative  . Not on file   Social Determinants of Health   Financial Resource Strain: Not on file  Food Insecurity: Not on file  Transportation Needs: Not on file  Physical Activity: Not on file  Stress: Not on  file  Social Connections: Not on file  Intimate Partner Violence: Not on file    Current Outpatient Medications  Medication Sig Dispense Refill  . aspirin EC 81 MG tablet Take 1 tablet (81 mg total) by mouth daily. Swallow whole. 150 tablet 2  . carboxymethylcellulose (REFRESH PLUS) 0.5 % SOLN Place 1 drop into both eyes 3 (three) times daily as needed (dry eyes).    . Cholecalciferol (VITAMIN D) 50 MCG (2000 UT) CAPS Take 2,000 Units by mouth daily.    . fluticasone (FLONASE) 50 MCG/ACT nasal spray Place 1 spray into both nostrils daily as needed for allergies or rhinitis.    Marland Kitchen losartan (COZAAR) 50 MG tablet Take 1 tablet (50 mg total) by mouth daily. 90 tablet 3  . rosuvastatin (CRESTOR) 10 MG tablet Take 1 tablet (10 mg total) by mouth daily. 90 tablet 3   No current facility-administered medications for this visit.    No Known Allergies    Review of Systems:   General:  normal appetite, + decreased energy, no weight gain, no weight loss, no fever  Cardiac:  no chest pain with exertion, no chest pain at rest, noSOB with  exertion, no resting SOB, no PND, no orthopnea, no palpitations, no arrhythmia, no atrial fibrillation, no LE edema, no dizzy spells, no syncope  Respiratory:  no shortness of breath, no home oxygen, no productive cough, no dry cough, no bronchitis, no wheezing, no hemoptysis, no asthma, no pain with inspiration or cough, no sleep apnea, no CPAP at night  GI:   no difficulty swallowing, no reflux, no frequent heartburn, no hiatal hernia, no abdominal pain, no constipation, no diarrhea, no hematochezia, no hematemesis, no melena  GU:   no dysuria,  no frequency, no urinary tract infection, no hematuria, no kidney stones, no kidney disease  Vascular:  no pain suggestive of claudication, no pain in feet, no leg cramps, no varicose veins, no DVT, no non-healing foot ulcer  Neuro:   no stroke, no TIA's, no seizures, no headaches, no temporary blindness one eye,  no slurred  speech, no peripheral neuropathy, no chronic pain, no instability of gait, no memory/cognitive dysfunction  Musculoskeletal: + arthritis, no joint swelling, no myalgias, no difficulty walking, normal mobility   Skin:   no rash, no itching, no skin infections, no pressure sores or ulcerations  Psych:   no anxiety, no depression, no nervousness, no unusual recent stress  Eyes:   no blurry vision, no floaters, no recent vision changes, no glasses or contacts  ENT:   no hearing loss, no loose or painful teeth, no dentures, last saw dentist 08/2020  Hematologic:  no easy bruising, no abnormal bleeding, no clotting disorder, no frequent epistaxis  Endocrine:  no diabetes, does not check CBG's at home     Physical Exam:   BP (!) 211/102 (BP Location: Left Arm, Patient Position: Sitting, Cuff Size: Small)   Pulse 90   Resp 20   Ht 4\' 11"  (1.499 m)   Wt 99 lb (44.9 kg)   SpO2 97% Comment: RA  BMI 20.00 kg/m   General:  Thin, small frame woman, well-appearing  HEENT:  Unremarkable, NCAT, PERLA, EOMI  Neck:   no JVD, no bruits, no adenopathy   Chest:   clear to auscultation, symmetrical breath sounds, no wheezes, no rhonchi   CV:   RRR, grade lll/VI crescendo/decrescendo murmur heard best at RSB,  no diastolic murmur  Abdomen:  soft, non-tender, no masses   Extremities:  warm, well-perfused, pulses palpable at ankles, no LE edema  Rectal/GU  Deferred  Neuro:   Grossly non-focal and symmetrical throughout  Skin:   Clean and dry, no rashes, no breakdown   Diagnostic Tests:  ECHOCARDIOGRAM REPORT       Patient Name:  Renee Figueroa Date of Exam: 05/20/2020  Medical Rec #: [id number]    Height:    59.0 in  Accession #:  [id number]   Weight:    105.4 lb  Date of Birth: [birth year]    BSA:     1.405 m  Patient Age:  83 years    BP:      158/80 mmHg  Patient Gender: F        HR:      104 bpm.  Exam Location: [address]   Procedure: 2D  Echo, Cardiac Doppler and Color Doppler   Indications:  R01.1 Murmur    History:    Patient has no prior history of Echocardiogram  examinations.         Risk Factors:Hypertension and Dyslipidemia. Hyperglycemia.         Degerative joint disease.    Sonographer:  Diamond Nickel RCS  Referring Phys: [id number] [place]    1. Left ventricular ejection fraction, by estimation, is 70 to 75%. The  left ventricle has hyperdynamic function. The left ventricle has no  regional wall motion abnormalities. There is mild left ventricular  hypertrophy. Left ventricular diastolic  parameters are consistent with Grade I diastolic dysfunction (impaired  relaxation).  2. Severe mid-cavitary obstruction, mid-cavitary gradient in the left  ventricle with maximum instantaneous gradient 90 mmHg, Vmax 4.7 m/s.  3. Right ventricular systolic function is normal. The right ventricular  size is normal. Tricuspid regurgitation signal is inadequate for assessing  PA pressure.  4. The mitral valve is normal in structure. Trivial mitral valve  regurgitation. No evidence of mitral stenosis.  5. The aortic valve was not well visualized. There is moderate  calcification of the aortic valve. Aortic valve regurgitation is trivial.  Severe aortic valve stenosis. Aortic valve mean gradient measures 40.0  mmHg.  6. The inferior vena cava is normal in size with greater than 50%  respiratory variability, suggesting right atrial pressure of 3 mmHg.  7. Increased flow velocities may be secondary to anemia, thyrotoxicosis,  hyperdynamic or high flow state.   Conclusion(s)/Recommendation(s): Consider alternate imaging to better  assess aortic valve structure and function in setting of hyperdynamic flow  and possible severe aortic stenosis vs. high flow state.   FINDINGS  Left Ventricle: Left ventricular ejection fraction, by estimation, is 70  to 75%. The left  ventricle has hyperdynamic function. The left ventricle  has no regional wall motion abnormalities. The left ventricular internal  cavity size was normal in size.  There is mild left ventricular hypertrophy. Left ventricular diastolic  parameters are consistent with Grade I diastolic dysfunction (impaired  relaxation).   Right Ventricle: The right ventricular size is normal. No increase in  right ventricular wall thickness. Right ventricular systolic function is  normal. Tricuspid regurgitation signal is inadequate for assessing PA  pressure.   Left Atrium: Left atrial size was normal in size.   Right Atrium: Right atrial size was normal in size.   Pericardium: There is no evidence of pericardial effusion.   Mitral Valve: The mitral valve is normal in structure. Mild mitral annular  calcification. Trivial mitral valve regurgitation. No evidence of mitral  valve stenosis.   Tricuspid Valve: The tricuspid valve is normal in structure. Tricuspid  valve regurgitation is not demonstrated. No evidence of tricuspid  stenosis.   Aortic Valve: The aortic valve was not well visualized. There is moderate  calcification of the aortic valve. Aortic valve regurgitation is trivial.  Severe aortic stenosis is present. Aortic valve mean gradient measures  40.0 mmHg. Aortic valve peak  gradient measures 60.8 mmHg. Aortic valve area, by VTI measures 0.90 cm.   Pulmonic Valve: The pulmonic valve was not well visualized. Pulmonic valve  regurgitation is trivial. No evidence of pulmonic stenosis.   Aorta: The aortic root is normal in size and structure.   Venous: The inferior vena cava is normal in size with greater than 50%  respiratory variability, suggesting right atrial pressure of 3 mmHg.   IAS/Shunts: No atrial level shunt detected by color flow Doppler.     LEFT VENTRICLE  PLAX 2D  LVIDd:     2.30 cm Diastology  LVIDs:     1.30 cm LV e' medial:  4.90 cm/s  LV PW:      1.10 cm LV E/e' medial: 14.5  LV IVS:    1.20 cm LV e' lateral:  5.77 cm/s  LVOT diam:   1.70 cm LV E/e' lateral: 12.3  LV SV:     63  LV SV Index:  45  LVOT Area:   2.27 cm     RIGHT VENTRICLE  RV Basal diam: 1.70 cm  RV S prime:   20.80 cm/s  TAPSE (M-mode): 1.9 cm   LEFT ATRIUM       Index    RIGHT ATRIUM     Index  LA diam:    3.10 cm 2.21 cm/m RA Area:   6.95 cm  LA Vol (A2C):  37.6 ml 26.77 ml/m RA Volume:  11.80 ml 8.40 ml/m  LA Vol (A4C):  23.2 ml 16.51 ml/m  LA Biplane Vol: 30.3 ml 21.57 ml/m  AORTIC VALVE  AV Area (Vmax):  0.84 cm  AV Area (Vmean):  0.88 cm  AV Area (VTI):   0.90 cm  AV Vmax:      390.00 cm/s  AV Vmean:     277.500 cm/s  AV VTI:      0.702 m  AV Peak Grad:   60.8 mmHg  AV Mean Grad:   40.0 mmHg  LVOT Vmax:     144.00 cm/s  LVOT  Vmean:    107.000 cm/s  LVOT VTI:     0.278 m  LVOT/AV VTI ratio: 0.40    AORTA  Ao Root diam: 2.40 cm   MITRAL VALVE  MV Area (PHT): 2.45 cm   SHUNTS  MV Decel Time: 310 msec   Systemic VTI: 0.28 m  MV E velocity: 71.00 cm/s  Systemic Diam: 1.70 cm  MV A velocity: 149.00 cm/s  MV E/A ratio: 0.48   Cherlynn Kaiser MD  Electronically signed by Cherlynn Kaiser MD  Signature Date/Time: 05/21/2020/7:20:32 AM     Physicians  Panel Physicians Referring Physician Case Authorizing Physician  Sherren Mocha, MD (Primary)      Procedures  RIGHT/LEFT HEART CATH AND CORONARY ANGIOGRAPHY   Conclusion    Prox RCA lesion is 80% stenosed.  Prox LAD lesion is 90% stenosed.  Mid LAD-1 lesion is 80% stenosed.  Mid LAD-2 lesion is 50% stenosed.  There is severe aortic valve stenosis.  LV end diastolic pressure is normal.   1. Severe 2 vessel CAD involving segmental severe calcific stenosis in the proximal and mid-LAD, and severe focal eccentric stenosis of the proximal RCA 2. Severe aortic stenosis with  mean transaortic gradient 53 mmHg and peak to peak gradient 79 mmHg, calculated 0.48  Plan: formal surgical consultation as part of multidisciplinary approach to her care. Options include PCI/TAVR versus surgical AVR/CABG.    Indications  Severe aortic stenosis [I35.0 (ICD-10-CM)]   Procedural Details  Technical Details INDICATION: Severe aortic stenosis, elevated BNP  PROCEDURAL DETAILS: There was an indwelling IV in a right antecubital vein. Using normal sterile technique, the IV was changed out for a 5 Fr brachial sheath over a 0.018 inch wire. The right wrist was then prepped, draped, and anesthetized with 1% lidocaine. Using the modified Seldinger technique a 5/6 French Slender sheath was placed in the right radial artery. Intra-arterial verapamil was administered through the radial artery sheath. IV heparin was administered after a JR4 catheter was advanced into the central aorta. A Swan-Ganz catheter was used for the right heart catheterization. Standard protocol was followed for recording of right heart pressures and sampling of oxygen saturations. Fick cardiac output was calculated. Standard Judkins catheters were used for selective coronary angiography. LV pressure is recorded and an aortic valve pullback is performed. An AL-1 catheter is used to direct a straight tip wire across the aortic valve. There were no immediate procedural complications. The patient was transferred to the post catheterization recovery area for further monitoring.    Estimated blood loss <50 mL.   During this procedure medications were administered to achieve and maintain moderate conscious sedation while the patient's heart rate, blood pressure, and oxygen saturation were continuously monitored and I was present face-to-face 100% of this time.   Medications (Filter: Administrations occurring from 1444 to 1550 on 08/26/20) (important) Continuous medications are totaled by the amount administered until  08/26/20 1550.    Heparin (Porcine) in NaCl 1000-0.9 UT/500ML-% SOLN (mL) Total volume:  1,000 mL  Date/Time Rate/Dose/Volume Action   08/26/20 1455 500 mL Given   1455 500 mL Given    fentaNYL (SUBLIMAZE) injection (mcg) Total dose:  25 mcg  Date/Time Rate/Dose/Volume Action   08/26/20 1507 25 mcg Given    midazolam (VERSED) injection (mg) Total dose:  2 mg  Date/Time Rate/Dose/Volume Action   08/26/20 1507 2 mg Given    lidocaine (PF) (XYLOCAINE) 1 % injection (mL) Total volume:  5 mL  Date/Time Rate/Dose/Volume Action   08/26/20  1515 2 mL Given   1520 3 mL Given    Radial Cocktail/Verapamil only (mL) Total volume:  10 mL  Date/Time Rate/Dose/Volume Action   08/26/20 1522 10 mL Given    heparin sodium (porcine) injection (Units) Total dose:  2,500 Units  Date/Time Rate/Dose/Volume Action   08/26/20 1527 2,500 Units Given    iohexol (OMNIPAQUE) 350 MG/ML injection (mL) Total volume:  55 mL  Date/Time Rate/Dose/Volume Action   08/26/20 1541 55 mL Given    Sedation Time  Sedation Time Physician-1: 33 minutes 58 seconds   Contrast  Medication Name Total Dose  iohexol (OMNIPAQUE) 350 MG/ML injection 55 mL    Radiation/Fluoro  Fluoro time: 5.1 (min) DAP: 5534 (mGycm2) Cumulative Air Kerma: 103 (mGy)   Coronary Findings   Diagnostic Dominance: Right  Left Main  The vessel exhibits minimal luminal irregularities.  Left Anterior Descending  Prox LAD lesion is 90% stenosed. The lesion is severely calcified.  Mid LAD-1 lesion is 80% stenosed.  Mid LAD-2 lesion is 50% stenosed. The lesion is calcified.  Left Circumflex  There is mild diffuse disease throughout the vessel.  Right Coronary Artery  Prox RCA lesion is 80% stenosed. The lesion is eccentric.   Intervention   No interventions have been documented.  Right Heart  Right Heart Pressures LV EDP is normal.   Left Heart  Aortic Valve There is severe aortic valve stenosis. The  aortic valve is calcified. There is restricted aortic valve motion. Mean gradient 53 mmHg, peak to peak gradient 79 mmHg, and calculated AVA 0.48   Coronary Diagrams   Diagnostic Dominance: Right    Intervention    Implants    No implant documentation for this case.   Syngo Images  Show images for CARDIAC CATHETERIZATION  Images on Long Term Storage  Show images for Shya, Younge to Procedure Log  Procedure Log     Hemo Data  Flowsheet Row Most Recent Value  Fick Cardiac Output 4.05 L/min  Fick Cardiac Output Index 2.93 (L/min)/BSA  Aortic Mean Gradient 53.1 mmHg  Aortic Peak Gradient 79 mmHg  Aortic Valve Area 0.48  Aortic Value Area Index 0.35 cm2/BSA  RA A Wave 1 mmHg  RA V Wave 0 mmHg  RA Mean 0 mmHg  RV Systolic Pressure 24 mmHg  RV Diastolic Pressure 0 mmHg  RV EDP 1 mmHg  PA Systolic Pressure 16 mmHg  PA Diastolic Pressure 4 mmHg  PA Mean 8 mmHg  PW A Wave 3 mmHg  PW V Wave 3 mmHg  PW Mean 3 mmHg  AO Systolic Pressure [id number] mmHg  AO Diastolic Pressure 65 mmHg  AO Mean 95 mmHg  LV Systolic Pressure 123456 mmHg  LV Diastolic Pressure 7 mmHg  LV EDP 10 mmHg  AOp Systolic Pressure [id number] mmHg  AOp Diastolic Pressure 61 mmHg  AOp Mean Pressure 97 mmHg  LVp Systolic Pressure [id number] mmHg  LVp Diastolic Pressure 11 mmHg  LVp EDP Pressure 19 mmHg  QP/QS 1  TPVR Index 2.73 HRUI  TSVR Index 32.41 HRUI  TPVR/TSVR Ratio 0.08    ADDENDUM REPORT: 07/02/2020 22:37  CLINICAL DATA:  39 -year-old female with severe aortic stenosis being evaluated for a TAVR procedure.  EXAM: Cardiac TAVR CT  TECHNIQUE: The patient was scanned on a [employer]. A 120 kV retrospective scan was triggered in the descending thoracic aorta at 111 HU's. Gantry rotation speed was 250 msecs and collimation was .6 mm. No beta blockade or nitro  were given. The 3D data set was reconstructed in 5% intervals of the R-R cycle. Systolic and diastolic phases were  analyzed on a dedicated work station using MPR, MIP and VRT modes. The patient received 80 cc of contrast.  FINDINGS: Aortic Root:  Aortic valve:trileaflet  Aortic valve calcium score: 1647  Aortic annulus:  Diameter: 22mm x 36mm  Perimeter: 18mm  Area: 325 cm^2  Calcifications: Mild calcification adjacent to left coronary cusp  Coronary height: Min Left - 27mm, Max Left - 50mm; Min Right - 29mm  Sinotubular height: Left cusp - 44mm; Right cusp - 55mm; Noncoronary cusp - 2mm  LVOT (as measured 3 mm below the annulus):  Diameter: 64mm x 50mm  Area: 318 mm^2  Calcifications: No calcifications  Aortic sinus width: Left cusp - 16mm; Right cusp - 25mm; Noncoronary cusp - 40mm  Sinotubular junction width: 48mm x 74mm  Optimum Fluoroscopic Angle for Delivery: LAO 27 CAU 3  Cardiac:  Right atrium: Normal size  Right ventricle: Normal size  Pulmonary arteries: Normal size  Pulmonary veins: Normal configuration  Left atrium: Mild enlargement  Left ventricle: Normal size  Pericardium: Normal thickness  Coronary arteries: Calcium score 476. Proximal to mid LAD heavily calcified, unable to rule out obstructive CAD  IMPRESSION: 1. Trileaflet aortic valve with severe calcifications (calcium score 1647)  2. Aortic annulus measures 26mm x 59mm with area 325 mm^2 and perimeter 57mm. Mild annular calcification adjacent to left coronary cusp. Annular measurements suitable for delivery or 57mm Evolut valve.  3. Sufficient coronary to annulus distance, measuring 14mm to left main and 15mm to RCA  4. Optimum Fluoroscopic Angle for Delivery:  LAO 27 CAU 3  5. Coronary calcium score 476 (74th percentile). Proximal to mid LAD heavily calcified, unable to rule out obstructive CAD   Electronically Signed   By: Oswaldo Milian MD   On: 07/02/2020 22:37   Addended by Donato Heinz, MD on 07/02/2020 10:39 PM     Study Result  Narrative & Impression  EXAM: OVER-READ INTERPRETATION  CT CHEST  The following report is an over-read performed by radiologist Dr. Vinnie Langton of [facility], [place] on 06/30/2020. This over-read does not include interpretation of cardiac or coronary anatomy or pathology. The coronary calcium score/coronary CTA interpretation by the cardiologist is attached.  COMPARISON:  Chest CT 01/13/2004.  FINDINGS: Aortic atherosclerosis. Within the visualized portions of the thorax there are no suspicious appearing pulmonary nodules or masses, there is no acute consolidative airspace disease, no pleural effusions, no pneumothorax and no lymphadenopathy. Visualized portions of the upper abdomen are unremarkable. There are no aggressive appearing lytic or blastic lesions noted in the visualized portions of the skeleton.  IMPRESSION: 1.  Aortic Atherosclerosis (ICD10-I70.0).  Electronically Signed: By: Vinnie Langton M.D. On: 06/30/2020 13:33    Narrative & Impression  CLINICAL DATA:  87 year old female with history of severe aortic stenosis. Preprocedural study prior to potential transcatheter aortic valve replacement (TAVR) procedure.  EXAM: CT ANGIOGRAPHY CHEST, ABDOMEN AND PELVIS  TECHNIQUE: Multidetector CT imaging through the chest, abdomen and pelvis was performed using the standard protocol during bolus administration of intravenous contrast. Multiplanar reconstructed images and MIPs were obtained and reviewed to evaluate the vascular anatomy.  CONTRAST:  90mL OMNIPAQUE IOHEXOL 350 MG/ML SOLN  COMPARISON:  Cardiac CTA 06/30/2020.  FINDINGS: CTA CHEST FINDINGS  Cardiovascular: Heart size is normal. There is no significant pericardial fluid, thickening or pericardial calcification. There is aortic atherosclerosis, as well as atherosclerosis of the  great vessels of the mediastinum and the coronary arteries, including calcified  atherosclerotic plaque in the left anterior descending coronary artery. Severe thickening calcification of the aortic valve.  Mediastinum/Lymph Nodes: No pathologically enlarged mediastinal or hilar lymph nodes. Esophagus is unremarkable in appearance. No axillary lymphadenopathy.  Lungs/Pleura: No suspicious appearing pulmonary nodules or masses are noted. No acute consolidative airspace disease. No pleural effusions.  Musculoskeletal/Soft Tissues: There are no aggressive appearing lytic or blastic lesions noted in the visualized portions of the skeleton.  CTA ABDOMEN AND PELVIS FINDINGS  Hepatobiliary: No suspicious cystic or solid hepatic lesions. No intra or extrahepatic biliary ductal dilatation. Gallbladder is normal in appearance.  Pancreas: No pancreatic mass. No pancreatic ductal dilatation. No pancreatic or peripancreatic fluid collections or inflammatory changes.  Spleen: Unremarkable.  Adrenals/Urinary Tract: Bilateral kidneys and adrenal glands are normal in appearance. No hydroureteronephrosis. Urinary bladder is unremarkable in appearance.  Stomach/Bowel: Normal appearance of the stomach. No pathologic dilatation of small bowel or colon. Numerous colonic diverticulae are noted, particularly in the sigmoid colon, without surrounding inflammatory changes to suggest an acute diverticulitis at this time. The appendix is not confidently identified and may be surgically absent. Regardless, there are no inflammatory changes noted adjacent to the cecum to suggest the presence of an acute appendicitis at this time.  Vascular/Lymphatic: Aortic atherosclerosis with penetrating ulcer measuring up to 8 mm in the infrarenal abdominal aorta (axial image 164 of series 5). Vascular findings and measurements pertinent to potential TAVR procedure, as detailed below. No lymphadenopathy noted in the abdomen or pelvis.  Reproductive: Uterus and ovaries are a  trophic.  Other: No significant volume of ascites.  No pneumoperitoneum.  Musculoskeletal: Grade 2 anterolisthesis of L4 upon L5. There are no aggressive appearing lytic or blastic lesions noted in the visualized portions of the skeleton.  VASCULAR MEASUREMENTS PERTINENT TO TAVR:  AORTA:  Minimal Aortic Diameter-13 x 12 mm  Severity of Aortic Calcification-mild  RIGHT PELVIS:  Right Common Iliac Artery -  Minimal Diameter-7.8 x 8.1 mm  Tortuosity-mild to moderate  Calcification-none  Right External Iliac Artery -  Minimal Diameter-6.3 x 5.9 mm  Tortuosity-extreme  Calcification-none  Right Common Femoral Artery -  Minimal Diameter-6.3 x 6.1 mm  Tortuosity-mild  Calcification-none  LEFT PELVIS:  Left Common Iliac Artery -  Minimal Diameter-7.3 x 7.6 mm  Tortuosity-mild-to-moderate  Calcification-mild  Left External Iliac Artery -  Minimal Diameter-5.6 x 5.6 mm  Tortuosity-extreme  Calcification-none  Left Common Femoral Artery -  Minimal Diameter-6.5 x 5.9 mm  Tortuosity-mild  Calcification-none  Review of the MIP images confirms the above findings.  IMPRESSION: 1. Vascular findings and measurements pertinent to potential TAVR procedure, as detailed above. 2. Severe thickening calcification of the aortic valve, compatible with the reported clinical history of severe aortic stenosis. 3. Aortic atherosclerosis, in addition to left anterior descending coronary artery disease. 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 4. Additional incidental findings, as above.   Electronically Signed   By: Vinnie Langton M.D.   On: 09/02/2020 15:01    [facility] Adult Cardiac Surgery Database Version 4.20 RISK SCORES Procedure: AVR + CAB Risk of Mortality: 4.597% Renal Failure: 1.797% Permanent Stroke: 2.516% Prolonged Ventilation: 15.009% DSW  Infection: 0.095% Reoperation: 5.891% Morbidity or Mortality: 22.921% Short Length of Stay: 17.977% Long Length of Stay: 11.027%  Impression:  This 87 year old woman has stage C, asymptomatic, severe aortic stenosis and is New [place] Heart Association functional class I.  She has an elevated BNP of 1500 and does report some tiredness as the day progresses which I suspect is probably early symptomatology.  She has not been as active lately due to the weather and her arthritis and may not be pushing herself to the point of having progressive symptoms.  I have personally reviewed her 2D echocardiogram, cardiac catheterization, and CTA studies.  Her echocardiogram does not visualize the aortic valve well but shows a mean gradient across the valve of 40 mmHg.  The left ventricle was hyperdynamic with complete cavity obliteration.  Cardiac catheterization shows severe two-vessel coronary disease with high-grade stenoses in the proximal to mid LAD and proximal RCA.  These lesions were felt to be amenable to PCI and stenting by Dr. Burt Knack.  I agree that aortic valve replacement and coronary revascularization should be performed in this patient to prevent myocardial infarction and progressive left ventricular deterioration.  Given her advanced age and small stature I think PCI followed by TAVR would be the best option for her.  Her gated cardiac CTA shows anatomy suitable for transcatheter valve replacement with a relatively small annular area of 325 mm.  This would be suitable for either a 20 mm Sapien 3 valve or a 26 mm Medtronic Evolut Pro + valve.  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  The patient and her husband were counseled at length regarding treatment alternatives for management of severe symptomatic aortic stenosis. The risks and benefits of  surgical intervention has been discussed in detail. Long-term prognosis with medical therapy was discussed. Alternative approaches such as conventional surgical aortic valve replacement, transcatheter aortic valve replacement, and palliative medical therapy were compared and contrasted at length. This discussion was placed in the context of the patient's own specific clinical presentation and past medical history. All of their questions have been addressed.   Following the decision to proceed with transcatheter aortic valve replacement, a discussion was held regarding what types of management strategies would be attempted intraoperatively in the event of life-threatening complications, including whether or not the patient would be considered a candidate for the use of cardiopulmonary bypass and/or conversion to open sternotomy for attempted surgical intervention.  I think she would be a candidate for emergent sternotomy if needed to manage any intraoperative complications.  The patient is aware of the fact that transient use of cardiopulmonary bypass may be necessary. The patient has been advised of a variety of complications that might develop including but not limited to risks of death, stroke, paravalvular leak, aortic dissection or other major vascular complications, aortic annulus rupture, device embolization, cardiac rupture or perforation, mitral regurgitation, acute myocardial infarction, arrhythmia, heart block or bradycardia requiring permanent pacemaker placement, congestive heart failure, respiratory failure, renal failure, pneumonia, infection, other late complications related to structural valve deterioration or migration, or other complications that might ultimately cause a temporary or permanent loss of functional independence or other long term morbidity. The patient provides full informed consent for the procedure as described and all questions were answered.    Plan:  She will be scheduled  for PCI in the near future followed by staged transfemoral TAVR.  I will discuss her with our multidisciplinary heart valve team to decide which valve type to use.  The penetrating ulcer in her abdominal aorta looks like it is suitable to treat with EVAR and we will review  this with vascular surgery preoperatively.  I spent 60 minutes performing this consultation and > 50% of this time was spent face to face counseling and coordinating the care of this patient's severe aortic stenosis.    Gaye Pollack, MD 09/08/2020

## 2020-09-14 ENCOUNTER — Ambulatory Visit: Payer: Medicare Other | Admitting: Cardiology

## 2020-09-14 ENCOUNTER — Other Ambulatory Visit: Payer: Self-pay

## 2020-09-14 DIAGNOSIS — I350 Nonrheumatic aortic (valve) stenosis: Secondary | ICD-10-CM

## 2020-09-14 MED ORDER — CLOPIDOGREL BISULFATE 75 MG PO TABS: 75.0000 mg | ORAL_TABLET | Freq: Every day | ORAL | 3 refills | Status: DC

## 2020-09-15 ENCOUNTER — Encounter: Payer: Self-pay | Admitting: Surgery

## 2020-09-15 ENCOUNTER — Other Ambulatory Visit: Payer: Self-pay

## 2020-09-15 DIAGNOSIS — I719 Aortic aneurysm of unspecified site, without rupture: Secondary | ICD-10-CM

## 2020-09-16 ENCOUNTER — Telehealth: Payer: Self-pay | Admitting: Internal Medicine

## 2020-09-16 NOTE — Telephone Encounter (Signed)
1.26.22-[employer] Benefits investigation submitted in [employer] portal

## 2020-09-25 ENCOUNTER — Ambulatory Visit: Payer: Medicare Other | Admitting: Vascular Surgery

## 2020-09-25 ENCOUNTER — Encounter: Payer: Self-pay | Admitting: Vascular Surgery

## 2020-09-25 ENCOUNTER — Other Ambulatory Visit: Payer: Self-pay

## 2020-09-25 VITALS — BP 181/92 | HR 90 | Temp 98.0°F | Resp 20 | Ht 59.0 in | Wt 100.0 lb

## 2020-09-25 DIAGNOSIS — I714 Abdominal aortic aneurysm, without rupture, unspecified: Secondary | ICD-10-CM

## 2020-09-25 NOTE — Progress Notes (Signed)
Patient ID: Renee Figueroa, female   DOB: November 05, 1933, 87 y.o.   MRN: [id number]  Reason for Consult: New Patient (Initial Visit)   Referred by Binnie Rail, MD  Subjective:     HPI:  Renee Figueroa is a 87 y.o. female without previous vascular disease recently evaluated for aortic stenosis and is planning for stenting of her LAD and proximal RCA followed by TAVR.  During work-up she was found to have penetrating aortic ulcer and abdominal aorta.  She denies any abdominal pain or back pain.  She denies any previous CT scan that evaluated her abdomen.  She has been in her usual level of activity though her husband has had pneumonia recently and has been at home.  She did not take any blood thinners.  She has no personal or family history of aneurysmal disease.  She denies stroke TIA amaurosis.  She is on Crestor and aspirin and Plavix.  Risk factors for vascular disease include hypertension and hyperlipidemia.  Past Medical History:  Diagnosis Date  . Allergy    seasonal  . Arthritis    mild  . Basal cell carcinoma of anterior chest    skin  . Colon polyps    adenomatous  . Diverticulosis   . Heart murmur   . Herpes zoster 04/2011   C 6  LUE  . Hyperlipidemia   . Hypertension   . Osteoporosis   . Renal calculi 1962    X 1    Family History  Problem Relation Age of Onset  . Hypertension Mother   . Stomach cancer Mother        died @ 36  . Heart attack Father 67  . Colon cancer Neg Hx   . Colon polyps Neg Hx   . Rectal cancer Neg Hx   . Diabetes Neg Hx   . Stroke Neg Hx    Past Surgical History:  Procedure Laterality Date  . colonoscopy with polypectomy  2004    adenomatous; Dr Henrene Pastor. Neg 2013  . DILATION AND CURETTAGE OF UTERUS     age 65  . G 2 P 2    . RIGHT/LEFT HEART CATH AND CORONARY ANGIOGRAPHY N/A 08/26/2020   Procedure: RIGHT/LEFT HEART CATH AND CORONARY ANGIOGRAPHY;  Surgeon: Sherren Mocha, MD;  Location: [facility];  Service: Cardiovascular;   Laterality: N/A;  . TONSILLECTOMY AND ADENOIDECTOMY      Short Social History:  Social History   Tobacco Use  . Smoking status: Never Smoker  . Smokeless tobacco: Never Used  Substance Use Topics  . Alcohol use: Yes    Comment: maybe 1 drink a month    No Known Allergies  Current Outpatient Medications  Medication Sig Dispense Refill  . acetaminophen (TYLENOL) 500 MG tablet Take 1,000 mg by mouth every 6 (six) hours as needed.    Marland Kitchen aspirin EC 81 MG tablet Take 1 tablet (81 mg total) by mouth daily. Swallow whole. 150 tablet 2  . carboxymethylcellulose (REFRESH PLUS) 0.5 % SOLN Place 1 drop into both eyes 3 (three) times daily as needed (dry eyes).    . clopidogrel (PLAVIX) 75 MG tablet Take 1 tablet (75 mg total) by mouth daily. 90 tablet 3  . fluticasone (FLONASE) 50 MCG/ACT nasal spray Place 1 spray into both nostrils daily as needed for allergies or rhinitis.    Marland Kitchen losartan (COZAAR) 50 MG tablet Take 1 tablet (50 mg total) by mouth daily. 90 tablet 3  .  Multiple Minerals-Vitamins (CALCIUM & VIT D3 BONE HEALTH PO) Take 2 each by mouth daily. Chewables    . Multiple Vitamins-Minerals (MULTIVITAMIN WITH MINERALS) tablet Take 2 tablets by mouth daily. Chewables    . rosuvastatin (CRESTOR) 10 MG tablet Take 1 tablet (10 mg total) by mouth daily. 90 tablet 3   No current facility-administered medications for this visit.    Review of Systems  Constitutional:  Constitutional negative. HENT: HENT negative.  Eyes: Eyes negative.  Respiratory: Respiratory negative.  Cardiovascular: Cardiovascular negative.  GI: Gastrointestinal negative.  Musculoskeletal: Musculoskeletal negative.  Skin: Skin negative.  Neurological: Neurological negative. Hematologic: Hematologic/lymphatic negative.  Psychiatric: Psychiatric negative.        Objective:  Objective   Vitals:   09/25/20 1035  BP: (!) 181/92  Pulse: 90  Resp: 20  Temp: 98 F (36.7 C)  SpO2: 95%  Weight: 100 lb (45.4 kg)   Height: 4\' 11"  (1.499 m)   Body mass index is 20.2 kg/m.  Physical Exam HENT:     Head: Normocephalic.     Nose:     Comments: Wearing a mask Eyes:     Pupils: Pupils are equal, round, and reactive to light.  Cardiovascular:     Pulses:          Radial pulses are 2+ on the right side and 2+ on the left side.       Popliteal pulses are 2+ on the right side and 3+ on the left side.       Dorsalis pedis pulses are 2+ on the right side and 2+ on the left side.  Pulmonary:     Effort: Pulmonary effort is normal.  Abdominal:     General: Abdomen is flat.     Palpations: Abdomen is soft. There is no mass.  Musculoskeletal:        General: No swelling. Normal range of motion.  Skin:    General: Skin is warm and dry.     Capillary Refill: Capillary refill takes less than 2 seconds.  Neurological:     General: No focal deficit present.     Mental Status: She is alert.  Psychiatric:        Mood and Affect: Mood normal.        Behavior: Behavior normal.        Thought Content: Thought content normal.        Judgment: Judgment normal.     Data: CTA IMPRESSION: 1. Vascular findings and measurements pertinent to potential TAVR procedure, as detailed above. 2. Severe thickening calcification of the aortic valve, compatible with the reported clinical history of severe aortic stenosis. 3. Aortic atherosclerosis, in addition to left anterior descending coronary artery disease. 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 4. Additional incidental findings, as above.     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 There are no previous scans to compare. I have recommended continued surveillance with aortic duplex in 6 months. She also has prominent left popliteal pulse which we will check in 6 months as well. She is okay  from a vascular standpoint to proceed with the above-noted operations.     Waynetta Sandy MD [facility]

## 2020-09-29 ENCOUNTER — Other Ambulatory Visit: Payer: Self-pay

## 2020-09-29 ENCOUNTER — Other Ambulatory Visit: Payer: Medicare Other

## 2020-09-29 DIAGNOSIS — I714 Abdominal aortic aneurysm, without rupture, unspecified: Secondary | ICD-10-CM

## 2020-09-29 DIAGNOSIS — I350 Nonrheumatic aortic (valve) stenosis: Secondary | ICD-10-CM

## 2020-09-29 LAB — CBC
Hematocrit: 36.6 % (ref 34.0–46.6)
Hemoglobin: 12.9 g/dL (ref 11.1–15.9)
MCH: 30.6 pg (ref 26.6–33.0)
MCHC: 35.2 g/dL (ref 31.5–35.7)
MCV: 87 fL (ref 79–97)
Platelets: 220 10*3/uL (ref 150–450)
RBC: 4.21 x10E6/uL (ref 3.77–5.28)
RDW: 11.8 % (ref 11.7–15.4)
WBC: 7.5 10*3/uL (ref 3.4–10.8)

## 2020-09-29 LAB — BASIC METABOLIC PANEL
BUN/Creatinine Ratio: 20 (ref 12–28)
BUN: 12 mg/dL (ref 8–27)
CO2: 23 mmol/L (ref 20–29)
Calcium: 9.7 mg/dL (ref 8.7–10.3)
Chloride: 100 mmol/L (ref 96–106)
Creatinine, Ser: 0.61 mg/dL (ref 0.57–1.00)
GFR calc Af Amer: 96 mL/min/{1.73_m2} (ref >59)
GFR calc non Af Amer: 84 mL/min/{1.73_m2} (ref >59)
Glucose: 97 mg/dL (ref 65–99)
Potassium: 3.9 mmol/L (ref 3.5–5.2)
Sodium: 137 mmol/L (ref 134–144)

## 2020-09-30 ENCOUNTER — Other Ambulatory Visit (HOSPITAL_COMMUNITY)
Admission: RE | Admit: 2020-09-30 | Discharge: 2020-09-30 | Disposition: A | Discharge status: Home / Self Care | Payer: Medicare Other | Source: Ambulatory Visit | Attending: Cardiovascular Disease | Admitting: Cardiovascular Disease

## 2020-09-30 DIAGNOSIS — Z01812 Encounter for preprocedural laboratory examination: Secondary | ICD-10-CM | POA: Diagnosis not present

## 2020-09-30 DIAGNOSIS — Z20822 Contact with and (suspected) exposure to covid-19: Secondary | ICD-10-CM | POA: Insufficient documentation

## 2020-09-30 LAB — SARS CORONAVIRUS 2 (TAT 6-24 HRS): SARS Coronavirus 2: NEGATIVE

## 2020-10-01 NOTE — Progress Notes (Signed)
Spoke with patient regarding procedure instructions for tomorrow.  Arrival time os 8:30, nothing to eat or drink after midnight, someone to drive you home and care for you for 24 hours.  Ok to take morning medications except the Cozaar.

## 2020-10-02 ENCOUNTER — Ambulatory Visit (HOSPITAL_COMMUNITY)
Admission: RE | Admit: 2020-10-02 | Discharge: 2020-10-03 | Disposition: A | Discharge status: Home / Self Care | Payer: Medicare Other | Source: Ambulatory Visit | Attending: Cardiovascular Disease | Admitting: Cardiovascular Disease

## 2020-10-02 ENCOUNTER — Encounter (HOSPITAL_COMMUNITY)
Admission: RE | Disposition: A | Discharge status: Home / Self Care | Payer: Self-pay | Source: Ambulatory Visit | Attending: Cardiovascular Disease

## 2020-10-02 ENCOUNTER — Other Ambulatory Visit: Payer: Self-pay

## 2020-10-02 ENCOUNTER — Encounter (HOSPITAL_COMMUNITY): Payer: Self-pay | Admitting: Cardiovascular Disease

## 2020-10-02 DIAGNOSIS — I25118 Atherosclerotic heart disease of native coronary artery with other forms of angina pectoris: Secondary | ICD-10-CM | POA: Diagnosis not present

## 2020-10-02 DIAGNOSIS — Z955 Presence of coronary angioplasty implant and graft: Secondary | ICD-10-CM | POA: Insufficient documentation

## 2020-10-02 DIAGNOSIS — Z7982 Long term (current) use of aspirin: Secondary | ICD-10-CM | POA: Insufficient documentation

## 2020-10-02 DIAGNOSIS — Z79899 Other long term (current) drug therapy: Secondary | ICD-10-CM | POA: Insufficient documentation

## 2020-10-02 DIAGNOSIS — I350 Nonrheumatic aortic (valve) stenosis: Secondary | ICD-10-CM | POA: Insufficient documentation

## 2020-10-02 DIAGNOSIS — I2510 Atherosclerotic heart disease of native coronary artery without angina pectoris: Secondary | ICD-10-CM | POA: Diagnosis present

## 2020-10-02 HISTORY — PX: CORONARY STENT INTERVENTION: CATH118234

## 2020-10-02 HISTORY — PX: CORONARY ATHERECTOMY: CATH118238

## 2020-10-02 HISTORY — PX: INTRAVASCULAR ULTRASOUND/IVUS: CATH118244

## 2020-10-02 LAB — SURGICAL PCR SCREEN
MRSA, PCR: NEGATIVE
Staphylococcus aureus: NEGATIVE

## 2020-10-02 LAB — POCT ACTIVATED CLOTTING TIME: Activated Clotting Time: 386 seconds

## 2020-10-02 SURGERY — INTRAVASCULAR ULTRASOUND/IVUS
Anesthesia: LOCAL

## 2020-10-02 MED ORDER — LIDOCAINE HCL (PF) 1 % IJ SOLN
INTRAMUSCULAR | Status: AC
  Filled 2020-10-02: qty 30

## 2020-10-02 MED ORDER — SODIUM CHLORIDE 0.9 % IV SOLN: 250.0000 mL | INTRAVENOUS | Status: DC | PRN

## 2020-10-02 MED ORDER — ONDANSETRON HCL 4 MG/2ML IJ SOLN: 4.0000 mg | Freq: Four times a day (QID) | INTRAMUSCULAR | Status: DC | PRN

## 2020-10-02 MED ORDER — CLOPIDOGREL BISULFATE 75 MG PO TABS: 75.0000 mg | ORAL_TABLET | ORAL | Status: DC

## 2020-10-02 MED ORDER — BIVALIRUDIN TRIFLUOROACETATE 250 MG IV SOLR
INTRAVENOUS | Status: AC
  Filled 2020-10-02: qty 250

## 2020-10-02 MED ORDER — ROSUVASTATIN CALCIUM 5 MG PO TABS
10.0000 mg | ORAL_TABLET | Freq: Every day | ORAL | Status: DC
  Administered 2020-10-03: 10 mg via ORAL
  Filled 2020-10-02 (×2): qty 2

## 2020-10-02 MED ORDER — LIDOCAINE-EPINEPHRINE 1 %-1:100000 IJ SOLN
INTRAMUSCULAR | Status: DC | PRN
  Administered 2020-10-02: .5 mL via INTRADERMAL

## 2020-10-02 MED ORDER — SODIUM CHLORIDE 0.9 % IV SOLN
INTRAVENOUS | Status: DC | PRN
  Administered 2020-10-02: 1.75 mg/kg/h via INTRAVENOUS

## 2020-10-02 MED ORDER — FENTANYL CITRATE (PF) 100 MCG/2ML IJ SOLN
INTRAMUSCULAR | Status: AC
  Filled 2020-10-02: qty 2

## 2020-10-02 MED ORDER — IOHEXOL 350 MG/ML SOLN
INTRAVENOUS | Status: DC | PRN
  Administered 2020-10-02: 45 mL

## 2020-10-02 MED ORDER — LIDOCAINE-EPINEPHRINE 1 %-1:100000 IJ SOLN
INTRAMUSCULAR | Status: AC
  Filled 2020-10-02: qty 1

## 2020-10-02 MED ORDER — FLUTICASONE PROPIONATE 50 MCG/ACT NA SUSP
1.0000 | Freq: Every day | NASAL | Status: DC | PRN
  Filled 2020-10-02: qty 16

## 2020-10-02 MED ORDER — MIDAZOLAM HCL 2 MG/2ML IJ SOLN
INTRAMUSCULAR | Status: DC | PRN
  Administered 2020-10-02 (×2): 1 mg via INTRAVENOUS

## 2020-10-02 MED ORDER — ASPIRIN 81 MG PO CHEW: 81.0000 mg | CHEWABLE_TABLET | ORAL | Status: DC

## 2020-10-02 MED ORDER — SODIUM CHLORIDE 0.9 % IV SOLN
INTRAVENOUS | Status: AC | PRN
  Administered 2020-10-02: 500 mL via INTRAVENOUS

## 2020-10-02 MED ORDER — SODIUM CHLORIDE 0.9 % WEIGHT BASED INFUSION
1.0000 mL/kg/h | INTRAVENOUS | Status: AC
  Administered 2020-10-02: 1 mL/kg/h via INTRAVENOUS

## 2020-10-02 MED ORDER — DOPAMINE-DEXTROSE 3.2-5 MG/ML-% IV SOLN
INTRAVENOUS | Status: AC | PRN
  Administered 2020-10-02: 5 ug/kg/min via INTRAVENOUS

## 2020-10-02 MED ORDER — NITROGLYCERIN 1 MG/10 ML FOR IR/CATH LAB
INTRA_ARTERIAL | Status: AC
  Filled 2020-10-02: qty 10

## 2020-10-02 MED ORDER — HEPARIN (PORCINE) IN NACL 1000-0.9 UT/500ML-% IV SOLN
INTRAVENOUS | Status: AC
  Filled 2020-10-02: qty 1500

## 2020-10-02 MED ORDER — SODIUM CHLORIDE 0.9 % WEIGHT BASED INFUSION
3.0000 mL/kg/h | INTRAVENOUS | Status: DC
  Administered 2020-10-02: 3 mL/kg/h via INTRAVENOUS

## 2020-10-02 MED ORDER — BIVALIRUDIN BOLUS VIA INFUSION - CUPID
INTRAVENOUS | Status: DC | PRN
  Administered 2020-10-02: 33.675 mg via INTRAVENOUS

## 2020-10-02 MED ORDER — SODIUM CHLORIDE 0.9% FLUSH
3.0000 mL | Freq: Two times a day (BID) | INTRAVENOUS | Status: DC
  Administered 2020-10-02 – 2020-10-03 (×3): 3 mL via INTRAVENOUS

## 2020-10-02 MED ORDER — MUPIROCIN 2 % EX OINT
1.0000 "application " | TOPICAL_OINTMENT | Freq: Two times a day (BID) | CUTANEOUS | Status: DC
  Administered 2020-10-02: 1 via NASAL
  Filled 2020-10-02: qty 22

## 2020-10-02 MED ORDER — NITROGLYCERIN 1 MG/10 ML FOR IR/CATH LAB
INTRA_ARTERIAL | Status: DC | PRN
  Administered 2020-10-02: 150 ug via INTRACORONARY

## 2020-10-02 MED ORDER — DOPAMINE-DEXTROSE 3.2-5 MG/ML-% IV SOLN
INTRAVENOUS | Status: AC
  Filled 2020-10-02: qty 250

## 2020-10-02 MED ORDER — FENTANYL CITRATE (PF) 100 MCG/2ML IJ SOLN
INTRAMUSCULAR | Status: DC | PRN
  Administered 2020-10-02: 25 ug via INTRAVENOUS

## 2020-10-02 MED ORDER — SODIUM CHLORIDE 0.9% FLUSH: 3.0000 mL | INTRAVENOUS | Status: DC | PRN

## 2020-10-02 MED ORDER — LABETALOL HCL 5 MG/ML IV SOLN: 10.0000 mg | INTRAVENOUS | Status: AC | PRN

## 2020-10-02 MED ORDER — ASPIRIN EC 81 MG PO TBEC
81.0000 mg | DELAYED_RELEASE_TABLET | Freq: Every day | ORAL | Status: DC
Start: 2020-10-03 — End: 2020-10-03
  Administered 2020-10-03: 81 mg via ORAL
  Filled 2020-10-02: qty 1

## 2020-10-02 MED ORDER — SODIUM CHLORIDE 0.9% FLUSH
3.0000 mL | Freq: Two times a day (BID) | INTRAVENOUS | Status: DC
  Administered 2020-10-02 – 2020-10-03 (×2): 3 mL via INTRAVENOUS

## 2020-10-02 MED ORDER — IOHEXOL 350 MG/ML SOLN
INTRAVENOUS | Status: AC
  Filled 2020-10-02: qty 1

## 2020-10-02 MED ORDER — ACETAMINOPHEN 325 MG PO TABS: 650.0000 mg | ORAL_TABLET | ORAL | Status: DC | PRN

## 2020-10-02 MED ORDER — CLOPIDOGREL BISULFATE 75 MG PO TABS
75.0000 mg | ORAL_TABLET | Freq: Every day | ORAL | Status: DC
Start: 2020-10-03 — End: 2020-10-03
  Administered 2020-10-03: 75 mg via ORAL
  Filled 2020-10-02: qty 1

## 2020-10-02 MED ORDER — MIDAZOLAM HCL 2 MG/2ML IJ SOLN
INTRAMUSCULAR | Status: AC
  Filled 2020-10-02: qty 2

## 2020-10-02 MED ORDER — LOSARTAN POTASSIUM 50 MG PO TABS
50.0000 mg | ORAL_TABLET | Freq: Every day | ORAL | Status: DC
  Administered 2020-10-03: 50 mg via ORAL
  Filled 2020-10-02: qty 1

## 2020-10-02 MED ORDER — SODIUM CHLORIDE 0.9 % WEIGHT BASED INFUSION
1.0000 mL/kg/h | INTRAVENOUS | Status: DC
  Administered 2020-10-02: 1 mL/kg/h via INTRAVENOUS

## 2020-10-02 MED ORDER — HYDRALAZINE HCL 20 MG/ML IJ SOLN: 10.0000 mg | INTRAMUSCULAR | Status: AC | PRN

## 2020-10-02 MED ORDER — LIDOCAINE HCL (PF) 1 % IJ SOLN
INTRAMUSCULAR | Status: DC | PRN
  Administered 2020-10-02: 20 mL via INTRADERMAL

## 2020-10-02 SURGICAL SUPPLY — 22 items
BAG SNAP BAND KOVER 36X36 (MISCELLANEOUS) ×1 IMPLANT
CATH DRAGONFLY OPSTAR (CATHETERS) ×1 IMPLANT
CATH INFINITI 5FR JL4 (CATHETERS) ×1 IMPLANT
CATH LAUNCHER 6FR JR4 (CATHETERS) ×1 IMPLANT
CATH VISTA GUIDE 6FR JR4 (CATHETERS) ×1 IMPLANT
CLOSURE PERCLOSE PROSTYLE (VASCULAR PRODUCTS) ×1 IMPLANT
COVER DOME SNAP 22 D (MISCELLANEOUS) ×1 IMPLANT
ELECT DEFIB PAD ADLT CADENCE (PAD) ×1 IMPLANT
KIT ENCORE 26 ADVANTAGE (KITS) ×1 IMPLANT
KIT HEART LEFT (KITS) ×2 IMPLANT
LUBRICANT VIPERSLIDE CORONARY (MISCELLANEOUS) ×1 IMPLANT
PACK CARDIAC CATHETERIZATION (CUSTOM PROCEDURE TRAY) ×2 IMPLANT
PINNACLE LONG 6F 25CM (SHEATH) ×2
SET INTRODUCER MICROPUNCT 5F (INTRODUCER) ×1 IMPLANT
SHEATH INTRO PINNACLE 6F 25CM (SHEATH) IMPLANT
SHEATH PINNACLE 6F 10CM (SHEATH) ×1 IMPLANT
SHEATH PROBE COVER 6X72 (BAG) ×1 IMPLANT
STENT RESOLUTE ONYX 3.0X15 (Permanent Stent) ×1 IMPLANT
TRANSDUCER W/STOPCOCK (MISCELLANEOUS) ×2 IMPLANT
TUBING CIL FLEX 10 FLL-RA (TUBING) ×2 IMPLANT
WIRE COUGAR XT STRL 190CM (WIRE) ×1 IMPLANT
WIRE EMERALD 3MM-J .035X150CM (WIRE) ×1 IMPLANT

## 2020-10-02 NOTE — Progress Notes (Incomplete)
Progress Note  Renee Figueroa Name: Renee Figueroa Date of Encounter: 10/02/2020  [facility] Cardiologist: No primary care provider on file. ***  Subjective   ***  Inpatient Medications    Scheduled Meds: . [START ON 10/03/2020] aspirin EC  81 mg Oral Daily  . [START ON 10/03/2020] clopidogrel  75 mg Oral Daily  . [START ON 10/03/2020] losartan  50 mg Oral Daily  . mupirocin ointment  1 application Nasal BID  . rosuvastatin  10 mg Oral Daily  . sodium chloride flush  3 mL Intravenous Q12H  . sodium chloride flush  3 mL Intravenous Q12H   Continuous Infusions: . sodium chloride    . sodium chloride 1 mL/kg/hr (10/02/20 1351)   PRN Meds: sodium chloride, acetaminophen, fluticasone, ondansetron (ZOFRAN) IV, sodium chloride flush   Vital Signs    Vitals:   10/02/20 1500 10/02/20 1614 10/02/20 1659 10/02/20 1800  BP: (!) 140/105 114/88 120/74 126/63  Pulse: 78 75 74 73  Resp:  16    Temp:  97.8 F (36.6 C)    TempSrc:  Oral    SpO2: 94% 95% 93% 94%  Weight:      Height:        Intake/Output Summary (Last 24 hours) at 10/02/2020 1959 Last data filed at 10/02/2020 1430 Gross per 24 hour  Intake 421.25 ml  Output 200 ml  Net 221.25 ml   Last 3 Weights 10/02/2020 09/25/2020 09/08/2020  Weight (lbs) 99 lb 100 lb 99 lb  Weight (kg) 44.906 kg 45.36 kg 44.906 kg      Telemetry    *** - Personally Reviewed  ECG    *** - Personally Reviewed  Physical Exam  *** GEN: No acute distress.   Neck: No JVD Cardiac: RRR, no murmurs, rubs, or gallops.  Respiratory: Clear to auscultation bilaterally. GI: Soft, nontender, non-distended  MS: No edema; No deformity. Neuro:  Nonfocal  Psych: Normal affect   Labs    High Sensitivity Troponin:  No results for input(s): TROPONINIHS in the last 720 hours.    Chemistry Recent Labs  Lab 09/29/20 1200  NA 137  K 3.9  CL 100  CO2 23  GLUCOSE 97  BUN 12  CREATININE 0.61  CALCIUM 9.7  GFRNONAA 84  GFRAA 96      Hematology Recent Labs  Lab 09/29/20 1200  WBC 7.5  RBC 4.21  HGB 12.9  HCT 36.6  MCV 87  MCH 30.6  MCHC 35.2  RDW 11.8  PLT 220    BNPNo results for input(s): BNP, PROBNP in the last 168 hours.   DDimer No results for input(s): DDIMER in the last 168 hours.   Radiology    CARDIAC CATHETERIZATION  Result Date: 10/02/2020 1.  Successful PCI of the RCA using a 3.0 x 15 mm resolute Onyx DES, guided by OCT imaging.  Procedure complicated by prolonged hypotension following stenting likely related to the presence of very severe aortic stenosis. 2.  Severe residual proximal to mid LAD stenosis with heavy calcification.  I had initially planned to treat this with atherectomy and PCI today.  Because of the Renee Figueroa's hemodynamic instability after an uncomplicated RCA intervention, I do not think it is safe to perform atherectomy and PCI on the LAD prior to TAVR.  We will discuss with our team but I would anticipate moving forward with TAVR first. Recommendations: Overnight observation, dual antiplatelet therapy with aspirin and clopidogrel for 6 months, continued evaluation for TAVR in the near  future.   Cardiac Studies   TTE 05/20/20: IMPRESSIONS    1. Left ventricular ejection fraction, by estimation, is 70 to 75%. The  left ventricle has hyperdynamic function. The left ventricle has no  regional wall motion abnormalities. There is mild left ventricular  hypertrophy. Left ventricular diastolic  parameters are consistent with Grade I diastolic dysfunction (impaired  relaxation).  2. Severe mid-cavitary obstruction, mid-cavitary gradient in the left  ventricle with maximum instantaneous gradient 90 mmHg, Vmax 4.7 m/s.  3. Right ventricular systolic function is normal. The right ventricular  size is normal. Tricuspid regurgitation signal is inadequate for assessing  PA pressure.  4. The mitral valve is normal in structure. Trivial mitral valve  regurgitation. No evidence of  mitral stenosis.  5. The aortic valve was not well visualized. There is moderate  calcification of the aortic valve. Aortic valve regurgitation is trivial.  Severe aortic valve stenosis. Aortic valve mean gradient measures 40.0  mmHg.  6. The inferior vena cava is normal in size with greater than 50%  respiratory variability, suggesting right atrial pressure of 3 mmHg.  7. Increased flow velocities may be secondary to anemia, thyrotoxicosis,  hyperdynamic or high flow state.   Conclusion(s)/Recommendation(s): Consider alternate imaging to better  assess aortic valve structure and function in setting of hyperdynamic flow  and possible severe aortic stenosis vs. high flow state  Cath 10/02/20: 1.  Successful PCI of the RCA using a 3.0 x 15 mm resolute Onyx DES, guided by OCT imaging.  Procedure complicated by prolonged hypotension following stenting likely related to the presence of very severe aortic stenosis. 2.  Severe residual proximal to mid LAD stenosis with heavy calcification.  I had initially planned to treat this with atherectomy and PCI today.  Because of the Renee Figueroa's hemodynamic instability after an uncomplicated RCA intervention, I do not think it is safe to perform atherectomy and PCI on the LAD prior to TAVR.  We will discuss with our team but I would anticipate moving forward with TAVR first.  Recommendations: Overnight observation, dual antiplatelet therapy with aspirin and clopidogrel for 6 months, continued evaluation for TAVR in the near future.  Cath 08/26/20:  Prox RCA lesion is 80% stenosed.  Prox LAD lesion is 90% stenosed.  Mid LAD-1 lesion is 80% stenosed.  Mid LAD-2 lesion is 50% stenosed.  There is severe aortic valve stenosis.  LV end diastolic pressure is normal.   1. Severe 2 vessel CAD involving segmental severe calcific stenosis in the proximal and mid-LAD, and severe focal eccentric stenosis of the proximal RCA 2. Severe aortic stenosis with mean  transaortic gradient 53 mmHg and peak to peak gradient 79 mmHg, calculated 0.48  Plan: formal surgical consultation as part of multidisciplinary approach to her care. Options include PCI/TAVR versus surgical AVR/CABG.  Renee Figueroa     87 y.o. female ***  Assessment & Plan    ***  For questions or updates, please contact [place] Please consult [email or web address] for contact info under        Signed, Freada Bergeron, MD  10/02/2020, 7:59 PM

## 2020-10-02 NOTE — Progress Notes (Signed)
Pt leaves cath lab holding area in stable condition. Rt groin is CDR. No hematoma present rt groin. Rt dp/pt 2+.

## 2020-10-02 NOTE — Interval H&P Note (Signed)
Cath Lab Visit (complete for each Cath Lab visit)  Clinical Evaluation Leading to the Procedure:   ACS: No.  Non-ACS:    Anginal Classification: CCS II  Anti-ischemic medical therapy: No Therapy  Non-Invasive Test Results: No non-invasive testing performed  Prior CABG: No previous CABG  History and Physical Interval Note:  10/02/2020 10:20 AM  Renee Figueroa  has presented today for surgery, with the diagnosis of CAD, severe aortic stenosis.  The various methods of treatment have been discussed with the patient and family. After consideration of risks, benefits and other options for treatment, the patient has consented to  Procedure(s): CORONARY ATHERECTOMY (N/A) as a surgical intervention.  The patient's history has been reviewed, patient examined, no change in status, stable for surgery.  I have reviewed the patient's chart and labs.  Questions were answered to the patient's satisfaction.     Sherren Mocha

## 2020-10-03 DIAGNOSIS — Z955 Presence of coronary angioplasty implant and graft: Secondary | ICD-10-CM | POA: Diagnosis not present

## 2020-10-03 DIAGNOSIS — I2510 Atherosclerotic heart disease of native coronary artery without angina pectoris: Secondary | ICD-10-CM | POA: Diagnosis not present

## 2020-10-03 DIAGNOSIS — Z7982 Long term (current) use of aspirin: Secondary | ICD-10-CM | POA: Diagnosis not present

## 2020-10-03 DIAGNOSIS — I350 Nonrheumatic aortic (valve) stenosis: Secondary | ICD-10-CM | POA: Diagnosis not present

## 2020-10-03 DIAGNOSIS — Z79899 Other long term (current) drug therapy: Secondary | ICD-10-CM | POA: Diagnosis not present

## 2020-10-03 DIAGNOSIS — I25118 Atherosclerotic heart disease of native coronary artery with other forms of angina pectoris: Secondary | ICD-10-CM | POA: Diagnosis not present

## 2020-10-03 LAB — BASIC METABOLIC PANEL
Anion gap: 5 (ref 5–15)
BUN: 10 mg/dL (ref 8–23)
CO2: 27 mmol/L (ref 22–32)
Calcium: 9 mg/dL (ref 8.9–10.3)
Chloride: 105 mmol/L (ref 98–111)
Creatinine, Ser: 0.72 mg/dL (ref 0.44–1.00)
GFR, Estimated: >60 mL/min (ref >60)
Glucose, Bld: 104 mg/dL — ABNORMAL HIGH (ref 70–99)
Potassium: 4.1 mmol/L (ref 3.5–5.1)
Sodium: 137 mmol/L (ref 135–145)

## 2020-10-03 LAB — CBC
HCT: 35.1 % — ABNORMAL LOW (ref 36.0–46.0)
Hemoglobin: 12.1 g/dL (ref 12.0–15.0)
MCH: 30.9 pg (ref 26.0–34.0)
MCHC: 34.5 g/dL (ref 30.0–36.0)
MCV: 89.8 fL (ref 80.0–100.0)
Platelets: 189 10*3/uL (ref 150–400)
RBC: 3.91 MIL/uL (ref 3.87–5.11)
RDW: 12.7 % (ref 11.5–15.5)
WBC: 9.4 10*3/uL (ref 4.0–10.5)
nRBC: 0 % (ref 0.0–0.2)

## 2020-10-03 NOTE — Discharge Summary (Signed)
Discharge Summary    Patient ID: Renee Figueroa MRN: [id number]; DOB: 07-25-1934  Admit date: 10/02/2020 Discharge date: 10/03/2020  PCP:  Binnie Rail, MD   [place]  Cardiologist:  No primary care provider on file.  Advanced Practice Provider:  No care team member to display Electrophysiologist:  None  Structural Heart Clinic:  None    { Click here to update Patient Care Team and Refresh Note - MD (PCP) or APP (Team Member)  Change PCP Type for MD, Specialty for APP is either Cardiology or Clinical Cardiac Electrophysiology  :[id number]}    Discharge Diagnoses    Active Problems:   Severe aortic stenosis   CAD (coronary artery disease)    Diagnostic Studies/Procedures    PCI Procedure 10/02/2020: Conclusion  1.  Successful PCI of the RCA using a 3.0 x 15 mm resolute Onyx DES, guided by OCT imaging.  Procedure complicated by prolonged hypotension following stenting likely related to the presence of very severe aortic stenosis. 2.  Severe residual proximal to mid LAD stenosis with heavy calcification.  I had initially planned to treat this with atherectomy and PCI today.  Because of the patient's hemodynamic instability after an uncomplicated RCA intervention, I do not think it is safe to perform atherectomy and PCI on the LAD prior to TAVR.  We will discuss with our team but I would anticipate moving forward with TAVR first.  Recommendations: Overnight observation, dual antiplatelet therapy with aspirin and clopidogrel for 6 months, continued evaluation for TAVR in the near future.  Diagnostic Dominance: Right    Intervention       History of Present Illness     Renee Figueroa is a 87 y.o. female with severe aortic stenosis and high-grade multivessel coronary artery disease involving the LAD and RCA, who presented 10/02/2020 for PCI  Hospital Course     Consultants: None  The patient presents 10/02/2020 for planned two-vessel PCI.   She is an 87 year old woman with severe, symptomatic aortic stenosis and severe two-vessel coronary artery disease involving the proximal RCA and proximal to mid LAD.  Plan was to proceed with stenting of the RCA followed by atherectomy and stenting of the LAD.  The patient underwent PCI of the RCA via a right femoral approach, procedure directed by OCT, treated with a 3.0 x 15 mm resolute Onyx DES.  The patient's procedure was complicated by prolonged hypotension following stenting.  This required aggressive fluid resuscitation and use of dopamine in the cardiac catheterization lab.  She recovered after approximately 20 minutes and weaned quickly off of dopamine.  There was no stent related complication and in fact her stent was evaluated by OCT and demonstrated excellent expansion and apposition throughout.  There was no evidence of either proximal or distal edge dissection.  She clearly would not be able to tolerate atherectomy of the LAD until her aortic stenosis is treated.  It is presumed that her hypotension was related to the presence of very severe aortic stenosis.  The patient recovered uneventfully, had no arrhythmia on review of telemetry, and ambulated the following morning with cardiac rehab without symptoms.  Her blood pressure normalized and she had no symptoms of chest pain, shortness of breath, or lightheadedness.  She is examined the morning of 2/12 with no new findings on her exam.  She is medically stable for discharge.  We plan to proceed with TAVR in the near future.  Arrangements will be made by the structural heart  team.  Post PCI activity and restrictions are reviewed with the patient.  Her medication program is reviewed as well.  She understands the importance of adherence to dual antiplatelet therapy with aspirin and clopidogrel.  Did the patient have an acute coronary syndrome (MI, NSTEMI, STEMI, etc) this admission?:  No                               Did the patient have a  percutaneous coronary intervention (stent / angioplasty)?:  Yes.     Cath/PCI Registry Performance & Quality Measures: 1. Aspirin prescribed? - Yes 2. ADP Receptor Inhibitor (Plavix/Clopidogrel, Brilinta/Ticagrelor or Effient/Prasugrel) prescribed (includes medically managed patients)? - Yes 3. High Intensity Statin (Lipitor 40-80mg  or Crestor 20-40mg ) prescribed? - Yes 4. For EF <40%, was ACEI/ARB prescribed? - Yes 5. For EF <40%, Aldosterone Antagonist (Spironolactone or Eplerenone) prescribed? - Not Applicable (EF >/= 09%) 6. Cardiac Rehab Phase II ordered? - Yes       _____________  Discharge Vitals Blood pressure (!) 154/70, pulse 83, temperature 98.2 F (36.8 C), temperature source Oral, resp. rate 18, height 4\' 11"  (1.499 m), weight 44.9 kg, SpO2 96 %.  Filed Weights   10/02/20 0821  Weight: 44.9 kg   Physical exam: Alert, oriented, pleasant elderly woman in no distress GEN:No acute distress.   Neck:No JVD Cardiac:RRR, 3/6 harsh late peaking systolic murmur at the LLSB Respiratory:Clear to auscultation bilaterally. QZ:RAQT, nontender, non-distended  MS:No edema; No deformity. Right groin site clear. No hematoma or ecchymosis Neuro:Nonfocal  Psych: Normal affect   Labs & Radiologic Studies    CBC Recent Labs    10/03/20 0340  WBC 9.4  HGB 12.1  HCT 35.1*  MCV 89.8  PLT 622   Basic Metabolic Panel Recent Labs    10/03/20 0340  NA 137  K 4.1  CL 105  CO2 27  GLUCOSE 104*  BUN 10  CREATININE 0.72  CALCIUM 9.0   Liver Function Tests No results for input(s): AST, ALT, ALKPHOS, BILITOT, PROT, ALBUMIN in the last 72 hours. No results for input(s): LIPASE, AMYLASE in the last 72 hours. High Sensitivity Troponin:   No results for input(s): TROPONINIHS in the last 720 hours.  BNP Invalid input(s): POCBNP D-Dimer No results for input(s): DDIMER in the last 72 hours. Hemoglobin A1C No results for input(s): HGBA1C in the last 72 hours. Fasting  Lipid Panel No results for input(s): CHOL, HDL, LDLCALC, TRIG, CHOLHDL, LDLDIRECT in the last 72 hours. Thyroid Function Tests No results for input(s): TSH, T4TOTAL, T3FREE, THYROIDAB in the last 72 hours.  Invalid input(s): FREET3 _____________  CARDIAC CATHETERIZATION  Result Date: 10/02/2020 1.  Successful PCI of the RCA using a 3.0 x 15 mm resolute Onyx DES, guided by OCT imaging.  Procedure complicated by prolonged hypotension following stenting likely related to the presence of very severe aortic stenosis. 2.  Severe residual proximal to mid LAD stenosis with heavy calcification.  I had initially planned to treat this with atherectomy and PCI today.  Because of the patient's hemodynamic instability after an uncomplicated RCA intervention, I do not think it is safe to perform atherectomy and PCI on the LAD prior to TAVR.  We will discuss with our team but I would anticipate moving forward with TAVR first. Recommendations: Overnight observation, dual antiplatelet therapy with aspirin and clopidogrel for 6 months, continued evaluation for TAVR in the near future.  Disposition   Pt is being  discharged home today in good condition.  Follow-up Plans & Appointments     Follow-up Information    Sherren Mocha, MD Follow up.   Specialty: Cardiology Why: scheduler will call you to arrange follow up, please give [place] a call if you have not heard from our scheduler in 3 business days.  Contact information: [address] [place] [address] [phone]              Discharge Instructions    Amb Referral to Cardiac Rehabilitation   Complete by: As directed    Diagnosis: Coronary Stents   After initial evaluation and assessments completed: Virtual Based Care may be provided alone or in conjunction with Phase 2 Cardiac Rehab based on patient barriers.: Yes   Diet - low sodium heart healthy   Complete by: As directed    Discharge instructions   Complete by: As  directed    No lifting over 5 lbs for 1 week. No sexual activity for 1 week. Keep procedure site clean & dry. If you notice increased pain, swelling, bleeding or pus, call/return!  You may shower, but no soaking baths/hot tubs/pools for 1 week.   Increase activity slowly   Complete by: As directed       Discharge Medications   Allergies as of 10/03/2020   No Known Allergies     Medication List    TAKE these medications   acetaminophen 500 MG tablet Commonly known as: TYLENOL Take 1,000 mg by mouth every 6 (six) hours as needed.   aspirin EC 81 MG tablet Take 1 tablet (81 mg total) by mouth daily. Swallow whole.   CALCIUM & VIT D3 BONE HEALTH PO Take 2 each by mouth daily. Chewables   carboxymethylcellulose 0.5 % Soln Commonly known as: REFRESH PLUS Place 1 drop into both eyes 3 (three) times daily as needed (dry eyes).   clopidogrel 75 MG tablet Commonly known as: PLAVIX Take 1 tablet (75 mg total) by mouth daily.   fluticasone 50 MCG/ACT nasal spray Commonly known as: FLONASE Place 1 spray into both nostrils daily as needed for allergies or rhinitis.   losartan 50 MG tablet Commonly known as: COZAAR Take 1 tablet (50 mg total) by mouth daily.   multivitamin with minerals tablet Take 2 tablets by mouth daily. Chewables   rosuvastatin 10 MG tablet Commonly known as: Crestor Take 1 tablet (10 mg total) by mouth daily.          Outstanding Labs/Studies   None.  Follow-up to be arranged by structural heart team.  Duration of Discharge Encounter   Greater than 30 minutes including physician time.  Hilbert Corrigan, [place] 10/03/2020, 10:51 AM

## 2020-10-03 NOTE — Progress Notes (Signed)
Progress Note  Patient Name: Renee Figueroa Date of Encounter: 10/03/2020  [facility] Cardiologist: No primary care provider on file.   Subjective   Doing well this morning. Ambulated with cardiac rehab without problems. No chest pain or shortness of breath.  Inpatient Medications    Scheduled Meds: . aspirin EC  81 mg Oral Daily  . clopidogrel  75 mg Oral Daily  . losartan  50 mg Oral Daily  . rosuvastatin  10 mg Oral Daily  . sodium chloride flush  3 mL Intravenous Q12H  . sodium chloride flush  3 mL Intravenous Q12H   Continuous Infusions: . sodium chloride     PRN Meds: sodium chloride, acetaminophen, fluticasone, ondansetron (ZOFRAN) IV, sodium chloride flush   Vital Signs    Vitals:   10/03/20 0019 10/03/20 0607 10/03/20 0751 10/03/20 0933  BP: (!) 144/88 (!) 173/72 (!) 154/78 (!) 154/70  Pulse: 76 74 74 83  Resp: 16 18 18 18   Temp: 98 F (36.7 C)  98.4 F (36.9 C) 98.2 F (36.8 C)  TempSrc: Oral Oral Oral Oral  SpO2: 95% 95% 98% 96%  Weight:      Height:        Intake/Output Summary (Last 24 hours) at 10/03/2020 0951 Last data filed at 10/03/2020 0640 Gross per 24 hour  Intake 421.25 ml  Output 200 ml  Net 221.25 ml   Last 3 Weights 10/02/2020 09/25/2020 09/08/2020  Weight (lbs) 99 lb 100 lb 99 lb  Weight (kg) 44.906 kg 45.36 kg 44.906 kg      Telemetry    NSR, no significant arrhythmia - Personally Reviewed  ECG    NSR 74 bpm, nonspecific ST-T wave abnormality - Personally Reviewed  Physical Exam  Alert, oriented, pleasant elderly woman in no distress GEN: No acute distress.   Neck: No JVD Cardiac: RRR, 3/6 harsh late peaking systolic murmur at the LLSB Respiratory: Clear to auscultation bilaterally. GI: Soft, nontender, non-distended  MS: No edema; No deformity. Right groin site clear. No hematoma or ecchymosis Neuro:  Nonfocal  Psych: Normal affect   Labs    High Sensitivity Troponin:  No results for input(s): TROPONINIHS in the  last 720 hours.    Chemistry Recent Labs  Lab 09/29/20 1200 10/03/20 0340  NA 137 137  K 3.9 4.1  CL 100 105  CO2 23 27  GLUCOSE 97 104*  BUN 12 10  CREATININE 0.61 0.72  CALCIUM 9.7 9.0  GFRNONAA 84 >60  GFRAA 96  --   ANIONGAP  --  5     Hematology Recent Labs  Lab 09/29/20 1200 10/03/20 0340  WBC 7.5 9.4  RBC 4.21 3.91  HGB 12.9 12.1  HCT 36.6 35.1*  MCV 87 89.8  MCH 30.6 30.9  MCHC 35.2 34.5  RDW 11.8 12.7  PLT 220 189    BNPNo results for input(s): BNP, PROBNP in the last 168 hours.   DDimer No results for input(s): DDIMER in the last 168 hours.   Radiology    CARDIAC CATHETERIZATION  Result Date: 10/02/2020 1.  Successful PCI of the RCA using a 3.0 x 15 mm resolute Onyx DES, guided by OCT imaging.  Procedure complicated by prolonged hypotension following stenting likely related to the presence of very severe aortic stenosis. 2.  Severe residual proximal to mid LAD stenosis with heavy calcification.  I had initially planned to treat this with atherectomy and PCI today.  Because of the patient's hemodynamic instability after an  uncomplicated RCA intervention, I do not think it is safe to perform atherectomy and PCI on the LAD prior to TAVR.  We will discuss with our team but I would anticipate moving forward with TAVR first. Recommendations: Overnight observation, dual antiplatelet therapy with aspirin and clopidogrel for 6 months, continued evaluation for TAVR in the near future.   Cardiac Studies   See above  Patient Profile     87 y.o. female with severe aortic stenosis and high-grade multivessel coronary artery disease involving the LAD and RCA, who presented 10/02/2020 for PCI  Assessment & Plan    1. CAD, other form of angina: Patient with severe two-vessel coronary artery disease with high-grade stenosis of the proximal RCA (large, dominant vessel) and severe diffuse calcific stenosis of the proximal to mid LAD. She presents for elective PCI  10/02/2020 with plans to treat both the RCA and LAD. She undergoes PCI of the RCA with OCT guidance. Even with uncomplicated stenting of the vessel, the patient develops prolonged hypotension in the cardiac catheterization lab necessitating IV fluid resuscitation and use of dopamine. Prolonged hypotension clearly due to the presence of very severe aortic stenosis. Remarkably, the patient had no symptoms with this and remained awake, alert, and tolerated very well. She weaned off of dopamine quickly at the end of the procedure. She has had no further complication. I do not think we can safely treat her LAD with atherectomy in the setting of her severe aortic stenosis. Plan to proceed with TAVR before LAD intervention. 2. Severe aortic stenosis, Stage D1: Pt admits to fatigue and exercise intolerance, Class II functional limitation. Continue multidisciplinary evaluation with plans for TAVR in the near future. This will be arranged by the structural heart team. 3. Hypertension: Continue losartan. Blood pressure has normalized. 4. Mixed hyperlipidemia: Continue rosuvastatin 10 mg daily  Disposition: Medically stable for discharge today. Follow-up plans to be arranged by the structural heart team. Anticipate TAVR in the near future.  For questions or updates, please contact [place] Please consult [email or web address] for contact info under     Signed, Sherren Mocha, MD  10/03/2020, 9:51 AM

## 2020-10-03 NOTE — H&P (View-Only) (Signed)
Progress Note  Patient Name: Renee Figueroa Date of Encounter: 10/03/2020  [facility] Cardiologist: No primary care provider on file.   Subjective   Doing well this morning. Ambulated with cardiac rehab without problems. No chest pain or shortness of breath.  Inpatient Medications    Scheduled Meds: . aspirin EC  81 mg Oral Daily  . clopidogrel  75 mg Oral Daily  . losartan  50 mg Oral Daily  . rosuvastatin  10 mg Oral Daily  . sodium chloride flush  3 mL Intravenous Q12H  . sodium chloride flush  3 mL Intravenous Q12H   Continuous Infusions: . sodium chloride     PRN Meds: sodium chloride, acetaminophen, fluticasone, ondansetron (ZOFRAN) IV, sodium chloride flush   Vital Signs    Vitals:   10/03/20 0019 10/03/20 0607 10/03/20 0751 10/03/20 0933  BP: (!) 144/88 (!) 173/72 (!) 154/78 (!) 154/70  Pulse: 76 74 74 83  Resp: 16 18 18 18   Temp: 98 F (36.7 C)  98.4 F (36.9 C) 98.2 F (36.8 C)  TempSrc: Oral Oral Oral Oral  SpO2: 95% 95% 98% 96%  Weight:      Height:        Intake/Output Summary (Last 24 hours) at 10/03/2020 0951 Last data filed at 10/03/2020 0640 Gross per 24 hour  Intake 421.25 ml  Output 200 ml  Net 221.25 ml   Last 3 Weights 10/02/2020 09/25/2020 09/08/2020  Weight (lbs) 99 lb 100 lb 99 lb  Weight (kg) 44.906 kg 45.36 kg 44.906 kg      Telemetry    NSR, no significant arrhythmia - Personally Reviewed  ECG    NSR 74 bpm, nonspecific ST-T wave abnormality - Personally Reviewed  Physical Exam  Alert, oriented, pleasant elderly woman in no distress GEN: No acute distress.   Neck: No JVD Cardiac: RRR, 3/6 harsh late peaking systolic murmur at the LLSB Respiratory: Clear to auscultation bilaterally. GI: Soft, nontender, non-distended  MS: No edema; No deformity. Right groin site clear. No hematoma or ecchymosis Neuro:  Nonfocal  Psych: Normal affect   Labs    High Sensitivity Troponin:  No results for input(s): TROPONINIHS in the  last 720 hours.    Chemistry Recent Labs  Lab 09/29/20 1200 10/03/20 0340  NA 137 137  K 3.9 4.1  CL 100 105  CO2 23 27  GLUCOSE 97 104*  BUN 12 10  CREATININE 0.61 0.72  CALCIUM 9.7 9.0  GFRNONAA 84 >60  GFRAA 96  --   ANIONGAP  --  5     Hematology Recent Labs  Lab 09/29/20 1200 10/03/20 0340  WBC 7.5 9.4  RBC 4.21 3.91  HGB 12.9 12.1  HCT 36.6 35.1*  MCV 87 89.8  MCH 30.6 30.9  MCHC 35.2 34.5  RDW 11.8 12.7  PLT 220 189    BNPNo results for input(s): BNP, PROBNP in the last 168 hours.   DDimer No results for input(s): DDIMER in the last 168 hours.   Radiology    CARDIAC CATHETERIZATION  Result Date: 10/02/2020 1.  Successful PCI of the RCA using a 3.0 x 15 mm resolute Onyx DES, guided by OCT imaging.  Procedure complicated by prolonged hypotension following stenting likely related to the presence of very severe aortic stenosis. 2.  Severe residual proximal to mid LAD stenosis with heavy calcification.  I had initially planned to treat this with atherectomy and PCI today.  Because of the patient's hemodynamic instability after an  uncomplicated RCA intervention, I do not think it is safe to perform atherectomy and PCI on the LAD prior to TAVR.  We will discuss with our team but I would anticipate moving forward with TAVR first. Recommendations: Overnight observation, dual antiplatelet therapy with aspirin and clopidogrel for 6 months, continued evaluation for TAVR in the near future.   Cardiac Studies   See above  Patient Profile     87 y.o. female with severe aortic stenosis and high-grade multivessel coronary artery disease involving the LAD and RCA, who presented 10/02/2020 for PCI  Assessment & Plan    1. CAD, other form of angina: Patient with severe two-vessel coronary artery disease with high-grade stenosis of the proximal RCA (large, dominant vessel) and severe diffuse calcific stenosis of the proximal to mid LAD. She presents for elective PCI  10/02/2020 with plans to treat both the RCA and LAD. She undergoes PCI of the RCA with OCT guidance. Even with uncomplicated stenting of the vessel, the patient develops prolonged hypotension in the cardiac catheterization lab necessitating IV fluid resuscitation and use of dopamine. Prolonged hypotension clearly due to the presence of very severe aortic stenosis. Remarkably, the patient had no symptoms with this and remained awake, alert, and tolerated very well. She weaned off of dopamine quickly at the end of the procedure. She has had no further complication. I do not think we can safely treat her LAD with atherectomy in the setting of her severe aortic stenosis. Plan to proceed with TAVR before LAD intervention. 2. Severe aortic stenosis, Stage D1: Pt admits to fatigue and exercise intolerance, Class II functional limitation. Continue multidisciplinary evaluation with plans for TAVR in the near future. This will be arranged by the structural heart team. 3. Hypertension: Continue losartan. Blood pressure has normalized. 4. Mixed hyperlipidemia: Continue rosuvastatin 10 mg daily  Disposition: Medically stable for discharge today. Follow-up plans to be arranged by the structural heart team. Anticipate TAVR in the near future.  For questions or updates, please contact [place] Please consult [email or web address] for contact info under     Signed, Sherren Mocha, MD  10/03/2020, 9:51 AM

## 2020-10-03 NOTE — Progress Notes (Signed)
CARDIAC REHAB PHASE I   PRE:  Rate/Rhythm: 81 SR  BP:  Supine: 154/78  Sitting:   Standing:    SaO2: 96%RA  MODE:  Ambulation: 300 ft   POST:  Rate/Rhythm: 102 ST  BP:  Supine:   Sitting: 176/85  Standing:    SaO2: 98%RA 0805-0845 Pt walked 300 ft on RA with hand held asst with steady gait and no CP. Discussed importance of plavix with stent. Reviewed NTG use, encouraged watching sodium (gave heart healthy diet), and discussed CRP 2 [place] for after she has had TAVR and PCI LAD. Referral made since pt had stent to meet protocol. Encouraged pt to take it easy and walk as tolerated, not overdoing. Pt voiced understanding.   Graylon Good, RN BSN  10/03/2020 8:42 AM

## 2020-10-05 MED FILL — Heparin Sod (Porcine)-NaCl IV Soln 1000 Unit/500ML-0.9%: INTRAVENOUS | Qty: 1000 | Status: AC

## 2020-10-06 ENCOUNTER — Other Ambulatory Visit: Payer: Self-pay

## 2020-10-06 DIAGNOSIS — I350 Nonrheumatic aortic (valve) stenosis: Secondary | ICD-10-CM

## 2020-10-08 ENCOUNTER — Other Ambulatory Visit: Payer: Self-pay

## 2020-10-08 DIAGNOSIS — I350 Nonrheumatic aortic (valve) stenosis: Secondary | ICD-10-CM

## 2020-10-15 NOTE — Progress Notes (Signed)
Surgical Instructions    Your procedure is scheduled on 10/20/20.  Report to [facility] Main Entrance "A" at 08:45 A.M., then check in with the Admitting office.  Call this number if you have problems the morning of surgery:  [phone]   If you have any questions prior to your surgery date call [phone]: Open Monday-Friday 8am-4pm    Remember:  Do not eat or drink after midnight the night before your surgery     Take these medicines the morning of surgery with A SIP OF WATER: NONE  As of today, STOP taking any  (unless otherwise instructed by your surgeon) Aleve, Naproxen, Ibuprofen, Motrin, Advil, Goody's, BC's, all herbal medications, fish oil, and all vitamins.  Continue taking aspirin and plavix through the day before surgery. DO NOT TAKE PLAVIX AND ASPIRIN THE DAY OF SURGERY.                      Do not wear jewelry, make up, or nail polish            Do not wear lotions, powders, perfumes/colognes, or deodorant.            Do not shave 48 hours prior to surgery.              Do not bring valuables to the hospital.            [facility] is not responsible for any belongings or valuables.  Do NOT Smoke (Tobacco/Vaping) or drink Alcohol 24 hours prior to your procedure If you use a CPAP at night, you may bring all equipment for your overnight stay.   Contacts, glasses, dentures or bridgework may not be worn into surgery, please bring cases for these belongings   For patients admitted to the hospital, discharge time will be determined by your treatment team.   Patients discharged the day of surgery will not be allowed to drive home, and someone needs to stay with them for 24 hours.    Special instructions:   [facility]- Preparing For Surgery  Before surgery, you can play an important role. Because skin is not sterile, your skin needs to be as free of germs as possible. You can reduce the number of germs on your skin by washing with CHG (chlorahexidine gluconate) Soap  before surgery.  CHG is an antiseptic cleaner which kills germs and bonds with the skin to continue killing germs even after washing.    Oral Hygiene is also important to reduce your risk of infection.  Remember - BRUSH YOUR TEETH THE MORNING OF SURGERY WITH YOUR REGULAR TOOTHPASTE  Please do not use if you have an allergy to CHG or antibacterial soaps. If your skin becomes reddened/irritated stop using the CHG.  Do not shave (including legs and underarms) for at least 48 hours prior to first CHG shower. It is OK to shave your face.  Please follow these instructions carefully.   1. Shower the NIGHT BEFORE SURGERY and the MORNING OF SURGERY  2. If you chose to wash your hair, wash your hair first as usual with your normal shampoo.  3. After you shampoo, rinse your hair and body thoroughly to remove the shampoo.  4. Wash Face and genitals (private parts) with your normal soap.   5.  Shower the NIGHT BEFORE SURGERY and the MORNING OF SURGERY with CHG Soap.   6. Use CHG Soap as you would any other liquid soap. You can apply CHG directly to  the skin and wash gently with a scrungie or a clean washcloth.   7. Apply the CHG Soap to your body ONLY FROM THE NECK DOWN.  Do not use on open wounds or open sores. Avoid contact with your eyes, ears, mouth and genitals (private parts). Wash Face and genitals (private parts)  with your normal soap.   8. Wash thoroughly, paying special attention to the area where your surgery will be performed.  9. Thoroughly rinse your body with warm water from the neck down.  10. DO NOT shower/wash with your normal soap after using and rinsing off the CHG Soap.  11. Pat yourself dry with a CLEAN TOWEL.  12. Wear CLEAN PAJAMAS to bed the night before surgery  13. Place CLEAN SHEETS on your bed the night before your surgery  14. DO NOT SLEEP WITH PETS.   Day of Surgery: Take a shower.  Wear Clean/Comfortable clothing the morning of surgery Do not apply any  deodorants/lotions.   Remember to brush your teeth WITH YOUR REGULAR TOOTHPASTE.   Please read over the following fact sheets that you were given.

## 2020-10-16 ENCOUNTER — Encounter (HOSPITAL_COMMUNITY)
Admission: RE | Admit: 2020-10-16 | Discharge: 2020-10-16 | Disposition: A | Discharge status: Home / Self Care | Payer: Medicare Other | Source: Ambulatory Visit | Attending: Cardiovascular Disease | Admitting: Cardiovascular Disease

## 2020-10-16 ENCOUNTER — Other Ambulatory Visit: Payer: Self-pay

## 2020-10-16 ENCOUNTER — Encounter: Payer: Self-pay | Admitting: Physical Therapy

## 2020-10-16 ENCOUNTER — Encounter (HOSPITAL_COMMUNITY): Payer: Self-pay

## 2020-10-16 ENCOUNTER — Ambulatory Visit: Payer: Medicare Other | Attending: Cardiovascular Disease | Admitting: Physical Therapy

## 2020-10-16 ENCOUNTER — Ambulatory Visit (HOSPITAL_COMMUNITY)
Admission: RE | Admit: 2020-10-16 | Discharge: 2020-10-16 | Disposition: A | Discharge status: Home / Self Care | Payer: Medicare Other | Source: Ambulatory Visit | Attending: Cardiovascular Disease | Admitting: Cardiovascular Disease

## 2020-10-16 DIAGNOSIS — R2689 Other abnormalities of gait and mobility: Secondary | ICD-10-CM | POA: Diagnosis not present

## 2020-10-16 DIAGNOSIS — I350 Nonrheumatic aortic (valve) stenosis: Secondary | ICD-10-CM

## 2020-10-16 DIAGNOSIS — Z01818 Encounter for other preprocedural examination: Secondary | ICD-10-CM

## 2020-10-16 HISTORY — DX: Atherosclerotic heart disease of native coronary artery without angina pectoris: I25.10

## 2020-10-16 LAB — BLOOD GAS, ARTERIAL
Acid-base deficit: 0.7 mmol/L (ref 0.0–2.0)
Bicarbonate: 23.3 mmol/L (ref 20.0–28.0)
Drawn by: 602861
FIO2: 21
O2 Saturation: 97.2 %
Patient temperature: 37
pCO2 arterial: 37.7 mmHg (ref 32.0–48.0)
pH, Arterial: 7.408 (ref 7.350–7.450)
pO2, Arterial: 91.4 mmHg (ref 83.0–108.0)

## 2020-10-16 LAB — COMPREHENSIVE METABOLIC PANEL
ALT: 18 U/L (ref 0–44)
AST: 21 U/L (ref 15–41)
Albumin: 4 g/dL (ref 3.5–5.0)
Alkaline Phosphatase: 67 U/L (ref 38–126)
Anion gap: 10 (ref 5–15)
BUN: 8 mg/dL (ref 8–23)
CO2: 22 mmol/L (ref 22–32)
Calcium: 9.5 mg/dL (ref 8.9–10.3)
Chloride: 102 mmol/L (ref 98–111)
Creatinine, Ser: 0.57 mg/dL (ref 0.44–1.00)
GFR, Estimated: >60 mL/min (ref >60)
Glucose, Bld: 106 mg/dL — ABNORMAL HIGH (ref 70–99)
Potassium: 4.1 mmol/L (ref 3.5–5.1)
Sodium: 134 mmol/L — ABNORMAL LOW (ref 135–145)
Total Bilirubin: 0.8 mg/dL (ref 0.3–1.2)
Total Protein: 7.2 g/dL (ref 6.5–8.1)

## 2020-10-16 LAB — CBC
HCT: 39.8 % (ref 36.0–46.0)
Hemoglobin: 13.7 g/dL (ref 12.0–15.0)
MCH: 31 pg (ref 26.0–34.0)
MCHC: 34.4 g/dL (ref 30.0–36.0)
MCV: 90 fL (ref 80.0–100.0)
Platelets: 258 10*3/uL (ref 150–400)
RBC: 4.42 MIL/uL (ref 3.87–5.11)
RDW: 12.6 % (ref 11.5–15.5)
WBC: 7.7 10*3/uL (ref 4.0–10.5)
nRBC: 0 % (ref 0.0–0.2)

## 2020-10-16 LAB — URINALYSIS, ROUTINE W REFLEX MICROSCOPIC
Bilirubin Urine: NEGATIVE
Glucose, UA: NEGATIVE mg/dL
Hgb urine dipstick: NEGATIVE
Ketones, ur: NEGATIVE mg/dL
Leukocytes,Ua: NEGATIVE
Nitrite: NEGATIVE
Protein, ur: NEGATIVE mg/dL
Specific Gravity, Urine: 1.006 (ref 1.005–1.030)
pH: 7 (ref 5.0–8.0)

## 2020-10-16 LAB — TYPE AND SCREEN
ABO/RH(D): A POS
Antibody Screen: NEGATIVE

## 2020-10-16 LAB — PROTIME-INR
INR: 1 (ref 0.8–1.2)
Prothrombin Time: 12.7 seconds (ref 11.4–15.2)

## 2020-10-16 NOTE — Progress Notes (Signed)
Carotid US complete    Please see CV Proc for preliminary results.   Vonzell Schlatter, RVT

## 2020-10-16 NOTE — Therapy (Signed)
[place], [place], [address] Phone: [phone]   Fax:  [phone]  Physical Therapy Pre-TAVR Evaluation  Patient Details  Name: Renee Figueroa MRN: [id number] Date of Birth: 1934-05-05 Referring Provider (PT): Sherren Mocha, MD   Encounter Date: 10/16/2020   PT End of Session - 10/16/20 1054    Visit Number 1    Number of Visits 1    Authorization Type [employer] MEDICARE    PT Start Time 2353    PT Stop Time 1205    PT Time Calculation (min) 30 min    Equipment Utilized During Treatment Gait belt    Activity Tolerance Patient tolerated treatment well    Behavior During Therapy [facility] for tasks assessed/performed           Past Medical History:  Diagnosis Date  . Allergy    seasonal  . Arthritis    mild  . Basal cell carcinoma of anterior chest    skin  . Colon polyps    adenomatous  . Coronary artery disease   . Diverticulosis   . Heart murmur   . Herpes zoster 04/2011   C 6  LUE  . Hyperlipidemia   . Hypertension   . Osteoporosis   . Renal calculi 1962    X 1     Past Surgical History:  Procedure Laterality Date  . CARDIAC CATHETERIZATION    . cataract    . colonoscopy with polypectomy  2004    adenomatous; Dr Henrene Pastor. Neg 2013  . CORONARY ATHERECTOMY  10/02/2020  . CORONARY STENT INTERVENTION N/A 10/02/2020   Procedure: CORONARY STENT INTERVENTION;  Surgeon: Sherren Mocha, MD;  Location: [place] [facility];  Service: Cardiovascular;  Laterality: N/A;  . DILATION AND CURETTAGE OF UTERUS     age 87  . EYE SURGERY    . G 2 P 2    . INTRAVASCULAR ULTRASOUND/IVUS N/A 10/02/2020   Procedure: Intravascular Ultrasound/IVUS;  Surgeon: Sherren Mocha, MD;  Location: [place] [facility];  Service: Cardiovascular;  Laterality: N/A;  . RIGHT/LEFT HEART CATH AND CORONARY ANGIOGRAPHY N/A 08/26/2020   Procedure: RIGHT/LEFT HEART CATH AND CORONARY ANGIOGRAPHY;  Surgeon: Sherren Mocha, MD;  Location:  [place] [facility];  Service: Cardiovascular;  Laterality: N/A;  . TONSILLECTOMY AND ADENOIDECTOMY      There were no vitals filed for this visit.    Subjective Assessment - 10/16/20 1054    Subjective Patient scheduled to have TAVR procedure next week. She reports she has felt tired or fatigued like she has to make herself do things. She was born with heart murmur and they did imaging that showed stenosis of valve.    Pertinent History History of hypertension, hyperlipidemia, osteoporosis and arthritis, and a long history of a heart murmur, severe aortic stenosis    Limitations Walking;House hold activities    How long can you sit comfortably? No limitation    How long can you walk comfortably? Not too long    Patient Stated Goals Get heart better    Currently in Pain? No/denies              [facility] PT Assessment - 10/16/20 0001      Assessment   Medical Diagnosis Severe Aortic Stenosis    Referring Provider (PT) Sherren Mocha, MD    Onset Date/Surgical Date --   approximately 6 months   Hand Dominance Right    Next MD Visit 10/20/2020   procedure  Precautions   Precautions None      Restrictions   Weight Bearing Restrictions No      Balance Screen   Has the patient fallen in the past 6 months No    Has the patient had a decrease in activity level because of a fear of falling?  No    Is the patient reluctant to leave their home because of a fear of falling?  No      Home Environment   Living Environment Private residence    Living Arrangements Spouse/significant other    Type of [place] Access Level entry    Home Layout Two level    Alternate Level Stairs-Number of Steps 15    Alternate Level Stairs-Rails Right;Left      Prior Function   Level of [place] Retired    Leisure None reported      Cognition   Overall Cognitive Status Within Functional Limits for tasks assessed      Observation/Other Assessments    Observations Patient appears in no apparent distress    Focus on Therapeutic Outcomes (FOTO)  NA      ROM / Strength   AROM / PROM / Strength AROM;Strength      AROM   Overall AROM Comments BUE and BLE grossly WFL and non-painful      Strength   Overall Strength Comments BUE and BLE strength grossly 4+/5 MMT and non-painful    Strength Assessment Site Hand    Right/Left hand Right;Left    Right Hand Grip (lbs) 32, 34, 30    Left Hand Grip (lbs) 40, 38, 38      Palpation   Palpation comment Non-TTP      Transfers   Transfers Independent with all Transfers      Ambulation/Gait   Ambulation/Gait Yes    Ambulation/Gait Assistance 7: Independent    Ambulation Distance (Feet) 800 Feet    Gait Comments Slightly coxalgic on right on right, slight flexed posturing with forward trunk lean, decreased step length            [facility] Pre-Surgical Assessment - 10/16/20 0001    5 Meter Walk Test- trial 1 3 sec    5 Meter Walk Test- trial 2 4 sec.     5 Meter Walk Test- trial 3 4 sec.    5 meter walk test average 3.67 sec    4 Stage Balance Test tolerated for:  8 sec.    4 Stage Balance Test Position 4    Sit To Stand Test- trial 1 14 sec.    ADL/IADL Independent with: Bathing;Dressing;Meal prep;Finances    ADL/IADL Needs Assistance with: Valla Leaver work    ADL/IADL Fraility Index Vulnerable    Other comment 10/12 faility index    6 Minute Walk- Baseline yes    BP (mmHg) 178/86    HR (bpm) 77    02 Sat (%RA) 92 %    Modified Borg Scale for Dyspnea 0- Nothing at all    Perceived Rate of Exertion (Borg) 6-    6 Minute Walk Post Test yes    BP (mmHg) 212/93    HR (bpm) 94    02 Sat (%RA) 94 %    Modified Borg Scale for Dyspnea 0.5- Very, very slight shortness of breath    Perceived Rate of Exertion (Borg) 9- very light    Aerobic Endurance Distance Walked 800  Objective measurements completed on examination: See above findings.               PT  Education - 10/16/20 1054    Education Details Exam findings, energy conservation    Person(s) Educated Patient    Methods Explanation    Comprehension Verbalized understanding                       Plan - 10/16/20 1055    Clinical Impression Statement See below.    Personal Factors and Comorbidities Fitness;Comorbidity 3+    Comorbidities History of hypertension, hyperlipidemia, osteoporosis and arthritis, and a long history of a heart murmur, severe aortic stenosis    Examination-Activity Limitations Locomotion Level    Examination-Participation Restrictions Cleaning;Community Activity;Shop    Stability/Clinical Decision Making Stable/Uncomplicated    Clinical Decision Making Low    Rehab Potential Good    PT Frequency One time visit    PT Treatment/Interventions Therapeutic exercise;Energy conservation;Gait training    PT Next Visit Plan NA    PT Home Exercise Plan NA    Consulted and Agree with Plan of Care Patient           Clinical Impression Statement: Pt is a 87 yo female presenting to OP PT for evaluation prior to possible TAVR surgery due to severe aortic stenosis. Pt reports onset of fatigue approximately 6 months ago. Symptoms are limiting community activity, walking, household tasks. Pt presents with good ROM and strength, good balance and is not at high fall risk 4 stage balance test, good walking speed and decreased aerobic endurance per 6 minute walk test. Pt ambulated a total of 800 feet in 6 minute walk. Fatigue increased with 6 minute walk test. Based on the Short Physical Performance Battery, patient has a frailty rating of 10/12 with </= 5/12 considered frail.   Patient demonstrates the following deficits and impairments:  Abnormal gait,Postural dysfunction,Decreased activity tolerance,Cardiopulmonary status limiting activity,Decreased endurance  Visit Diagnosis: Other abnormalities of gait and mobility     Problem List Patient Active Problem  List   Diagnosis Date Noted  . CAD (coronary artery disease) 10/02/2020  . Severe aortic stenosis 05/21/2020  . Hyperglycemia 05/01/2019  . Uses hearing aid 02/14/2018  . Heart murmur 02/12/2016  . COLONIC POLYPS, ADENOMATOUS 01/19/2010  . Allergic rhinitis 01/19/2010  . DEGENERATIVE JOINT DISEASE 01/19/2010  . SKIN CANCER, HX OF 01/19/2010  . Hyperlipidemia 07/27/2007  . Essential hypertension 07/27/2007  . Osteoporosis 07/27/2007    Hilda Blades, PT, DPT, LAT, ATC 10/16/20  12:14 PM Phone: [phone] Fax: [facility] [address] [place], [place], [address] Phone: [phone]   Fax:  [phone]  Name: Renee Figueroa MRN: [id number] Date of Birth: 1933/09/12

## 2020-10-16 NOTE — Progress Notes (Signed)
PCP - Billey Gosling Cardiologist - Dr. Oswaldo Milian  PPM/ICD - denies   Chest x-ray - 10/16/20 EKG - 10/05/20 Stress Test - denies ECHO - 05/21/20 Cardiac Cath - 08/26/20  Sleep Study - denies  Patient instructed to hold all NSAID's, herbal medications, fish oil and vitamins 7 days prior to surgery. Continue aspirin and plavix through the day of surgery. No medications day of surgery.  ERAS Protcol -no   COVID TEST- 10/16/20   Anesthesia review: yes, cardiac history  Patient denies shortness of breath, fever, cough and chest pain at PAT appointment   All instructions explained to the patient, with a verbal understanding of the material. Patient agrees to go over the instructions while at home for a better understanding. Patient also instructed to self quarantine after being tested for COVID-19. The opportunity to ask questions was provided.

## 2020-10-17 ENCOUNTER — Other Ambulatory Visit (HOSPITAL_COMMUNITY)
Admission: RE | Admit: 2020-10-17 | Discharge: 2020-10-17 | Disposition: A | Discharge status: Home / Self Care | Payer: Medicare Other | Source: Ambulatory Visit | Attending: Cardiovascular Disease | Admitting: Cardiovascular Disease

## 2020-10-17 DIAGNOSIS — Z20822 Contact with and (suspected) exposure to covid-19: Secondary | ICD-10-CM | POA: Insufficient documentation

## 2020-10-17 DIAGNOSIS — I959 Hypotension, unspecified: Secondary | ICD-10-CM | POA: Diagnosis not present

## 2020-10-17 DIAGNOSIS — Z01812 Encounter for preprocedural laboratory examination: Secondary | ICD-10-CM | POA: Insufficient documentation

## 2020-10-17 DIAGNOSIS — I350 Nonrheumatic aortic (valve) stenosis: Secondary | ICD-10-CM | POA: Diagnosis not present

## 2020-10-17 DIAGNOSIS — Z7902 Long term (current) use of antithrombotics/antiplatelets: Secondary | ICD-10-CM | POA: Diagnosis not present

## 2020-10-17 DIAGNOSIS — I10 Essential (primary) hypertension: Secondary | ICD-10-CM | POA: Diagnosis not present

## 2020-10-17 DIAGNOSIS — M810 Age-related osteoporosis without current pathological fracture: Secondary | ICD-10-CM | POA: Diagnosis not present

## 2020-10-17 DIAGNOSIS — E782 Mixed hyperlipidemia: Secondary | ICD-10-CM | POA: Diagnosis not present

## 2020-10-17 DIAGNOSIS — Z955 Presence of coronary angioplasty implant and graft: Secondary | ICD-10-CM | POA: Diagnosis not present

## 2020-10-17 DIAGNOSIS — Z79899 Other long term (current) drug therapy: Secondary | ICD-10-CM | POA: Diagnosis not present

## 2020-10-17 DIAGNOSIS — Z006 Encounter for examination for normal comparison and control in clinical research program: Secondary | ICD-10-CM | POA: Diagnosis not present

## 2020-10-17 DIAGNOSIS — Z7982 Long term (current) use of aspirin: Secondary | ICD-10-CM | POA: Diagnosis not present

## 2020-10-17 DIAGNOSIS — I348 Other nonrheumatic mitral valve disorders: Secondary | ICD-10-CM | POA: Diagnosis not present

## 2020-10-17 DIAGNOSIS — Z952 Presence of prosthetic heart valve: Secondary | ICD-10-CM | POA: Diagnosis not present

## 2020-10-17 DIAGNOSIS — I2510 Atherosclerotic heart disease of native coronary artery without angina pectoris: Secondary | ICD-10-CM | POA: Diagnosis not present

## 2020-10-17 LAB — SARS CORONAVIRUS 2 (TAT 6-24 HRS): SARS Coronavirus 2: NEGATIVE

## 2020-10-19 MED ORDER — POTASSIUM CHLORIDE 2 MEQ/ML IV SOLN
80.0000 meq | INTRAVENOUS | Status: DC
  Filled 2020-10-19: qty 40

## 2020-10-19 MED ORDER — SODIUM CHLORIDE 0.9 % IV SOLN
INTRAVENOUS | Status: DC
  Filled 2020-10-19: qty 30

## 2020-10-19 MED ORDER — SODIUM CHLORIDE 0.9 % IV SOLN
1.5000 g | INTRAVENOUS | Status: AC
  Administered 2020-10-20: 1.5 g via INTRAVENOUS
  Filled 2020-10-19: qty 1.5

## 2020-10-19 MED ORDER — DEXMEDETOMIDINE HCL IN NACL 400 MCG/100ML IV SOLN
0.1000 ug/kg/h | INTRAVENOUS | Status: AC
  Administered 2020-10-20: 1 ug/kg/h via INTRAVENOUS
  Filled 2020-10-19: qty 100

## 2020-10-19 MED ORDER — MAGNESIUM SULFATE 50 % IJ SOLN
40.0000 meq | INTRAMUSCULAR | Status: DC
  Filled 2020-10-19: qty 9.85

## 2020-10-19 MED ORDER — VANCOMYCIN HCL 1000 MG/200ML IV SOLN
1000.0000 mg | INTRAVENOUS | Status: AC
  Administered 2020-10-20: 1000 mg via INTRAVENOUS
  Filled 2020-10-19: qty 200

## 2020-10-19 MED ORDER — NOREPINEPHRINE 4 MG/250ML-% IV SOLN
0.0000 ug/min | INTRAVENOUS | Status: AC
  Administered 2020-10-20: 2 ug/min via INTRAVENOUS
  Filled 2020-10-19: qty 250

## 2020-10-20 ENCOUNTER — Encounter (HOSPITAL_COMMUNITY): Payer: Self-pay | Admitting: Cardiovascular Disease

## 2020-10-20 ENCOUNTER — Other Ambulatory Visit: Payer: Self-pay | Admitting: Physician Assistant

## 2020-10-20 ENCOUNTER — Inpatient Hospital Stay (HOSPITAL_COMMUNITY): Payer: Medicare Other | Admitting: Physician Assistant

## 2020-10-20 ENCOUNTER — Inpatient Hospital Stay (HOSPITAL_COMMUNITY)
Admission: RE | Admit: 2020-10-20 | Discharge: 2020-10-21 | DRG: 267 | Disposition: A | Discharge status: Home / Self Care | Payer: Medicare Other | Source: Ambulatory Visit | Attending: Cardiovascular Disease | Admitting: Cardiovascular Disease

## 2020-10-20 ENCOUNTER — Inpatient Hospital Stay (HOSPITAL_COMMUNITY): Payer: Medicare Other | Admitting: Anesthesiology

## 2020-10-20 ENCOUNTER — Encounter (HOSPITAL_COMMUNITY)
Admission: RE | Disposition: A | Discharge status: Home / Self Care | Payer: Self-pay | Source: Ambulatory Visit | Attending: Cardiovascular Disease

## 2020-10-20 ENCOUNTER — Other Ambulatory Visit: Payer: Self-pay

## 2020-10-20 ENCOUNTER — Ambulatory Visit (HOSPITAL_COMMUNITY)
Admission: RE | Admit: 2020-10-20 | Discharge: 2020-10-20 | Disposition: A | Discharge status: Home / Self Care | Payer: Medicare Other | Source: Ambulatory Visit | Attending: Cardiovascular Disease | Admitting: Cardiovascular Disease

## 2020-10-20 DIAGNOSIS — Z20822 Contact with and (suspected) exposure to covid-19: Secondary | ICD-10-CM | POA: Diagnosis present

## 2020-10-20 DIAGNOSIS — Z7982 Long term (current) use of aspirin: Secondary | ICD-10-CM

## 2020-10-20 DIAGNOSIS — Z79899 Other long term (current) drug therapy: Secondary | ICD-10-CM

## 2020-10-20 DIAGNOSIS — Z7902 Long term (current) use of antithrombotics/antiplatelets: Secondary | ICD-10-CM | POA: Diagnosis not present

## 2020-10-20 DIAGNOSIS — Z955 Presence of coronary angioplasty implant and graft: Secondary | ICD-10-CM

## 2020-10-20 DIAGNOSIS — E782 Mixed hyperlipidemia: Secondary | ICD-10-CM | POA: Diagnosis present

## 2020-10-20 DIAGNOSIS — Z006 Encounter for examination for normal comparison and control in clinical research program: Secondary | ICD-10-CM | POA: Diagnosis not present

## 2020-10-20 DIAGNOSIS — M810 Age-related osteoporosis without current pathological fracture: Secondary | ICD-10-CM | POA: Diagnosis present

## 2020-10-20 DIAGNOSIS — I10 Essential (primary) hypertension: Secondary | ICD-10-CM | POA: Diagnosis present

## 2020-10-20 DIAGNOSIS — I350 Nonrheumatic aortic (valve) stenosis: Secondary | ICD-10-CM

## 2020-10-20 DIAGNOSIS — Z952 Presence of prosthetic heart valve: Secondary | ICD-10-CM

## 2020-10-20 DIAGNOSIS — I2510 Atherosclerotic heart disease of native coronary artery without angina pectoris: Secondary | ICD-10-CM | POA: Diagnosis present

## 2020-10-20 DIAGNOSIS — I959 Hypotension, unspecified: Secondary | ICD-10-CM | POA: Diagnosis present

## 2020-10-20 DIAGNOSIS — E785 Hyperlipidemia, unspecified: Secondary | ICD-10-CM | POA: Diagnosis present

## 2020-10-20 HISTORY — PX: ULTRASOUND GUIDANCE FOR VASCULAR ACCESS: SHX6516

## 2020-10-20 HISTORY — DX: Presence of prosthetic heart valve: Z95.2

## 2020-10-20 HISTORY — PX: TRANSCATHETER AORTIC VALVE REPLACEMENT, TRANSFEMORAL: SHX6400

## 2020-10-20 HISTORY — PX: TEE WITHOUT CARDIOVERSION: SHX5443

## 2020-10-20 LAB — ECHOCARDIOGRAM LIMITED
AR max vel: 0.73 cm2
AV Area VTI: 0.81 cm2
AV Area mean vel: 0.71 cm2
AV Mean grad: 49 mmHg
AV Peak grad: 73.3 mmHg
Ao pk vel: 4.28 m/s

## 2020-10-20 LAB — POCT I-STAT, CHEM 8
BUN: 10 mg/dL (ref 8–23)
BUN: 10 mg/dL (ref 8–23)
Calcium, Ion: 1.27 mmol/L (ref 1.15–1.40)
Calcium, Ion: 1.3 mmol/L (ref 1.15–1.40)
Chloride: 104 mmol/L (ref 98–111)
Chloride: 105 mmol/L (ref 98–111)
Creatinine, Ser: 0.4 mg/dL — ABNORMAL LOW (ref 0.44–1.00)
Creatinine, Ser: 0.4 mg/dL — ABNORMAL LOW (ref 0.44–1.00)
Glucose, Bld: 97 mg/dL (ref 70–99)
Glucose, Bld: 113 mg/dL — ABNORMAL HIGH (ref 70–99)
HCT: 39 % (ref 36.0–46.0)
HCT: 36 % (ref 36.0–46.0)
Hemoglobin: 13.3 g/dL (ref 12.0–15.0)
Hemoglobin: 12.2 g/dL (ref 12.0–15.0)
Potassium: 3.8 mmol/L (ref 3.5–5.1)
Potassium: 4.1 mmol/L (ref 3.5–5.1)
Sodium: 140 mmol/L (ref 135–145)
Sodium: 140 mmol/L (ref 135–145)
TCO2: 25 mmol/L (ref 22–32)
TCO2: 22 mmol/L (ref 22–32)

## 2020-10-20 LAB — ABO/RH: ABO/RH(D): A POS

## 2020-10-20 SURGERY — IMPLANTATION, AORTIC VALVE, TRANSCATHETER, FEMORAL APPROACH
Anesthesia: Monitor Anesthesia Care | Site: Mouth

## 2020-10-20 MED ORDER — NITROGLYCERIN IN D5W 200-5 MCG/ML-% IV SOLN: 0.0000 ug/min | INTRAVENOUS | Status: DC

## 2020-10-20 MED ORDER — FLUTICASONE PROPIONATE 50 MCG/ACT NA SUSP: 1.0000 | Freq: Every day | NASAL | Status: DC | PRN

## 2020-10-20 MED ORDER — HEPARIN SODIUM (PORCINE) 1000 UNIT/ML IJ SOLN
INTRAMUSCULAR | Status: DC | PRN
  Administered 2020-10-20: 7000 [IU] via INTRAVENOUS

## 2020-10-20 MED ORDER — DEXAMETHASONE SODIUM PHOSPHATE 10 MG/ML IJ SOLN
INTRAMUSCULAR | Status: DC | PRN
  Administered 2020-10-20: 5 mg via INTRAVENOUS

## 2020-10-20 MED ORDER — LACTATED RINGERS IV SOLN: INTRAVENOUS | Status: DC | PRN

## 2020-10-20 MED ORDER — SODIUM CHLORIDE 0.9 % IV SOLN: INTRAVENOUS | Status: AC

## 2020-10-20 MED ORDER — TRAMADOL HCL 50 MG PO TABS: 50.0000 mg | ORAL_TABLET | ORAL | Status: DC | PRN

## 2020-10-20 MED ORDER — ONDANSETRON HCL 4 MG/2ML IJ SOLN
INTRAMUSCULAR | Status: AC
  Filled 2020-10-20: qty 4

## 2020-10-20 MED ORDER — HEPARIN SODIUM (PORCINE) 1000 UNIT/ML IJ SOLN
INTRAMUSCULAR | Status: AC
  Filled 2020-10-20: qty 2

## 2020-10-20 MED ORDER — SODIUM CHLORIDE 0.9 % IV SOLN: INTRAVENOUS | Status: DC

## 2020-10-20 MED ORDER — ONDANSETRON HCL 4 MG/2ML IJ SOLN
INTRAMUSCULAR | Status: DC | PRN
  Administered 2020-10-20: 4 mg via INTRAVENOUS

## 2020-10-20 MED ORDER — SODIUM CHLORIDE 0.9% FLUSH
3.0000 mL | Freq: Two times a day (BID) | INTRAVENOUS | Status: DC
  Administered 2020-10-20 – 2020-10-21 (×2): 3 mL via INTRAVENOUS

## 2020-10-20 MED ORDER — PROPOFOL 500 MG/50ML IV EMUL
INTRAVENOUS | Status: DC | PRN
  Administered 2020-10-20: 50 ug/kg/min via INTRAVENOUS

## 2020-10-20 MED ORDER — SODIUM CHLORIDE 0.9 % IV SOLN
INTRAVENOUS | Status: DC | PRN
  Administered 2020-10-20 (×3): 500 mL

## 2020-10-20 MED ORDER — PROTAMINE SULFATE 10 MG/ML IV SOLN
INTRAVENOUS | Status: AC
  Filled 2020-10-20: qty 10

## 2020-10-20 MED ORDER — ONDANSETRON HCL 4 MG/2ML IJ SOLN: 4.0000 mg | Freq: Four times a day (QID) | INTRAMUSCULAR | Status: DC | PRN

## 2020-10-20 MED ORDER — FENTANYL CITRATE (PF) 250 MCG/5ML IJ SOLN
INTRAMUSCULAR | Status: AC
  Filled 2020-10-20: qty 5

## 2020-10-20 MED ORDER — SODIUM CHLORIDE 0.9% FLUSH: 3.0000 mL | INTRAVENOUS | Status: DC | PRN

## 2020-10-20 MED ORDER — ROSUVASTATIN CALCIUM 5 MG PO TABS: 10.0000 mg | ORAL_TABLET | Freq: Every day | ORAL | Status: DC

## 2020-10-20 MED ORDER — CLOPIDOGREL BISULFATE 75 MG PO TABS
75.0000 mg | ORAL_TABLET | Freq: Every day | ORAL | Status: DC
  Administered 2020-10-21: 75 mg via ORAL
  Filled 2020-10-20: qty 1

## 2020-10-20 MED ORDER — DEXAMETHASONE SODIUM PHOSPHATE 10 MG/ML IJ SOLN
INTRAMUSCULAR | Status: AC
  Filled 2020-10-20: qty 1

## 2020-10-20 MED ORDER — MORPHINE SULFATE (PF) 2 MG/ML IV SOLN: 1.0000 mg | INTRAVENOUS | Status: DC | PRN

## 2020-10-20 MED ORDER — SODIUM CHLORIDE 0.9 % IV SOLN: 250.0000 mL | INTRAVENOUS | Status: DC | PRN

## 2020-10-20 MED ORDER — VANCOMYCIN HCL IN DEXTROSE 1-5 GM/200ML-% IV SOLN
1000.0000 mg | Freq: Once | INTRAVENOUS | Status: AC
  Administered 2020-10-20: 1000 mg via INTRAVENOUS
  Filled 2020-10-20: qty 200

## 2020-10-20 MED ORDER — SODIUM CHLORIDE 0.9 % IV SOLN
INTRAVENOUS | Status: AC
  Filled 2020-10-20: qty 1.2

## 2020-10-20 MED ORDER — PROTAMINE SULFATE 10 MG/ML IV SOLN
INTRAVENOUS | Status: DC | PRN
  Administered 2020-10-20: 70 mg via INTRAVENOUS

## 2020-10-20 MED ORDER — IODIXANOL 320 MG/ML IV SOLN
INTRAVENOUS | Status: DC | PRN
  Administered 2020-10-20: 40 mL via INTRA_ARTERIAL

## 2020-10-20 MED ORDER — ACETAMINOPHEN 650 MG RE SUPP: 650.0000 mg | Freq: Four times a day (QID) | RECTAL | Status: DC | PRN

## 2020-10-20 MED ORDER — FENTANYL CITRATE (PF) 100 MCG/2ML IJ SOLN
INTRAMUSCULAR | Status: DC | PRN
  Administered 2020-10-20: 50 ug via INTRAVENOUS

## 2020-10-20 MED ORDER — PHENYLEPHRINE HCL-NACL 20-0.9 MG/250ML-% IV SOLN
0.0000 ug/min | INTRAVENOUS | Status: DC
  Filled 2020-10-20: qty 250

## 2020-10-20 MED ORDER — LIDOCAINE HCL 1 % IJ SOLN
INTRAMUSCULAR | Status: DC | PRN
  Administered 2020-10-20: 20 mL

## 2020-10-20 MED ORDER — ASPIRIN EC 81 MG PO TBEC
81.0000 mg | DELAYED_RELEASE_TABLET | Freq: Every day | ORAL | Status: DC
  Administered 2020-10-21: 81 mg via ORAL
  Filled 2020-10-20: qty 1

## 2020-10-20 MED ORDER — PROTAMINE SULFATE 10 MG/ML IV SOLN
INTRAVENOUS | Status: AC
  Filled 2020-10-20: qty 5

## 2020-10-20 MED ORDER — SODIUM CHLORIDE 0.9 % IV SOLN
INTRAVENOUS | Status: AC
  Filled 2020-10-20 (×2): qty 1.2

## 2020-10-20 MED ORDER — ACETAMINOPHEN 325 MG PO TABS: 650.0000 mg | ORAL_TABLET | Freq: Four times a day (QID) | ORAL | Status: DC | PRN

## 2020-10-20 MED ORDER — CHLORHEXIDINE GLUCONATE 4 % EX LIQD: 30.0000 mL | CUTANEOUS | Status: DC

## 2020-10-20 MED ORDER — CHLORHEXIDINE GLUCONATE 4 % EX LIQD: 60.0000 mL | Freq: Once | CUTANEOUS | Status: DC

## 2020-10-20 MED ORDER — SODIUM CHLORIDE 0.9 % IV SOLN
1.5000 g | Freq: Two times a day (BID) | INTRAVENOUS | Status: DC
  Administered 2020-10-20 – 2020-10-21 (×2): 1.5 g via INTRAVENOUS
  Filled 2020-10-20 (×3): qty 1.5

## 2020-10-20 MED ORDER — LIDOCAINE HCL 1 % IJ SOLN
INTRAMUSCULAR | Status: AC
  Filled 2020-10-20: qty 20

## 2020-10-20 MED ORDER — CHLORHEXIDINE GLUCONATE 0.12 % MT SOLN
OROMUCOSAL | Status: AC
  Administered 2020-10-20: 15 mL via OROMUCOSAL
  Filled 2020-10-20: qty 15

## 2020-10-20 MED ORDER — OXYCODONE HCL 5 MG PO TABS: 5.0000 mg | ORAL_TABLET | ORAL | Status: DC | PRN

## 2020-10-20 MED ORDER — CHLORHEXIDINE GLUCONATE 0.12 % MT SOLN: 15.0000 mL | Freq: Once | OROMUCOSAL | Status: AC

## 2020-10-20 SURGICAL SUPPLY — 87 items
ADH SKN CLS APL DERMABOND .7 (GAUZE/BANDAGES/DRESSINGS) ×3
APL PRP STRL LF DISP 70% ISPRP (MISCELLANEOUS) ×3
BAG DECANTER FOR FLEXI CONT (MISCELLANEOUS) IMPLANT
BAG SNAP BAND KOVER 36X36 (MISCELLANEOUS) ×4 IMPLANT
BLADE CLIPPER SURG (BLADE) IMPLANT
BLADE STERNUM SYSTEM 6 (BLADE) IMPLANT
BLADE SURG 10 STRL SS (BLADE) IMPLANT
CABLE ADAPT CONN TEMP 6FT (ADAPTER) ×4 IMPLANT
CANISTER SUCT 3000ML PPV (MISCELLANEOUS) IMPLANT
CATH DIAG EXPO 6F AL1 (CATHETERS) ×2 IMPLANT
CATH DIAG EXPO 6F VENT PIG 145 (CATHETERS) ×8 IMPLANT
CATH EXTERNAL FEMALE PUREWICK (CATHETERS) IMPLANT
CATH INFINITI 6F AL2 (CATHETERS) IMPLANT
CATH S G BIP PACING (CATHETERS) ×4 IMPLANT
CHLORAPREP W/TINT 26 (MISCELLANEOUS) ×4 IMPLANT
CLIP VESOCCLUDE MED 24/CT (CLIP) IMPLANT
CLIP VESOCCLUDE SM WIDE 24/CT (CLIP) IMPLANT
CLOSURE PERCLOSE PROSTYLE (VASCULAR PRODUCTS) ×4 IMPLANT
CNTNR URN SCR LID CUP LEK RST (MISCELLANEOUS) ×6 IMPLANT
CONT SPEC 4OZ STRL OR WHT (MISCELLANEOUS) ×8
COVER BACK TABLE 80X110 HD (DRAPES) IMPLANT
COVER WAND RF STERILE (DRAPES) ×3 IMPLANT
DECANTER SPIKE VIAL GLASS SM (MISCELLANEOUS) ×4 IMPLANT
DERMABOND ADVANCED (GAUZE/BANDAGES/DRESSINGS) ×1
DERMABOND ADVANCED .7 DNX12 (GAUZE/BANDAGES/DRESSINGS) ×3 IMPLANT
DEVICE CLOSURE PERCLS PRGLD 6F (VASCULAR PRODUCTS) ×6 IMPLANT
DEVICE INFLATION ATRION QL2530 (MISCELLANEOUS) ×1 IMPLANT
DRAPE INCISE IOBAN 66X45 STRL (DRAPES) IMPLANT
DRSG TEGADERM 4X4.75 (GAUZE/BANDAGES/DRESSINGS) ×8 IMPLANT
ELECT CAUTERY BLADE 6.4 (BLADE) IMPLANT
ELECT REM PT RETURN 9FT ADLT (ELECTROSURGICAL) ×8
ELECTRODE REM PT RTRN 9FT ADLT (ELECTROSURGICAL) ×6 IMPLANT
FELT TEFLON 6X6 (MISCELLANEOUS) ×4 IMPLANT
GAUZE SPONGE 4X4 12PLY STRL (GAUZE/BANDAGES/DRESSINGS) ×4 IMPLANT
GLOVE BIO SURGEON STRL SZ7.5 (GLOVE) IMPLANT
GLOVE BIO SURGEON STRL SZ8 (GLOVE) IMPLANT
GLOVE EUDERMIC 7 POWDERFREE (GLOVE) IMPLANT
GLOVE ORTHO TXT STRL SZ7.5 (GLOVE) IMPLANT
GOWN STRL REUS W/ TWL LRG LVL3 (GOWN DISPOSABLE) IMPLANT
GOWN STRL REUS W/ TWL XL LVL3 (GOWN DISPOSABLE) ×3 IMPLANT
GOWN STRL REUS W/TWL LRG LVL3 (GOWN DISPOSABLE)
GOWN STRL REUS W/TWL XL LVL3 (GOWN DISPOSABLE) ×4
GUIDEWIRE SAF TJ AMPL .035X180 (WIRE) ×4 IMPLANT
GUIDEWIRE SAFE TJ AMPLATZ EXST (WIRE) ×4 IMPLANT
INSERT FOGARTY SM (MISCELLANEOUS) IMPLANT
KIT BASIN OR (CUSTOM PROCEDURE TRAY) ×4 IMPLANT
KIT HEART LEFT (KITS) ×4 IMPLANT
KIT SUCTION CATH 14FR (SUCTIONS) IMPLANT
KIT TURNOVER KIT B (KITS) ×4 IMPLANT
LOOP VESSEL MAXI BLUE (MISCELLANEOUS) IMPLANT
LOOP VESSEL MINI RED (MISCELLANEOUS) IMPLANT
NS IRRIG 1000ML POUR BTL (IV SOLUTION) ×4 IMPLANT
PACK ENDO MINOR (CUSTOM PROCEDURE TRAY) ×4 IMPLANT
PAD ARMBOARD 7.5X6 YLW CONV (MISCELLANEOUS) ×8 IMPLANT
PAD ELECT DEFIB RADIOL ZOLL (MISCELLANEOUS) ×4 IMPLANT
PENCIL BUTTON HOLSTER BLD 10FT (ELECTRODE) IMPLANT
PERCLOSE PROGLIDE 6F (VASCULAR PRODUCTS)
POSITIONER HEAD DONUT 9IN (MISCELLANEOUS) ×4 IMPLANT
SET MICROPUNCTURE 5F STIFF (MISCELLANEOUS) ×4 IMPLANT
SHEATH 14X36 EDWARDS (SHEATH) ×3 IMPLANT
SHEATH BRITE TIP 7FR 35CM (SHEATH) ×4 IMPLANT
SHEATH PINNACLE 6F 10CM (SHEATH) ×4 IMPLANT
SHEATH PINNACLE 8F 10CM (SHEATH) ×4 IMPLANT
SLEEVE REPOSITIONING LENGTH 30 (MISCELLANEOUS) ×4 IMPLANT
STOPCOCK MORSE 400PSI 3WAY (MISCELLANEOUS) ×8 IMPLANT
SUT ETHIBOND X763 2 0 SH 1 (SUTURE) IMPLANT
SUT GORETEX CV 4 TH 22 36 (SUTURE) IMPLANT
SUT GORETEX CV4 TH-18 (SUTURE) IMPLANT
SUT MNCRL AB 3-0 PS2 18 (SUTURE) IMPLANT
SUT PROLENE 5 0 C 1 36 (SUTURE) IMPLANT
SUT PROLENE 6 0 C 1 30 (SUTURE) IMPLANT
SUT SILK  1 MH (SUTURE) ×16
SUT SILK 1 MH (SUTURE) ×3 IMPLANT
SUT VIC AB 2-0 CT1 27 (SUTURE)
SUT VIC AB 2-0 CT1 TAPERPNT 27 (SUTURE) IMPLANT
SUT VIC AB 2-0 CTX 36 (SUTURE) IMPLANT
SUT VIC AB 3-0 SH 8-18 (SUTURE) IMPLANT
SYR 50ML LL SCALE MARK (SYRINGE) ×4 IMPLANT
SYR BULB IRRIG 60ML STRL (SYRINGE) IMPLANT
SYR MEDRAD MARK V 150ML (SYRINGE) ×4 IMPLANT
TOWEL GREEN STERILE (TOWEL DISPOSABLE) ×8 IMPLANT
TRANSDUCER W/STOPCOCK (MISCELLANEOUS) ×8 IMPLANT
TRAY FOLEY SLVR 14FR TEMP STAT (SET/KITS/TRAYS/PACK) IMPLANT
TUBE SUCT INTRACARD DLP 20F (MISCELLANEOUS) IMPLANT
VALVE 20 ULTRA SAPIEN KIT (Valve) ×1 IMPLANT
WIRE EMERALD 3MM-J .035X150CM (WIRE) ×5 IMPLANT
WIRE EMERALD 3MM-J .035X260CM (WIRE) ×5 IMPLANT

## 2020-10-20 NOTE — Anesthesia Procedure Notes (Signed)
Arterial Line Insertion Start/End3/08/2020 9:45 AM, 10/20/2020 9:55 AM Performed by: Janace Litten, CRNA, CRNA  Patient location: Pre-op. Preanesthetic checklist: patient identified, IV checked, site marked, risks and benefits discussed, surgical consent, monitors and equipment checked, pre-op evaluation, timeout performed and anesthesia consent Lidocaine 1% used for infiltration Right, radial was placed Catheter size: 20 G Hand hygiene performed , maximum sterile barriers used  and Seldinger technique used Allen's test indicative of satisfactory collateral circulation Attempts: 1 Procedure performed without using ultrasound guided technique. Following insertion, dressing applied and Biopatch. Post procedure assessment: normal and unchanged

## 2020-10-20 NOTE — Transfer of Care (Signed)
Immediate Anesthesia Transfer of Care Note  Patient: Renee Figueroa  Procedure(s) Performed: TRANSCATHETER AORTIC VALVE REPLACEMENT, TRANSFEMORAL (N/A Groin) TRANSESOPHAGEAL ECHOCARDIOGRAM (TEE) (N/A Mouth) ULTRASOUND GUIDANCE FOR VASCULAR ACCESS (Bilateral Groin)  Patient Location: PACU  Anesthesia Type:MAC  Level of Consciousness: drowsy  Airway & Oxygen Therapy: Patient Spontanous Breathing and Patient connected to face mask oxygen  Post-op Assessment: Report given to RN and Post -op Vital signs reviewed and stable  Post vital signs: Reviewed and stable  Last Vitals:  Vitals Value Taken Time  BP 168/52 10/20/20 1501  Temp    Pulse 68 10/20/20 1501  Resp 19 10/20/20 1501  SpO2 100 % 10/20/20 1501  Vitals shown include unvalidated device data.  Last Pain:  Vitals:   10/20/20 0913  TempSrc:   PainSc: 0-No pain      Patients Stated Pain Goal: 3 (88/82/80 0349)  Complications: No complications documented.

## 2020-10-20 NOTE — Progress Notes (Addendum)
  [facility]   MULTIDISCIPLINARY HEART VALVE TEAM  Patient doing well s/p TAVR. She is hemodynamically stable. Groin sites stable. ECG with sinus and no high grade block. Arterial line discontinued and transferred to 4E. Plan for early ambulation after bedrest completed and hopeful discharge over the next 24-48 hours.   Haji Delaine PA-C  [facility]  Pager [phone]  

## 2020-10-20 NOTE — Op Note (Signed)
[facility]   MULTIDISCIPLINARY HEART VALVE TEAM   TAVR OPERATIVE NOTE   Date of Procedure:  10/20/2020  Preoperative Diagnosis: Severe Aortic Stenosis   Postoperative Diagnosis: Same   Procedure:    Transcatheter Aortic Valve Replacement - Percutaneous  Transfemoral Approach  Edwards Sapien 3 THV (size 20 mm, model # P825213, serial # [id number])   Co-Surgeons:  Valentina Gu. Roxy Manns, MD and Sherren Mocha, MD  Anesthesiologist:  Belinda Block, MD  Echocardiographer:  Jenkins Rouge, MD  Pre-operative Echo Findings:  Severe aortic stenosis  Normal left ventricular systolic function  Post-operative Echo Findings:  No paravalvular leak  Normal/unchanged left ventricular systolic function  BRIEF CLINICAL NOTE AND INDICATIONS FOR SURGERY  Please see the complete note of Dr Roxy Manns for patient background and full procedural indications.   During the course of the patient's preoperative work up they have been evaluated comprehensively by a multidisciplinary team of specialists coordinated through the [facility] in the [facility].  They have been demonstrated to suffer from symptomatic severe aortic stenosis as noted above. The patient has been counseled extensively as to the relative risks and benefits of all options for the treatment of severe aortic stenosis including long term medical therapy, conventional surgery for aortic valve replacement, and transcatheter aortic valve replacement.  The patient has been independently evaluated in formal cardiac surgical consultation by Dr Cyndia Bent, who deemed the patient appropriate for TAVR. Based upon review of all of the patient's preoperative diagnostic tests they are felt to be candidate for transcatheter aortic valve replacement using the transfemoral approach as an alternative to conventional surgery.    Following the decision to proceed with transcatheter aortic valve  replacement, a discussion has been held regarding what types of management strategies would be attempted intraoperatively in the event of life-threatening complications, including whether or not the patient would be considered a candidate for the use of cardiopulmonary bypass and/or conversion to open sternotomy for attempted surgical intervention.  The patient has been advised of a variety of complications that might develop peculiar to this approach including but not limited to risks of death, stroke, paravalvular leak, aortic dissection or other major vascular complications, aortic annulus rupture, device embolization, cardiac rupture or perforation, acute myocardial infarction, arrhythmia, heart block or bradycardia requiring permanent pacemaker placement, congestive heart failure, respiratory failure, renal failure, pneumonia, infection, other late complications related to structural valve deterioration or migration, or other complications that might ultimately cause a temporary or permanent loss of functional independence or other long term morbidity.  The patient provides full informed consent for the procedure as described and all questions were answered preoperatively.  DETAILS OF THE OPERATIVE PROCEDURE  PREPARATION:   The patient is brought to the operating room on the above mentioned date and central monitoring was established by the anesthesia team including placement of a central venous catheter and radial arterial line. The patient is placed in the supine position on the operating table.  Intravenous antibiotics are administered. The patient is monitored closely throughout the procedure under conscious sedation.  Baseline transthoracic echocardiogram is performed. The patient's chest, abdomen, both groins, and both lower extremities are prepared and draped in a sterile manner. A time out procedure is performed.   PERIPHERAL ACCESS:   Using ultrasound guidance, femoral arterial and venous  access is obtained with placement of 6 Fr sheaths on the left side.  A pigtail diagnostic catheter was passed through the femoral arterial sheath under fluoroscopic  guidance into the aortic root.  A temporary transvenous pacemaker catheter was passed through the femoral venous sheath under fluoroscopic guidance into the right ventricle.  The pacemaker was tested to ensure stable lead placement and pacemaker capture. Aortic root angiography was performed in order to determine the optimal angiographic angle for valve deployment.  TRANSFEMORAL ACCESS:  A micropuncture technique is used to access the right femoral artery under fluoroscopic and ultrasound guidance.  2 Perclose devices are deployed at 10' and 2' positions to 'PreClose' the femoral artery. An 8 French sheath is placed and then an Amplatz Superstiff wire is advanced through the sheath. This is changed out for a 14 French transfemoral E-Sheath after progressively dilating over the Superstiff wire.  Catheters and wires would not pass through the mid-portion of the sheath. Fluoroscopy showed that the sheath is kinked. We are able to pass the 14 Fr dilator back through the sheath and it is removed over a super stiff wire. The sheath is replaced after carefully advancing another 14 Fr sheath. The sheath is again kinked and we felt this is related to severe tortuosity of the iliac artery. We decided to shift to the left side for primary access. The 6 Fr sheath is removed and the vessel is Preclosed x 2. A 14 Fr E-sheath I inserted over a super stiff wire without difficulty.  An AL-1 catheter was used to direct a straight-tip exchange length wire across the native aortic valve into the left ventricle. This was exchanged out for a pigtail catheter and position was confirmed in the LV apex. Simultaneous LV and Ao pressures were recorded.  The pigtail catheter was exchanged for an Amplatz Extra-stiff wire in the LV apex.    BALLOON AORTIC VALVULOPLASTY:  Not  performed  TRANSCATHETER HEART VALVE DEPLOYMENT:  An Edwards Sapien 3 transcatheter heart valve (size 20 mm) was prepared and crimped per manufacturer's guidelines, and the proper orientation of the valve is confirmed on the [employer] delivery system. The valve was advanced through the introducer sheath using normal technique until in an appropriate position in the abdominal aorta beyond the sheath tip. The balloon was then retracted and using the fine-tuning wheel was centered on the valve. The valve was then advanced across the aortic arch using appropriate flexion of the catheter. The valve was carefully positioned across the aortic valve annulus. The Commander catheter was retracted using normal technique. Once final position of the valve has been confirmed by angiographic assessment, the valve is deployed while temporarily holding ventilation and during rapid ventricular pacing to maintain systolic blood pressure < 50 mmHg and pulse pressure < 10 mmHg. The balloon inflation is held for >3 seconds after reaching full deployment volume. Once the balloon has fully deflated the balloon is retracted into the ascending aorta and valve function is assessed using echocardiography. The patient's hemodynamic recovery following valve deployment is good.  The deployment balloon and guidewire are both removed. Echo demostrated acceptable post-procedural gradients, stable mitral valve function, and no aortic insufficiency.    PROCEDURE COMPLETION:  The sheath was removed and femoral artery closure is performed using the 2 previously deployed Perclose devices on both the Right and Left sides.  Protamine is administered once femoral arterial repair was complete. The site is clear with no evidence of bleeding or hematoma after the sutures are tightened. The temporary pacemaker and pigtail catheters are removed.  The patient tolerated the procedure well and is transported to the surgical intensive care in stable  condition. There  were no immediate intraoperative complications. All sponge instrument and needle counts are verified correct at completion of the operation.   Sherren Mocha, MD 10/20/2020 8:47 PM

## 2020-10-20 NOTE — Interval H&P Note (Signed)
History and Physical Interval Note:  10/20/2020 10:16 AM  Renee Figueroa  has presented today for surgery, with the diagnosis of Severe Aortic Stenosis.  The various methods of treatment have been discussed with the patient and family. After consideration of risks, benefits and other options for treatment, the patient has consented to  Procedure(s): TRANSCATHETER AORTIC VALVE REPLACEMENT, TRANSFEMORAL (N/A) TRANSESOPHAGEAL ECHOCARDIOGRAM (TEE) (N/A) as a surgical intervention.  The patient's history has been reviewed, patient examined, no change in status, stable for surgery.  I have reviewed the patient's chart and labs.  Questions were answered to the patient's satisfaction.    The patient is evaluated in the short stay area.  She reports no clinical change since she underwent PCI of the right coronary artery.  She has been demonstrated does suffer from stage D1 aortic stenosis.  She has a small aortic valve annulus but is not at high risk of severe patient prosthesis mismatch because of her low body weight of 45 kg.  It is important to preserve access to her coronary arteries as she has significant residual LAD stenosis without angina.  We plan to proceed with implantation of a 20 mm SAPIEN 3 valve via transfemoral access today.  Risks, indications, and alternatives have been reviewed with the patient.  She understands and agrees to proceed.  Sherren Mocha

## 2020-10-20 NOTE — Progress Notes (Signed)
Mobility Specialist: Progress Note   10/20/20 1908  Mobility  Activity Ambulated in hall  Level of Assistance Contact guard assist, steadying assist  Assistive Device Front wheel walker  Distance Ambulated (ft) 470 ft  Mobility Response Tolerated well  Mobility performed by Mobility specialist  $Mobility charge 1 Mobility   Pre-Mobility: 64 HR, 127/55 BP, 97% SpO2 Post-Mobility: 67 HR, 142/54 BP, 99% SpO2  Pt asx during ambulation. Pt c/o slight pain before ambulation but said it was tolerable. Pt back to bed after walk, RN present.   [facility] Renee Figueroa Mobility Specialist Mobility Specialist Phone: [phone]

## 2020-10-20 NOTE — Anesthesia Procedure Notes (Addendum)
Procedure Name: MAC Date/Time: 10/20/2020 12:41 PM Performed by: Reece Agar, CRNA Pre-anesthesia Checklist: Patient identified, Emergency Drugs available, Suction available and Patient being monitored Patient Re-evaluated:Patient Re-evaluated prior to induction Oxygen Delivery Method: Simple face mask

## 2020-10-20 NOTE — Discharge Instructions (Signed)
ACTIVITY AND EXERCISE °• Daily activity and exercise are an important part of your recovery. People recover at different rates depending on their general health and type of valve procedure. °• Most people recovering from TAVR feel better relatively quickly  °• No lifting, pushing, pulling more than 10 pounds (examples to avoid: groceries, vacuuming, gardening, golfing): °            - For one week with a procedure through the groin. °            - For six weeks for procedures through the chest wall or neck. °NOTE: You will typically see one of our providers 7-14 days after your procedure to discuss WHEN TO RESUME the above activities.  °  °  °DRIVING °• Do not drive until you are seen for follow up and cleared by a provider. Generally, we ask patient to not drive for 1 week after their procedure. °• If you have been told by your doctor in the past that you may not drive, you must talk with him/her before you begin driving again. °  °DRESSING °• Groin site: you may leave the clear dressing over the site for up to one week or until it falls off. °  °HYGIENE °• If you had a femoral (leg) procedure, you may take a shower when you return home. After the shower, pat the site dry. Do NOT use powder, oils or lotions in your groin area until the site has completely healed. °• If you had a chest procedure, you may shower when you return home unless specifically instructed not to by your discharging practitioner. °            - DO NOT scrub incision; pat dry with a towel. °            - DO NOT apply any lotions, oils, powders to the incision. °            - No tub baths / swimming for at least 2 weeks. °• If you notice any fevers, chills, increased pain, swelling, bleeding or pus, please contact your doctor. °  °ADDITIONAL INFORMATION °• If you are going to have an upcoming dental procedure, please contact our office as you will require antibiotics ahead of time to prevent infection on your heart valve.  ° ° °If you have any  questions or concerns you can call the structural heart phone during normal business hours 8am-4pm. If you have an urgent need after hours or weekends please call [phone] to talk to the on call provider for general cardiology. If you have an emergency that requires immediate attention, please call 911.  ° ° °After TAVR Checklist ° °Check  Test Description  ° Follow up appointment in 1-2 weeks  You will see our structural heart physician assistant, Katie Kellon Chalk. Your incision sites will be checked and you will be cleared to drive and resume all normal activities if you are doing well.    ° 1 month echo and follow up  You will have an echo to check on your new heart valve and be seen back in the office by Katie Shandelle Borrelli. Many times the echo is not read by your appointment time, but Katie will call you later that day or the following day to report your results.  ° Follow up with your primary cardiologist You will need to be seen by your primary cardiologist in the following 3-6 months after your 1 month appointment in the valve   clinic. Often times your Plavix or Aspirin will be discontinued during this time, but this is decided on a case by case basis.   ° 1 year echo and follow up You will have another echo to check on your heart valve after 1 year and be seen back in the office by Katie Brodan Grewell. This your last structural heart visit.  ° Bacterial endocarditis prophylaxis  You will have to take antibiotics for the rest of your life before all dental procedures (even teeth cleanings) to protect your heart valve. Antibiotics are also required before some surgeries. Please check with your cardiologist before scheduling any surgeries. Also, please make sure to tell us if you have a penicillin allergy as you will require an alternative antibiotic.   ° ° °

## 2020-10-20 NOTE — Progress Notes (Signed)
Arrived to [address] [place] from [employer].  S/p TVAR.  Bilateral groins clean. dry, intact, level 0.  CCMD notified.  Vital signs.

## 2020-10-20 NOTE — Anesthesia Preprocedure Evaluation (Addendum)
Anesthesia Evaluation  Patient identified by MRN, date of birth, ID band Patient awake    Reviewed: Allergy & Precautions, NPO status   Airway Mallampati: II  TM Distance: >3 FB     Dental   Pulmonary    breath sounds clear to auscultation       Cardiovascular hypertension, + CAD  + Valvular Problems/Murmurs AS  Rhythm:Regular Rate:Normal + Systolic murmurs    Neuro/Psych    GI/Hepatic negative GI ROS, Neg liver ROS,   Endo/Other    Renal/GU Renal disease     Musculoskeletal   Abdominal   Peds  Hematology   Anesthesia Other Findings   Reproductive/Obstetrics                           Anesthesia Physical Anesthesia Plan  ASA: III  Anesthesia Plan: MAC   Post-op Pain Management:    Induction: Intravenous  PONV Risk Score and Plan: Ondansetron, Dexamethasone and Treatment may vary due to age or medical condition  Airway Management Planned: Mask and Simple Face Mask  Additional Equipment: Arterial line  Intra-op Plan:   Post-operative Plan:   Informed Consent: I have reviewed the patients History and Physical, chart, labs and discussed the procedure including the risks, benefits and alternatives for the proposed anesthesia with the patient or authorized representative who has indicated his/her understanding and acceptance.     Dental advisory given  Plan Discussed with: CRNA and Anesthesiologist  Anesthesia Plan Comments:        Anesthesia Quick Evaluation

## 2020-10-20 NOTE — Op Note (Signed)
[facility]   MULTIDISCIPLINARY HEART VALVE TEAM   TAVR OPERATIVE NOTE   Date of Procedure:  10/20/2020  Preoperative Diagnosis: Severe Aortic Stenosis   Postoperative Diagnosis: Same   Procedure:    Transcatheter Aortic Valve Replacement - Percutaneous Left Transfemoral Approach  Edwards Sapien 3 Ultra THV (size 20 mm, model # 9750TFX, serial # [id number])   Co-Surgeons:  Valentina Gu. Roxy Manns, MD and Sherren Mocha, MD  Anesthesiologist:  Belinda Block, MD  Echocardiographer:  Jenkins Rouge, MD  Pre-operative Echo Findings:  Severe aortic stenosis  Normal left ventricular systolic function  Post-operative Echo Findings:  No paravalvular leak  Normal left ventricular systolic function   BRIEF CLINICAL NOTE AND INDICATIONS FOR SURGERY  The patient is an 87 year old woman with a history of hypertension, hyperlipidemia, osteoporosis and arthritis, and a long history of a heart murmur.  She was noted to have a louder heart murmur by her PCP and underwent 2D echocardiogram on 05/20/2020.  This showed a mean gradient across aortic valve of 40 mmHg with a peak gradient of 61 mmHg.  Aortic valve area was 0.9 cm.  Left ventricular ejection fraction was 70 to 75% with hyperdynamic function and mild LVH with grade 1 diastolic dysfunction.  There was felt to be severe mid cavitary obstruction with a mid cavitary gradient of 90 mmHg.  She was evaluated by Dr. Burt Knack and was found to have a BNP of 1512.  She underwent cardiac catheterization on 08/26/2020 showing severe two-vessel coronary disease with 90% proximal and 80% mid LAD stenosis as well as 80% proximal RCA stenosis.  The mean gradient across the aortic valve was measured at 53 mmHg with a peak to peak gradient of 79 mmHg.  Calculated aortic valve area was 0.48 cm  Because of her severe coronary artery disease she underwent PCI and stenting of the high-grade stenosis in the proximal right coronary artery.  Attempts to  intervene on the disease in the left coronary circulation were deferred until after definitive management of her aortic stenosis.  During the course of the patient's preoperative work up they have been evaluated comprehensively by a multidisciplinary team of specialists coordinated through the [facility] in the [facility].  They have been demonstrated to suffer from symptomatic severe aortic stenosis as noted above. The patient has been counseled extensively as to the relative risks and benefits of all options for the treatment of severe aortic stenosis including long term medical therapy, conventional surgery for aortic valve replacement, and transcatheter aortic valve replacement.  All questions have been answered, and the patient provides full informed consent for the operation as described.   DETAILS OF THE OPERATIVE PROCEDURE  PREPARATION:    The patient is brought to the operating room on the above mentioned date and appropriate monitoring was established by the anesthesia team. The patient is placed in the supine position on the operating table.  Intravenous antibiotics are administered. The patient is monitored closely throughout the procedure under conscious sedation.  Baseline transthoracic echocardiogram was performed. The patient's chest, abdomen, both groins, and both lower extremities are prepared and draped in a sterile manner. A time out procedure is performed.   PERIPHERAL ACCESS:    Using the modified Seldinger technique, femoral arterial and venous access was obtained with placement of 6 Fr sheaths on the left side.  A pigtail diagnostic catheter was passed through the left arterial sheath under fluoroscopic guidance into the aortic root.  A  temporary transvenous pacemaker catheter was passed through the left femoral venous sheath under fluoroscopic guidance into the right ventricle.  The pacemaker was tested to ensure stable  lead placement and pacemaker capture. Aortic root angiography was performed in order to determine the optimal angiographic angle for valve deployment.   TRANSFEMORAL ACCESS:   Percutaneous transfemoral access and sheath placement was performed using ultrasound guidance.  The right common femoral artery was cannulated using a micropuncture needle and appropriate location was verified using hand injection angiogram.  A pair of Abbott Perclose percutaneous closure devices were placed and a 6 French sheath replaced into the femoral artery.  The patient was heparinized systemically and ACT verified > 250 seconds.    A 14 Fr transfemoral E-sheath was introduced into the right common femoral artery after progressively dilating over an Amplatz superstiff wire.  Attempts to an AL-1 catheter through the sheath were met with resistance and it became apparent that there was a kink in the sheath located near the bifurcation of the right common iliac artery.  A decision was made to replace the sheath.  The dilator was replaced and a superstiff Amplatz wire was replaced under fluoroscopy.  The initial sheath was removed and a second 48 French transfemoral E sheath was introduced into the right common femoral artery.  The second sheath notably kinked at the same location.  At this point rather than risking causing injury to the iliac artery by working through the kinked sheath decision was made to switch primary access to the left side.  Each sheath in the right common femoral artery was then pulled back until the tip of the sheath was in the common iliac artery.  Through this sheath a pigtail catheter was advanced in position in the aortic root after removal of the pigtail catheter that had previously been placed from the left side.  The existing 8 French sheath in the left femoral artery was removed and a pair of Abbott Perclose percutaneous closure devices were placed and a 8 French sheath replaced into the femoral  artery.  A 14 Fr transfemoral E-sheath was introduced into the left common femoral artery after progressively dilating over an Amplatz superstiff wire.   An AL-1 catheter was used to direct a straight-tip exchange length wire across the native aortic valve into the left ventricle. This was exchanged out for a pigtail catheter and position was confirmed in the LV apex. Simultaneous LV and Ao pressures were recorded.  The pigtail catheter was exchanged for an Amplatz Extra-stiff wire in the LV apex.  Echocardiography was utilized to confirm appropriate wire position and no sign of entanglement in the mitral subvalvular apparatus.   TRANSCATHETER HEART VALVE DEPLOYMENT:   An Edwards Sapien 3 Ultra transcatheter heart valve (size 20 mm, model #9750TFX, serial #[id number]) was prepared and crimped per manufacturer's guidelines, and the proper orientation of the valve is confirmed on the [employer] delivery system. The valve was advanced through the introducer sheath using normal technique until in an appropriate position in the abdominal aorta beyond the sheath tip. The balloon was then retracted and using the fine-tuning wheel was centered on the valve. The valve was then advanced across the aortic arch using appropriate flexion of the catheter. The valve was carefully positioned across the aortic valve annulus. The Commander catheter was retracted using normal technique. Once final position of the valve has been confirmed by angiographic assessment, the valve is deployed while temporarily holding ventilation and during rapid ventricular pacing to maintain  systolic blood pressure < 50 mmHg and pulse pressure < 10 mmHg. The balloon inflation is held for >3 seconds after reaching full deployment volume. Once the balloon has fully deflated the balloon is retracted into the ascending aorta and valve function is assessed using echocardiography. There is felt to be no paravalvular leak and no central aortic  insufficiency.  The patient's hemodynamic recovery following valve deployment is good.  The deployment balloon and guidewire are both removed.    PROCEDURE COMPLETION:   The E-sheaths were removed and femoral artery closure performed on both sides.  Protamine was administered once femoral arterial repair was complete. The temporary pacemaker, pigtail catheters and venous sheath were removed with manual pressure used for hemostasis.   The patient tolerated the procedure well and is transported to the surgical intensive care in stable condition. There were no immediate intraoperative complications. All sponge instrument and needle counts are verified correct at completion of the operation.   No blood products were administered during the operation.     Rexene Alberts, MD 10/20/2020 2:41 PM

## 2020-10-21 ENCOUNTER — Inpatient Hospital Stay (HOSPITAL_COMMUNITY): Payer: Medicare Other

## 2020-10-21 ENCOUNTER — Encounter (HOSPITAL_COMMUNITY): Payer: Self-pay | Admitting: Cardiovascular Disease

## 2020-10-21 DIAGNOSIS — Z952 Presence of prosthetic heart valve: Secondary | ICD-10-CM

## 2020-10-21 DIAGNOSIS — E782 Mixed hyperlipidemia: Secondary | ICD-10-CM | POA: Diagnosis not present

## 2020-10-21 DIAGNOSIS — I350 Nonrheumatic aortic (valve) stenosis: Secondary | ICD-10-CM | POA: Diagnosis not present

## 2020-10-21 DIAGNOSIS — I10 Essential (primary) hypertension: Secondary | ICD-10-CM | POA: Diagnosis not present

## 2020-10-21 DIAGNOSIS — Z006 Encounter for examination for normal comparison and control in clinical research program: Secondary | ICD-10-CM | POA: Diagnosis not present

## 2020-10-21 LAB — CBC
HCT: 31 % — ABNORMAL LOW (ref 36.0–46.0)
Hemoglobin: 10.7 g/dL — ABNORMAL LOW (ref 12.0–15.0)
MCH: 30.7 pg (ref 26.0–34.0)
MCHC: 34.5 g/dL (ref 30.0–36.0)
MCV: 89.1 fL (ref 80.0–100.0)
Platelets: 182 10*3/uL (ref 150–400)
RBC: 3.48 MIL/uL — ABNORMAL LOW (ref 3.87–5.11)
RDW: 12.6 % (ref 11.5–15.5)
WBC: 10.8 10*3/uL — ABNORMAL HIGH (ref 4.0–10.5)
nRBC: 0 % (ref 0.0–0.2)

## 2020-10-21 LAB — ECHOCARDIOGRAM LIMITED
AR max vel: 1.11 cm2
AV Area VTI: 1.46 cm2
AV Area mean vel: 1.35 cm2
AV Mean grad: 15 mmHg
AV Peak grad: 30.3 mmHg
Ao pk vel: 2.75 m/s
Height: 59 in
Weight: 1606.4 oz

## 2020-10-21 LAB — BASIC METABOLIC PANEL
Anion gap: 9 (ref 5–15)
BUN: 19 mg/dL (ref 8–23)
CO2: 20 mmol/L — ABNORMAL LOW (ref 22–32)
Calcium: 8.6 mg/dL — ABNORMAL LOW (ref 8.9–10.3)
Chloride: 103 mmol/L (ref 98–111)
Creatinine, Ser: 0.65 mg/dL (ref 0.44–1.00)
GFR, Estimated: >60 mL/min (ref >60)
Glucose, Bld: 145 mg/dL — ABNORMAL HIGH (ref 70–99)
Potassium: 4.1 mmol/L (ref 3.5–5.1)
Sodium: 132 mmol/L — ABNORMAL LOW (ref 135–145)

## 2020-10-21 LAB — MAGNESIUM: Magnesium: 1.8 mg/dL (ref 1.7–2.4)

## 2020-10-21 MED FILL — Heparin Sodium (Porcine) Inj 1000 Unit/ML: INTRAMUSCULAR | Qty: 30 | Status: AC

## 2020-10-21 MED FILL — Potassium Chloride Inj 2 mEq/ML: INTRAVENOUS | Qty: 40 | Status: AC

## 2020-10-21 MED FILL — Magnesium Sulfate Inj 50%: INTRAMUSCULAR | Qty: 10 | Status: AC

## 2020-10-21 NOTE — Progress Notes (Signed)
CARDIAC REHAB PHASE I   PRE:  Rate/Rhythm: 74 SR 1HB  BP:  Supine: 143/60  Sitting:    Standing:    SaO2: 96%RA  MODE:  Ambulation: 470 ft   POST:  Rate/Rhythm: 104  BP:  Supine:   Sitting: 172/63  Standing:    SaO2: 99%RA 0930-0955 Pt walked 470 ft on RA with hand held asst with steady gait and tolerated well. Encouraged pt to take short walks several times a day as tolerated. Sent referral to [place] [facility] to update. Pt in room with husband.   Graylon Good, RN BSN  10/21/2020 9:51 AM

## 2020-10-21 NOTE — Progress Notes (Signed)
Echocardiogram 2D Echocardiogram has been performed.  Oneal Deputy Senior 10/21/2020, 9:12 AM

## 2020-10-21 NOTE — Discharge Summary (Addendum)
[facility] VALVE TEAM  Discharge Summary    Patient ID: SAHORY Renee Figueroa MRN: [id number]; DOB: 1933/09/27  Admit date: 10/20/2020 Discharge date: 10/21/2020  Primary Care Provider: Binnie Rail, MD  Primary Cardiologist: Dr. Gardiner Rhyme / Dr. Burt Knack & Dr. Roxy Manns (TAVR)  Discharge Diagnoses    Principal Problem:   S/P TAVR (transcatheter aortic valve replacement) Active Problems:   Hyperlipidemia   Essential hypertension   Osteoporosis   Severe aortic stenosis   CAD (coronary artery disease)   Allergies No Known Allergies  Diagnostic Studies/Procedures    TAVR OPERATIVE NOTE   Date of Procedure:                10/20/2020  Preoperative Diagnosis:      Severe Aortic Stenosis   Postoperative Diagnosis:    Same   Procedure:        Transcatheter Aortic Valve Replacement - Percutaneous  Transfemoral Approach             Edwards Sapien 3 THV (size 20 mm, model # [id number], serial # [id number])              Co-Surgeons:                        Valentina Gu. Roxy Manns, MD and Sherren Mocha, MD  Anesthesiologist:                  Belinda Block, MD  Echocardiographer:              Jenkins Rouge, MD  Pre-operative Echo Findings: ? Severe aortic stenosis ? Normal left ventricular systolic function  Post-operative Echo Findings: ? No paravalvular leak ? Normal/unchanged left ventricular systolic function  _____________   Echo 10/21/20:  IMPRESSIONS  1. Left ventricular ejection fraction, by estimation, is 65 to 70%. The  left ventricle has normal function. There is moderate left ventricular  hypertrophy.  2. The mitral valve is abnormal. Mild mitral valve regurgitation.  3. Post TAVR with 20 mm Sapien 3 Ultra no PVL mean gradient 15 peak 30  mmHg with AVA 1.35 cm2 and DVI 0.64 . The aortic valve has been  repaired/replaced.  4. There is normal pulmonary artery systolic pressure.    History of Present Illness     Renee Figueroa is a 87 y.o. female with a history of HTN, HLD, osteoporosis, arthritis and severe AS who presented to [facility] on 10/20/20 for planned TAVR.   Pt reports having a murmur since birth. She was noted to have a louder heart murmur by her PCP recently and underwent 2D echocardiogram on 05/20/2020. This showed a mean gradient across aortic valve of 40 mmHg with a peak gradient of 61 mmHg.  Aortic valve area was 0.9 cm.  Left ventricular ejection fraction was 70 to 75% with hyperdynamic function and mild LVH with grade 1 diastolic dysfunction. There was felt to be severe mid cavitary obstruction with a mid cavitary gradient of 90 mmHg.  She was evaluated by Dr. Burt Knack and was found to have a BNP of 1512. She underwent cardiac catheterization on 08/26/2020 showing severe two-vessel coronary disease with 90% proximal and 80% mid LAD stenosis as well as 80% proximal RCA stenosis.  The mean gradient across the aortic valve was measured at 53 mmHg with a peak to peak gradient of 79 mmHg.  Calculated aortic valve area was 0.48 cm. She was evaluated by Dr. Cyndia Bent who  recommended PCI followed by TAVR.   She was brough back to the cath lab on 10/02/20 for two vessel PCI. She underwent successful PCI of the RCA; however, procedure was complicated by prolonged hypotension following stenting likely related to the presence of very severe aortic stenosis. Her residual severe proximal LAD stenosis requiring atherectomy and PCI was left as Dr. Burt Knack did not feel like it was safe to proceed. Plans were made to proceed with TAVR using a 20 mm Edwards S3U to ensure coronary access later.   The patient has been evaluated by the multidisciplinary valve team and felt to have severe, symptomatic aortic stenosis and to be a suitable candidate for TAVR, which was set up for 10/20/20.   Hospital Course     Consultants: none  Severe AS: s/p successful TAVR with a 20 mm Edwards Sapien 3 Ultra  THV via the TF approach on 10/20/20. Post  operative echo showed EF 65%, normally functioning TAVR with a mean gradient of 15 mmHg and no PVL. Groin sites are stable. ECG with sinus and no high grade heart block. Continue Asprin and plavix. Plan for discharge home today with close follow up in the office next week.   CAD: pre TAVR cath showed severe two-vessel coronary disease with 90% proximal and 80% mid LAD stenosis as well as 80% proximal RCA stenosis. She underwent successful PCI of the RCA on 10/02/20; however, procedure was complicated by prolonged hypotension following stenting likely related to the presence of very severe aortic stenosis. Her residual severe proximal LAD stenosis requiring atherectomy and PCI was left as Dr. Burt Knack did not feel like it was safe to proceed. Plans were made to proceed with TAVR using a 20 mm Edwards S3U to ensure coronary access later. We will discuss timing of this at her 1 month follow up.   Aortic penetrating ulcer: pre TAVR CT showed aortic atherosclerosis with penetrating ulcer measuring up to 8 mm in the infrarenal abdominal aorta. The patient was referred to Dr Donzetta Matters who recommended continued surveillance with aortic duplex in 6 months.  HTN: BP has been noted to be elevated at times. Pt reports a long history of white coat HTN. She will watch her BP at home.   _____________  Discharge Vitals Blood pressure (!) 129/45, pulse 71, temperature 97.9 F (36.6 C), temperature source Oral, resp. rate 16, height 4\' 11"  (1.499 m), weight 45.5 kg, SpO2 98 %.  Filed Weights   10/20/20 0853 10/21/20 0326 10/21/20 0615  Weight: 45.4 kg 45.5 kg 45.5 kg    GEN: Well nourished, well developed, in no acute distress HEENT: normal Neck: no JVD or masses Cardiac: RRR; 2/6 SEM 2 RUSB. No rubs, or gallops,no edema  Respiratory:  clear to auscultation bilaterally, normal work of breathing GI: soft, nontender, nondistended, + BS MS: no deformity or atrophy Skin: warm and dry, no rash.  Groin sites clear without  hematoma or ecchymosis  Neuro:  Alert and Oriented x 3, Strength and sensation are intact Psych: euthymic mood, full affect   Labs & Radiologic Studies    CBC Recent Labs    10/20/20 1540 10/21/20 0156  WBC  --  10.8*  HGB 12.2 10.7*  HCT 36.0 31.0*  MCV  --  89.1  PLT  --  062   Basic Metabolic Panel Recent Labs    10/20/20 1540 10/21/20 0156  NA 140 132*  K 4.1 4.1  CL 105 103  CO2  --  20*  GLUCOSE 113*  145*  BUN 10 19  CREATININE 0.40* 0.65  CALCIUM  --  8.6*  MG  --  1.8   Liver Function Tests No results for input(s): AST, ALT, ALKPHOS, BILITOT, PROT, ALBUMIN in the last 72 hours. No results for input(s): LIPASE, AMYLASE in the last 72 hours. Cardiac Enzymes No results for input(s): CKTOTAL, CKMB, CKMBINDEX, TROPONINI in the last 72 hours. BNP Invalid input(s): POCBNP D-Dimer No results for input(s): DDIMER in the last 72 hours. Hemoglobin A1C No results for input(s): HGBA1C in the last 72 hours. Fasting Lipid Panel No results for input(s): CHOL, HDL, LDLCALC, TRIG, CHOLHDL, LDLDIRECT in the last 72 hours. Thyroid Function Tests No results for input(s): TSH, T4TOTAL, T3FREE, THYROIDAB in the last 72 hours.  Invalid input(s): FREET3 _____________  DG Chest 2 View  Result Date: 10/17/2020 CLINICAL DATA:  Preop valve replacement EXAM: CHEST - 2 VIEW COMPARISON:  10/02/2016 FINDINGS: No focal consolidation. No pleural effusion or pneumothorax. Heart and mediastinal contours are unremarkable. Severe dextroscoliosis of the thoracolumbar spine. IMPRESSION: No active cardiopulmonary disease. Electronically Signed   By: Kathreen Devoid   On: 10/17/2020 12:40   CARDIAC CATHETERIZATION  Result Date: 10/02/2020 1.  Successful PCI of the RCA using a 3.0 x 15 mm resolute Onyx DES, guided by OCT imaging.  Procedure complicated by prolonged hypotension following stenting likely related to the presence of very severe aortic stenosis. 2.  Severe residual proximal to mid LAD  stenosis with heavy calcification.  I had initially planned to treat this with atherectomy and PCI today.  Because of the patient's hemodynamic instability after an uncomplicated RCA intervention, I do not think it is safe to perform atherectomy and PCI on the LAD prior to TAVR.  We will discuss with our team but I would anticipate moving forward with TAVR first. Recommendations: Overnight observation, dual antiplatelet therapy with aspirin and clopidogrel for 6 months, continued evaluation for TAVR in the near future.  VAS US CAROTID  Result Date: 10/17/2020 Carotid Arterial Duplex Study Indications:       Pre-Op Coronary Atherectomy. Risk Factors:      Hypertension, hyperlipidemia, no history of smoking, coronary                    artery disease. Comparison Study:  No previous exam Performing Technologist: Vonzell Schlatter RVT  Examination Guidelines: A complete evaluation includes B-mode imaging, spectral Doppler, color Doppler, and power Doppler as needed of all accessible portions of each vessel. Bilateral testing is considered an integral part of a complete examination. Limited examinations for reoccurring indications may be performed as noted.  Right Carotid Findings: +----------+--------+--------+--------+------------------+--------+           PSV cm/sEDV cm/sStenosisPlaque DescriptionComments +----------+--------+--------+--------+------------------+--------+ CCA Prox  77      12                                         +----------+--------+--------+--------+------------------+--------+ CCA Distal69      14                                         +----------+--------+--------+--------+------------------+--------+ ICA Prox  62      17      1-39%   heterogenous               +----------+--------+--------+--------+------------------+--------+  ICA Distal64      19                                         +----------+--------+--------+--------+------------------+--------+ ECA        65      11                                         +----------+--------+--------+--------+------------------+--------+ +----------+--------+-------+--------+-------------------+           PSV cm/sEDV cmsDescribeArm Pressure (mmHG) +----------+--------+-------+--------+-------------------+ Subclavian102                                        +----------+--------+-------+--------+-------------------+ +---------+--------+--+--------+--+ VertebralPSV cm/s56EDV cm/s16 +---------+--------+--+--------+--+  Left Carotid Findings: +----------+--------+--------+--------+------------------+--------+           PSV cm/sEDV cm/sStenosisPlaque DescriptionComments +----------+--------+--------+--------+------------------+--------+ CCA Prox  119     17                                         +----------+--------+--------+--------+------------------+--------+ CCA Distal84      16                                         +----------+--------+--------+--------+------------------+--------+ ICA Prox  75      17      1-39%   heterogenous               +----------+--------+--------+--------+------------------+--------+ ICA Distal70      17                                         +----------+--------+--------+--------+------------------+--------+ ECA       86      11                                         +----------+--------+--------+--------+------------------+--------+ +----------+--------+--------+--------+-------------------+           PSV cm/sEDV cm/sDescribeArm Pressure (mmHG) +----------+--------+--------+--------+-------------------+ [id number]                                          +----------+--------+--------+--------+-------------------+ +---------+--------+--+--------+--+ VertebralPSV cm/s49EDV cm/s14 +---------+--------+--+--------+--+   Summary: Right Carotid: Velocities in the right ICA are consistent with a 1-39% stenosis. Left  Carotid: Velocities in the left ICA are consistent with a 1-39% stenosis. Vertebrals:  Bilateral vertebral arteries demonstrate antegrade flow. Subclavians: Normal flow hemodynamics were seen in bilateral subclavian              arteries. *See table(s) above for measurements and observations.  Electronically signed by Ruta Hinds MD on 10/17/2020 at 10:43:11 AM.    Final    ECHOCARDIOGRAM LIMITED  Result Date: 10/21/2020    ECHOCARDIOGRAM LIMITED REPORT   Patient Name:   SINCLAIRE ARTIGA Date of  Exam: 10/21/2020 Medical Rec #:  [id number]       Height:       59.0 in Accession #:    [id number]      Weight:       100.4 lb Date of Birth:  11-Jul-1934        BSA:          1.376 m Patient Age:    87 years        BP:           118/60 mmHg Patient Gender: F               HR:           77 bpm. Exam Location:  Inpatient Procedure: Limited Echo, Color Doppler and Cardiac Doppler Indications:    Post TAVR  History:        Patient has prior history of Echocardiogram examinations, most                 recent 10/20/2020. Risk Factors:Hypertension and Dyslipidemia.  Sonographer:    Raquel Sarna Senior RDCS Referring Phys: [id number] [place]  Sonographer Comments: 76mm Edwards Sapien ultra TAVR placed 10/20/20 IMPRESSIONS  1. Left ventricular ejection fraction, by estimation, is 65 to 70%. The left ventricle has normal function. There is moderate left ventricular hypertrophy.  2. The mitral valve is abnormal. Mild mitral valve regurgitation.  3. Post TAVR with 20 mm Sapien 3 Ultra no PVL mean gradient 15 peak 30 mmHg with AVA 1.35 cm2 and DVI 0.64 . The aortic valve has been repaired/replaced.  4. There is normal pulmonary artery systolic pressure. FINDINGS  Left Ventricle: Left ventricular ejection fraction, by estimation, is 65 to 70%. The left ventricle has normal function. There is moderate left ventricular hypertrophy. Right Ventricle: There is normal pulmonary artery systolic pressure. The tricuspid regurgitant velocity is 2.54  m/s, and with an assumed right atrial pressure of 3 mmHg, the estimated right ventricular systolic pressure is 64.4 mmHg. Mitral Valve: The mitral valve is abnormal. There is mild thickening of the mitral valve leaflet(s). There is mild calcification of the mitral valve leaflet(s). Mild mitral annular calcification. Mild mitral valve regurgitation. Aortic Valve: Post TAVR with 20 mm Sapien 3 Ultra no PVL mean gradient 15 peak 30 mmHg with AVA 1.35 cm2 and DVI 0.64. The aortic valve has been repaired/replaced. Aortic valve mean gradient measures 15.0 mmHg. Aortic valve peak gradient measures 30.2 mmHg. Aortic valve area, by VTI measures 1.46 cm. LEFT VENTRICLE PLAX 2D LVOT diam:     1.70 cm LV SV:         67 LV SV Index:   48 LVOT Area:     2.27 cm  AORTIC VALVE AV Area (Vmax):    1.11 cm AV Area (Vmean):   1.35 cm AV Area (VTI):     1.46 cm AV Vmax:           275.00 cm/s AV Vmean:          175.000 cm/s AV VTI:            0.457 m AV Peak Grad:      30.2 mmHg AV Mean Grad:      15.0 mmHg LVOT Vmax:         134.00 cm/s LVOT Vmean:        104.000 cm/s LVOT VTI:          0.294 m LVOT/AV VTI ratio: 0.64 TRICUSPID VALVE TR Peak grad:  25.8 mmHg TR Vmax:        254.00 cm/s  SHUNTS Systemic VTI:  0.29 m Systemic Diam: 1.70 cm Jenkins Rouge MD Electronically signed by Jenkins Rouge MD Signature Date/Time: 10/21/2020/9:19:15 AM    Final    ECHOCARDIOGRAM LIMITED  Result Date: 10/20/2020    ECHOCARDIOGRAM LIMITED REPORT   Patient Name:   LESHONDA GALAMBOS Date of Exam: 10/20/2020 Medical Rec #:  [id number]       Height:       59.0 in Accession #:    [id number]      Weight:       100.5 lb Date of Birth:  1934/05/31        BSA:          1.377 m Patient Age:    69 years        BP:           179/77 mmHg Patient Gender: F               HR:           94 bpm. Exam Location:  Inpatient Procedure: Limited Echo, Limited Color Doppler and Cardiac Doppler Indications:     Aortic stenosis I35.0  History:         Patient has prior history of  Echocardiogram examinations, most                  recent 05/21/2020. CAD; Risk Factors:Hypertension and                  Dyslipidemia.                  Aortic Valve: 20 mm Sapien prosthetic, stented (TAVR) valve is                  present in the aortic position. Procedure Date: 10/20/2020.  Sonographer:     Carl Best RN Referring Phys:  [place] Diagnosing Phys: Jenkins Rouge MD IMPRESSIONS  1. Left ventricular ejection fraction, by estimation, is 65 to 70%. The left ventricle has normal function. There is moderate left ventricular hypertrophy.  2. Right ventricular systolic function is normal. The right ventricular size is normal.  3. Left atrial size was mildly dilated.  4. Pre TAVR Tri leaflet severely calcified with severe AS peak velocity 4.3 m/sec mean gradient 47 mmHg peak 74 mmHg AVA 0.8 cm 2         Post TAVR: well positioned 20 mm Sapien 3 valve no PVL mean gradient 6 mmHg peak 10 mmHg AVA 2.4 cm2. There is a 20 mm Sapien prosthetic (TAVR) valve present in the aortic position. Procedure Date: 10/20/2020. FINDINGS  Left Ventricle: Left ventricular ejection fraction, by estimation, is 65 to 70%. The left ventricle has normal function. There is moderate left ventricular hypertrophy. Right Ventricle: The right ventricular size is normal. No increase in right ventricular wall thickness. Right ventricular systolic function is normal. Left Atrium: Left atrial size was mildly dilated. Mitral Valve: There is mild thickening of the mitral valve leaflet(s). Mild mitral annular calcification. Aortic Valve: Pre TAVR Tri leaflet severely calcified with severe AS peak velocity 4.3 m/sec mean gradient 47 mmHg peak 74 mmHg AVA 0.8 cm 2 Post TAVR: well positioned 20 mm Sapien 3 valve no PVL mean gradient 6 mmHg peak 10 mmHg AVA 2.4 cm2. Aortic valve mean gradient measures 49.0 mmHg. Aortic valve peak gradient measures 73.3 mmHg. Aortic valve area, by VTI measures 0.81  cm. There is a 20  mm Sapien prosthetic,  stented (TAVR) valve present in the aortic position. Procedure Date: 10/20/2020. LEFT VENTRICLE PLAX 2D LVOT diam:     2.00 cm LV SV:         75 LV SV Index:   55 LVOT Area:     3.14 cm  AORTIC VALVE AV Area (Vmax):    0.73 cm AV Area (Vmean):   0.71 cm AV Area (VTI):     0.81 cm AV Vmax:           428.00 cm/s AV Vmean:          327.000 cm/s AV VTI:            0.927 m AV Peak Grad:      73.3 mmHg AV Mean Grad:      49.0 mmHg LVOT Vmax:         98.80 cm/s LVOT Vmean:        74.300 cm/s LVOT VTI:          0.240 m LVOT/AV VTI ratio: 0.26  SHUNTS Systemic VTI:  0.24 m Systemic Diam: 2.00 cm Jenkins Rouge MD Electronically signed by Jenkins Rouge MD Signature Date/Time: 10/20/2020/2:38:22 PM    Final    Structural Heart Procedure  Result Date: 10/20/2020 See surgical note for result.  Disposition   Pt is being discharged home today in good condition.  Follow-up Plans & Appointments     Follow-up Information    Eileen Stanford, PA-C. Go on 10/29/2020.   Specialties: Cardiology, Radiology Why: @ 2:30pm, please arrive at least 10 minutes early.  Contact information: [place] [phone]              Discharge Instructions    Amb Referral to Cardiac Rehabilitation   Complete by: As directed    Diagnosis: Valve Replacement   Valve: Aortic Comment - TAVR   After initial evaluation and assessments completed: Virtual Based Care may be provided alone or in conjunction with Phase 2 Cardiac Rehab based on patient barriers.: Yes      Discharge Medications   Allergies as of 10/21/2020   No Known Allergies     Medication List    TAKE these medications   acetaminophen 500 MG tablet Commonly known as: TYLENOL Take 1,000 mg by mouth every 6 (six) hours as needed for moderate pain.   aspirin EC 81 MG tablet Take 1 tablet (81 mg total) by mouth daily. Swallow whole.   CALCIUM & VIT D3 BONE HEALTH PO Take 2 each by mouth daily. Chewables    carboxymethylcellulose 0.5 % Soln Commonly known as: REFRESH PLUS Place 1 drop into both eyes 3 (three) times daily as needed (dry eyes).   clopidogrel 75 MG tablet Commonly known as: PLAVIX Take 1 tablet (75 mg total) by mouth daily.   fluticasone 50 MCG/ACT nasal spray Commonly known as: FLONASE Place 1 spray into both nostrils daily as needed for allergies or rhinitis.   losartan 50 MG tablet Commonly known as: COZAAR Take 1 tablet (50 mg total) by mouth daily.   multivitamin with minerals tablet Take 2 tablets by mouth daily. Chewables   rosuvastatin 10 MG tablet Commonly known as: Crestor Take 1 tablet (10 mg total) by mouth daily.         Outstanding Labs/Studies   none  Duration of Discharge Encounter   Greater than 30 minutes including physician time.  Signed, Curt Bears  Billee Cashing 10/21/2020, 11:16 AM [phone]  Patient seen, examined. Available data reviewed. Agree with findings, assessment, and plan as outlined by [name], PA-C.  The patient is independently interviewed and examined.  Heart is regular rate and rhythm with a soft systolic ejection murmur at the right upper sternal border.  No diastolic murmur.  Her echo is reviewed and shows normal transcatheter heart valve function.  Bilateral groin sites are clear.  She is medically stable for discharge on medication as outlined above.  When we see her back in office follow-up, we will determine timing of LAD intervention versus ongoing medical therapy.  Sherren Mocha, M.D. 10/21/2020 6:00 PM

## 2020-10-21 NOTE — Progress Notes (Signed)
Mobility Specialist - Progress Note   10/21/20 1103  Mobility  Activity Ambulated in hall  Level of Assistance Standby assist, set-up cues, supervision of patient - no hands on  Assistive Device None  Distance Ambulated (ft) 470 ft  Mobility Response Tolerated well  Mobility performed by Mobility specialist  $Mobility charge 1 Mobility   Pre-mobility: 83 HR During mobility: 107 HR Post-mobility: 81 HR  Pt asx throughout ambulation. Pt sitting up on edge of bed after walk, husband in room.   Pricilla Handler Mobility Specialist Mobility Specialist Phone: [phone]

## 2020-10-21 NOTE — Anesthesia Postprocedure Evaluation (Signed)
Anesthesia Post Note  Patient: Renee Figueroa  Procedure(s) Performed: TRANSCATHETER AORTIC VALVE REPLACEMENT, TRANSFEMORAL (N/A Groin) TRANSESOPHAGEAL ECHOCARDIOGRAM (TEE) (N/A Mouth) ULTRASOUND GUIDANCE FOR VASCULAR ACCESS (Bilateral Groin)     Patient location during evaluation: Cath Lab Anesthesia Type: MAC Level of consciousness: awake and alert Pain management: pain level controlled Vital Signs Assessment: post-procedure vital signs reviewed and stable Respiratory status: spontaneous breathing, nonlabored ventilation, respiratory function stable and patient connected to nasal cannula oxygen Cardiovascular status: stable and blood pressure returned to baseline Postop Assessment: no apparent nausea or vomiting Anesthetic complications: no   No complications documented.  Last Vitals:  Vitals:   10/21/20 0800 10/21/20 1216  BP: (!) 129/45 (!) 141/59  Pulse: 71 61  Resp:  14  Temp: 36.6 C   SpO2: 98% 98%    Last Pain:  Vitals:   10/21/20 1216  TempSrc: Oral  PainSc:                  [place]

## 2020-10-21 NOTE — Progress Notes (Signed)
Patient and spouse given discharge instructions medication list, and follow up appointments. IV and tele were dcd. Will discharge home as ordered. Transported to exit via wheel chair and hospital staff. Safira Proffit, Bettina Gavia RN

## 2020-10-22 ENCOUNTER — Telehealth: Payer: Self-pay | Admitting: Physician Assistant

## 2020-10-22 NOTE — Telephone Encounter (Signed)
  [place] VALVE TEAM   Patient contacted regarding discharge from [facility] on 3/2  Patient understands to follow up with provider [name] on 3/10  at [facility].  Patient understands discharge instructions? yes Patient understands medications and regimen? yes Patient understands to bring all medications to this visit? yes  Angelena Form PA-C  [facility]

## 2020-10-28 ENCOUNTER — Telehealth (HOSPITAL_COMMUNITY): Payer: Self-pay

## 2020-10-28 NOTE — Telephone Encounter (Signed)
Attempted to call pt and a gentleman answered the phone and stated that pt was tied up right then and couldn't take a call, he asked could we call back. Advised gentleman I would.

## 2020-10-28 NOTE — Telephone Encounter (Signed)
Pt insurance is active and benefits verified through [facility] Medicare Co-pay 0, DED 0/0 met, out of pocket $3,600/$895 met, co-insurance 0%. no pre-authorization required. Passport, 10/28/2020'@3' :26pm, REF# [id number]  Will contact patient to see if she is interested in the Cardiac Rehab Program. If interested, patient will need to complete follow up appt. Once completed, patient will be contacted for scheduling upon review by the RN Navigator.

## 2020-10-29 ENCOUNTER — Ambulatory Visit: Payer: Medicare Other | Admitting: Physician Assistant

## 2020-10-29 ENCOUNTER — Encounter: Payer: Self-pay | Admitting: Physician Assistant

## 2020-10-29 ENCOUNTER — Other Ambulatory Visit: Payer: Self-pay

## 2020-10-29 VITALS — BP 162/90 | HR 90 | Ht 59.0 in | Wt 100.2 lb

## 2020-10-29 DIAGNOSIS — I719 Aortic aneurysm of unspecified site, without rupture: Secondary | ICD-10-CM

## 2020-10-29 DIAGNOSIS — I10 Essential (primary) hypertension: Secondary | ICD-10-CM | POA: Diagnosis not present

## 2020-10-29 DIAGNOSIS — I2510 Atherosclerotic heart disease of native coronary artery without angina pectoris: Secondary | ICD-10-CM | POA: Diagnosis not present

## 2020-10-29 DIAGNOSIS — Z952 Presence of prosthetic heart valve: Secondary | ICD-10-CM

## 2020-10-29 MED ORDER — AMOXICILLIN 500 MG PO TABS: ORAL_TABLET | ORAL | 11 refills | Status: DC

## 2020-10-29 NOTE — Patient Instructions (Signed)
Medication Instructions:  Your provider discussed the importance of taking an antibiotic prior to all dental visits to prevent damage to the heart valves from infection. You were given a prescription for AMOXIL 2,000 mg to take one hour prior to any dental appointment.  *If you need a refill on your cardiac medications before your next appointment, please call your pharmacy*  Follow-Up: Please keep your follow-up as planned!

## 2020-10-29 NOTE — Progress Notes (Signed)
[facility]                                     Cardiology Office Note:    Date:  10/29/2020   ID:  Renee Figueroa, DOB July 14, 1934, MRN [id number]  PCP:  Binnie Rail, MD  [facility] Cardiologist:  Donato Heinz, MD / Dr. Burt Knack & Dr. Roxy Manns (TAVR) [facility] Electrophysiologist:  None   Referring MD: Binnie Rail, MD     History of Present Illness:    Renee Figueroa is a 87 y.o. female with a hx of HTN, HLD, osteoporosis, arthritis and severe AS s/p TAVR (10/20/20) who presents to clinic for follow up.   Pt reports having a murmur since birth. She was noted to have a louder heart murmur by her PCP recently and underwent 2D echocardiogram on 05/20/2020. This showed a mean gradient across aortic valve of 40 mmHg with a peak gradient of 61 mmHg. Aortic valve area was 0.9 cm. Left ventricular ejection fraction was 70 to 75% with hyperdynamic function and mild LVH with grade 1 diastolic dysfunction. There was felt to be severe mid cavitary obstruction with a mid cavitary gradient of 90 mmHg.She was evaluated by Dr. Burt Knack and was found to have a BNP of 1512. She underwent cardiac catheterization on 08/26/2020 showing severe two-vessel coronary disease with 90% proximal and 80% mid LAD stenosis as well as 80% proximal RCA stenosis. The mean gradient across the aortic valve was measured at 53 mmHg with a peak to peak gradient of 79 mmHg. Calculated aortic valve area was 0.48 cm. She was evaluated by Dr. Cyndia Bent who recommended PCI followed by TAVR.   She was brough back to the cath lab on 10/02/20 for two vessel PCI. She underwent successful PCI of the RCA; however, procedure was complicated by prolonged hypotension following stenting likely related to the presence of very severe aortic stenosis. Her residual severe proximal LAD stenosis requiring atherectomy and PCI was left as Dr. Burt Knack did not feel like it was safe to  proceed in the setting of severe AS. Plans were made to proceed with TAVR using a 20 mm Edwards S3U to ensure coronary access later.   She was evaluated by the multidisciplinary valve team and underwent successful TAVR with a 20 mm Edwards Sapien 3 Ultra  THV via the TF approach on 10/20/20. Post operative echo showed EF 65%, normally functioning TAVR with a mean gradient of 15 mmHg and no PVL. She was continued on aspirin and plavix.   Today she presents to clinic for follow up. Here with her husband. Doing quite well. No CP or SOB. No LE edema, orthopnea or PND. No dizziness or syncope. No blood in stool or urine. No palpitations. Has periodically taken BPs at home and usually in 417E systolic but sometimes higher.   Past Medical History:  Diagnosis Date  . Allergy    seasonal  . Arthritis    mild  . Basal cell carcinoma of anterior chest    skin  . Colon polyps    adenomatous  . Coronary artery disease   . Diverticulosis   . Heart murmur   . Herpes zoster 04/2011   C 6  LUE  . Hyperlipidemia   . Hypertension   . Osteoporosis   . Renal calculi 1962    X 1   .  S/P TAVR (transcatheter aortic valve replacement) 10/20/2020   s/p TAVR with a 20 mm Edwards S3U via the TF approach by Drs Burt Knack and Roxy Manns    Past Surgical History:  Procedure Laterality Date  . CARDIAC CATHETERIZATION    . cataract    . colonoscopy with polypectomy  2004    adenomatous; Dr Henrene Pastor. Neg 2013  . CORONARY ATHERECTOMY  10/02/2020  . CORONARY STENT INTERVENTION N/A 10/02/2020   Procedure: CORONARY STENT INTERVENTION;  Surgeon: Sherren Mocha, MD;  Location: [facility];  Service: Cardiovascular;  Laterality: N/A;  . DILATION AND CURETTAGE OF UTERUS     age 55  . EYE SURGERY    . G 2 P 2    . INTRAVASCULAR ULTRASOUND/IVUS N/A 10/02/2020   Procedure: Intravascular Ultrasound/IVUS;  Surgeon: Sherren Mocha, MD;  Location: [place] [facility];  Service: Cardiovascular;  Laterality: N/A;  . RIGHT/LEFT  HEART CATH AND CORONARY ANGIOGRAPHY N/A 08/26/2020   Procedure: RIGHT/LEFT HEART CATH AND CORONARY ANGIOGRAPHY;  Surgeon: Sherren Mocha, MD;  Location: [place] [facility];  Service: Cardiovascular;  Laterality: N/A;  . TEE WITHOUT CARDIOVERSION N/A 10/20/2020   Procedure: TRANSESOPHAGEAL ECHOCARDIOGRAM (TEE);  Surgeon: Sherren Mocha, MD;  Location: [place];  Service: Open Heart Surgery;  Laterality: N/A;  . TONSILLECTOMY AND ADENOIDECTOMY    . TRANSCATHETER AORTIC VALVE REPLACEMENT, TRANSFEMORAL N/A 10/20/2020   Procedure: TRANSCATHETER AORTIC VALVE REPLACEMENT, TRANSFEMORAL;  Surgeon: Sherren Mocha, MD;  Location: [place];  Service: Open Heart Surgery;  Laterality: N/A;  . ULTRASOUND GUIDANCE FOR VASCULAR ACCESS Bilateral 10/20/2020   Procedure: ULTRASOUND GUIDANCE FOR VASCULAR ACCESS;  Surgeon: Sherren Mocha, MD;  Location: [place];  Service: Open Heart Surgery;  Laterality: Bilateral;    Current Medications: Current Meds  Medication Sig  . acetaminophen (TYLENOL) 500 MG tablet Take 1,000 mg by mouth every 6 (six) hours as needed for moderate pain.  Marland Kitchen amoxicillin (AMOXIL) 500 MG tablet Take 4 tablets (2,000 mg) one hour prior to all dental visits.  Marland Kitchen aspirin EC 81 MG tablet Take 1 tablet (81 mg total) by mouth daily. Swallow whole.  . carboxymethylcellulose (REFRESH PLUS) 0.5 % SOLN Place 1 drop into both eyes 3 (three) times daily as needed (dry eyes).  . clopidogrel (PLAVIX) 75 MG tablet Take 1 tablet (75 mg total) by mouth daily.  . fluticasone (FLONASE) 50 MCG/ACT nasal spray Place 1 spray into both nostrils daily as needed for allergies or rhinitis.  Marland Kitchen losartan (COZAAR) 50 MG tablet Take 1 tablet (50 mg total) by mouth daily.  . Multiple Minerals-Vitamins (CALCIUM & VIT D3 BONE HEALTH PO) Take 2 each by mouth daily. Chewables  . Multiple Vitamins-Minerals (MULTIVITAMIN WITH MINERALS) tablet Take 2 tablets by mouth daily. Chewables  . rosuvastatin (CRESTOR) 10 MG tablet Take 1 tablet (10 mg  total) by mouth daily.     Allergies:   Patient has no known allergies.   Social History   Socioeconomic History  . Marital status: Married    Spouse name: Not on file  . Number of children: 2  . Years of education: Not on file  . Highest education level: Not on file  Occupational History  . Not on file  Tobacco Use  . Smoking status: Never Smoker  . Smokeless tobacco: Never Used  Vaping Use  . Vaping Use: Never used  Substance and Sexual Activity  . Alcohol use: Yes    Comment: maybe 1 drink a month  . Drug use: No  .  Sexual activity: Not Currently  Other Topics Concern  . Not on file  Social History Narrative  . Not on file   Social Determinants of Health   Financial Resource Strain: Not on file  Food Insecurity: Not on file  Transportation Needs: Not on file  Physical Activity: Not on file  Stress: Not on file  Social Connections: Not on file     Family History: The patient's family history includes Heart attack (age of onset: 58) in her father; Hypertension in her mother; Stomach cancer in her mother. There is no history of Colon cancer, Colon polyps, Rectal cancer, Diabetes, or Stroke.  ROS:   Please see the history of present illness.    All other systems reviewed and are negative.  EKGs/Labs/Other Studies Reviewed:    The following studies were reviewed today:   TAVR OPERATIVE NOTE   Date of Procedure:10/20/2020  Preoperative Diagnosis:Severe Aortic Stenosis   Postoperative Diagnosis:Same   Procedure:   Transcatheter Aortic Valve Replacement - Percutaneous Transfemoral Approach Edwards Sapien 3 THV (size 23mm, model # [id number], serial #[id number])  Co-Surgeons:Clarence H. Roxy Manns, MD and Sherren Mocha, MD  Anesthesiologist:Charlene Nyoka Cowden, MD  Echocardiographer:Peter Johnsie Cancel, MD  Pre-operative Echo Findings: ? Severe aortic  stenosis ? Normalleft ventricular systolic function  Post-operative Echo Findings: ? Noparavalvular leak ? Normal/unchangedleft ventricular systolic function  _____________   Echo 10/21/20:  IMPRESSIONS  1. Left ventricular ejection fraction, by estimation, is 65 to 70%. The  left ventricle has normal function. There is moderate left ventricular  hypertrophy.  2. The mitral valve is abnormal. Mild mitral valve regurgitation.  3. Post TAVR with 20 mm Sapien 3 Ultra no PVL mean gradient 15 peak 30  mmHg with AVA 1.35 cm2 and DVI 0.64 . The aortic valve has been  repaired/replaced.  4. There is normal pulmonary artery systolic pressure.     EKG:  EKG is ordered today.  The ekg ordered today demonstrates sinus HR 90  Recent Labs: 05/04/2020: TSH 2.03 07/22/2020: NT-Pro BNP 1,512 10/16/2020: ALT 18 10/21/2020: BUN 19; Creatinine, Ser 0.65; Hemoglobin 10.7; Magnesium 1.8; Platelets 182; Potassium 4.1; Sodium 132  Recent Lipid Panel    Component Value Date/Time   CHOL 266 (H) 05/04/2020 1007   TRIG 111 05/04/2020 1007   HDL 79 05/04/2020 1007   CHOLHDL 3.4 05/04/2020 1007   VLDL 23.4 05/02/2019 0951   LDLCALC 164 (H) 05/04/2020 1007   LDLDIRECT 156.8 10/24/2012 1207     Risk Assessment/Calculations:       Physical Exam:    VS:  BP (!) 162/90   Pulse 90   Ht 4\' 11"  (1.499 m)   Wt 100 lb 3.2 oz (45.5 kg)   SpO2 97%   BMI 20.24 kg/m     Wt Readings from Last 3 Encounters:  10/29/20 100 lb 3.2 oz (45.5 kg)  10/21/20 100 lb 6.4 oz (45.5 kg)  10/16/20 100 lb 8 oz (45.6 kg)     GEN:  Well nourished, well developed in no acute distress HEENT: Normal NECK: No JVD; No carotid bruits LYMPHATICS: No lymphadenopathy CARDIAC: RRR, soft murmur. no rubs, gallops RESPIRATORY:  Clear to auscultation without rales, wheezing or rhonchi  ABDOMEN: Soft, non-tender, non-distended MUSCULOSKELETAL:  No edema; No deformity  SKIN: Warm and dry.  Groin sites clear without  hematoma or ecchymosis  NEUROLOGIC:  Alert and oriented x 3 PSYCHIATRIC:  Normal affect   ASSESSMENT:    1. S/P TAVR (transcatheter aortic valve replacement)   2. Coronary artery  disease involving native coronary artery of native heart without angina pectoris   3. Penetrating ulcer of aorta ([place])   4. Essential hypertension    PLAN:    In order of problems listed above:  Severe AS s/p TAVR:doing well. Groin sites healing well. ECG with no HAVB. Continue on aspirin and plavix. SBE prophylaxis discussed; I have RX'd amoxicillin. I will see her back at 1 month for follow up and echo.   CAD: pre TAVR cath showed severe two-vessel coronary disease with 90% proximal and 80% mid LAD stenosis as well as 80% proximal RCA stenosis. She underwent successful PCI of the RCA on 10/02/20; however, procedure was complicated by prolonged hypotension following stenting likely related to the presence of very severe aortic stenosis. Her residual severe proximal LAD stenosis requiring atherectomy and PCI was left as Dr. Burt Knack did not feel like it was safe to proceed. Plans were made to proceed with TAVR using a 20 mm Edwards S3U to ensure coronary access later. We will discuss timing of this at her 1 month follow up.   Aortic penetrating ulcer: pre TAVR CT showed aortic atherosclerosis with penetrating ulcer measuring up to 8 mm in the infrarenal abdominal aorta. The patient was referred to Dr Donzetta Matters who recommended continued surveillance with aortic duplex in 6 months.  HTN: BP persistently elevated today. Pt reports a long history of white coat HTN.  Has periodically taken BPs at home and usually in 974B systolic but sometimes higher. She will keep a log for BPs for me and bring to 1 month apt      Medication Adjustments/Labs and Tests Ordered: Current medicines are reviewed at length with the patient today.  Concerns regarding medicines are outlined above.  Orders Placed This Encounter  Procedures  .  EKG 12-Lead   Meds ordered this encounter  Medications  . amoxicillin (AMOXIL) 500 MG tablet    Sig: Take 4 tablets (2,000 mg) one hour prior to all dental visits.    Dispense:  12 tablet    Refill:  11    Patient Instructions  Medication Instructions:  Your provider discussed the importance of taking an antibiotic prior to all dental visits to prevent damage to the heart valves from infection. You were given a prescription for AMOXIL 2,000 mg to take one hour prior to any dental appointment.  *If you need a refill on your cardiac medications before your next appointment, please call your pharmacy*  Follow-Up: Please keep your follow-up as planned!    Signed, Angelena Form, PA-C  10/29/2020 2:45 PM    [facility]

## 2020-11-19 ENCOUNTER — Other Ambulatory Visit (HOSPITAL_COMMUNITY): Payer: Medicare Other

## 2020-11-19 ENCOUNTER — Ambulatory Visit: Payer: Medicare Other | Admitting: Physician Assistant

## 2020-11-23 DIAGNOSIS — H52203 Unspecified astigmatism, bilateral: Secondary | ICD-10-CM | POA: Diagnosis not present

## 2020-11-23 DIAGNOSIS — H40053 Ocular hypertension, bilateral: Secondary | ICD-10-CM | POA: Diagnosis not present

## 2020-11-23 DIAGNOSIS — H524 Presbyopia: Secondary | ICD-10-CM | POA: Diagnosis not present

## 2020-11-23 DIAGNOSIS — Z961 Presence of intraocular lens: Secondary | ICD-10-CM | POA: Diagnosis not present

## 2020-11-23 NOTE — Progress Notes (Signed)
[facility] [place]                                     Cardiology Office Note:    Date:  11/25/2020   ID:  Renee Figueroa, DOB 12/02/1933, MRN [id number]  PCP:  Binnie Rail, MD  [facility] Cardiologist:  Donato Heinz, MD / Dr. Burt Knack & Dr. Roxy Manns (TAVR) [facility] Electrophysiologist:  None   Referring MD: Binnie Rail, MD   CC: 1 month s/p TAVR  History of Present Illness:    Renee Figueroa is a 87 y.o. female with a hx of HTN, HLD, osteoporosis, arthritis and severe AS s/p TAVR (10/20/20) who presents to clinic for follow up.   Pt reports having a murmur since birth. She was noted to have a louder heart murmur by her PCP recently and underwent 2D echocardiogram on 05/20/2020. This showed a mean gradient across aortic valve of 40 mmHg with a peak gradient of 61 mmHg. Aortic valve area was 0.9 cm. Left ventricular ejection fraction was 70 to 75% with hyperdynamic function and mild LVH with grade 1 diastolic dysfunction. There was felt to be severe mid cavitary obstruction with a mid cavitary gradient of 90 mmHg.She was evaluated by Dr. Burt Knack and was found to have a BNP of 1512. She underwent cardiac catheterization on 08/26/2020 showing severe two-vessel coronary disease with 90% proximal and 80% mid LAD stenosis as well as 80% proximal RCA stenosis. The mean gradient across the aortic valve was measured at 53 mmHg with a peak to peak gradient of 79 mmHg. Calculated aortic valve area was 0.48 cm. She was evaluated by Dr. Cyndia Bent who recommended PCI followed by TAVR.   She was brough back to the cath lab on 10/02/20 for two vessel PCI. She underwent successful PCI of the RCA; however, procedure was complicated by prolonged hypotension following stenting likely related to the presence of very severe aortic stenosis. Her residual severe proximal LAD stenosis requiring atherectomy and PCI was left as Dr. Burt Knack did not feel like  it was safe to proceed in the setting of severe AS. Plans were made to proceed with TAVR using a 20 mm Edwards S3U to ensure coronary access later.   She was evaluated by the multidisciplinary valve team and underwent successful TAVR with a 20 mm Edwards Sapien 3 Ultra  THV via the TF approach on 10/20/20. Post operative echo showed EF 65%, normally functioning TAVR with a mean gradient of 15 mmHg and no PVL. She was continued on aspirin and plavix. She has done well in follow up.   Today she presents to clinic for follow up. Here with husband. No CP or SOB. No LE edema, orthopnea or PND. No dizziness or syncope. No blood in stool or urine. No palpitations.  Able to do all the things she needs to do without limitation. Stays very active. Not excited about the idea of getting another heart procedure.    Past Medical History:  Diagnosis Date  . Allergy    seasonal  . Arthritis    mild  . Basal cell carcinoma of anterior chest    skin  . Colon polyps    adenomatous  . Coronary artery disease   . Diverticulosis   . Heart murmur   . Herpes zoster 04/2011   C 6  LUE  .  Hyperlipidemia   . Hypertension   . Osteoporosis   . Renal calculi 1962    X 1   . S/P TAVR (transcatheter aortic valve replacement) 10/20/2020   s/p TAVR with a 20 mm Edwards S3U via the TF approach by Drs Burt Knack and Roxy Manns    Past Surgical History:  Procedure Laterality Date  . CARDIAC CATHETERIZATION    . cataract    . colonoscopy with polypectomy  2004    adenomatous; Dr Henrene Pastor. Neg 2013  . CORONARY ATHERECTOMY  10/02/2020  . CORONARY STENT INTERVENTION N/A 10/02/2020   Procedure: CORONARY STENT INTERVENTION;  Surgeon: Sherren Mocha, MD;  Location: [place] [facility];  Service: Cardiovascular;  Laterality: N/A;  . DILATION AND CURETTAGE OF UTERUS     age 65  . EYE SURGERY    . G 2 P 2    . INTRAVASCULAR ULTRASOUND/IVUS N/A 10/02/2020   Procedure: Intravascular Ultrasound/IVUS;  Surgeon: Sherren Mocha, MD;   Location: [place] [facility];  Service: Cardiovascular;  Laterality: N/A;  . RIGHT/LEFT HEART CATH AND CORONARY ANGIOGRAPHY N/A 08/26/2020   Procedure: RIGHT/LEFT HEART CATH AND CORONARY ANGIOGRAPHY;  Surgeon: Sherren Mocha, MD;  Location: [place] [facility];  Service: Cardiovascular;  Laterality: N/A;  . TEE WITHOUT CARDIOVERSION N/A 10/20/2020   Procedure: TRANSESOPHAGEAL ECHOCARDIOGRAM (TEE);  Surgeon: Sherren Mocha, MD;  Location: [place];  Service: Open Heart Surgery;  Laterality: N/A;  . TONSILLECTOMY AND ADENOIDECTOMY    . TRANSCATHETER AORTIC VALVE REPLACEMENT, TRANSFEMORAL N/A 10/20/2020   Procedure: TRANSCATHETER AORTIC VALVE REPLACEMENT, TRANSFEMORAL;  Surgeon: Sherren Mocha, MD;  Location: [place];  Service: Open Heart Surgery;  Laterality: N/A;  . ULTRASOUND GUIDANCE FOR VASCULAR ACCESS Bilateral 10/20/2020   Procedure: ULTRASOUND GUIDANCE FOR VASCULAR ACCESS;  Surgeon: Sherren Mocha, MD;  Location: [place];  Service: Open Heart Surgery;  Laterality: Bilateral;    Current Medications: Current Meds  Medication Sig  . acetaminophen (TYLENOL) 500 MG tablet Take 1,000 mg by mouth every 6 (six) hours as needed for moderate pain.  Marland Kitchen amoxicillin (AMOXIL) 500 MG tablet Take 4 tablets (2,000 mg) one hour prior to all dental visits.  Marland Kitchen aspirin EC 81 MG tablet Take 1 tablet (81 mg total) by mouth daily. Swallow whole.  . carboxymethylcellulose (REFRESH PLUS) 0.5 % SOLN Place 1 drop into both eyes 3 (three) times daily as needed (dry eyes).  . carvedilol (COREG) 6.25 MG tablet Take 1 tablet (6.25 mg total) by mouth 2 (two) times daily.  . clopidogrel (PLAVIX) 75 MG tablet Take 1 tablet (75 mg total) by mouth daily.  Marland Kitchen ezetimibe (ZETIA) 10 MG tablet Take 1 tablet (10 mg total) by mouth daily.  . fluticasone (FLONASE) 50 MCG/ACT nasal spray Place 1 spray into both nostrils daily as needed for allergies or rhinitis.  Marland Kitchen losartan (COZAAR) 50 MG tablet Take 1 tablet (50 mg total) by mouth daily.  .  Multiple Minerals-Vitamins (CALCIUM & VIT D3 BONE HEALTH PO) Take 2 each by mouth daily. Chewables  . Multiple Vitamins-Minerals (MULTIVITAMIN WITH MINERALS) tablet Take 2 tablets by mouth daily. Chewables  . rosuvastatin (CRESTOR) 10 MG tablet Take 1 tablet (10 mg total) by mouth daily.     Allergies:   Patient has no known allergies.   Social History   Socioeconomic History  . Marital status: Married    Spouse name: Not on file  . Number of children: 2  . Years of education: Not on file  . Highest education level: Not on  file  Occupational History  . Not on file  Tobacco Use  . Smoking status: Never Smoker  . Smokeless tobacco: Never Used  Vaping Use  . Vaping Use: Never used  Substance and Sexual Activity  . Alcohol use: Yes    Comment: maybe 1 drink a month  . Drug use: No  . Sexual activity: Not Currently  Other Topics Concern  . Not on file  Social History Narrative  . Not on file   Social Determinants of Health   Financial Resource Strain: Not on file  Food Insecurity: Not on file  Transportation Needs: Not on file  Physical Activity: Not on file  Stress: Not on file  Social Connections: Not on file     Family History: The patient's family history includes Heart attack (age of onset: 68) in her father; Hypertension in her mother; Stomach cancer in her mother. There is no history of Colon cancer, Colon polyps, Rectal cancer, Diabetes, or Stroke.  ROS:   Please see the history of present illness.    All other systems reviewed and are negative.  EKGs/Labs/Other Studies Reviewed:    The following studies were reviewed today:   TAVR OPERATIVE NOTE   Date of Procedure:10/20/2020  Preoperative Diagnosis:Severe Aortic Stenosis   Postoperative Diagnosis:Same   Procedure:   Transcatheter Aortic Valve Replacement - Percutaneous Transfemoral Approach Edwards Sapien 3 THV (size 72mm, model # [id number], serial  #[id number])  Co-Surgeons:Clarence H. Roxy Manns, MD and Sherren Mocha, MD  Anesthesiologist:Charlene Nyoka Cowden, MD  Echocardiographer:Peter Johnsie Cancel, MD  Pre-operative Echo Findings: ? Severe aortic stenosis ? Normalleft ventricular systolic function  Post-operative Echo Findings: ? Noparavalvular leak ? Normal/unchangedleft ventricular systolic function  _____________   Echo 10/21/20:  IMPRESSIONS  1. Left ventricular ejection fraction, by estimation, is 65 to 70%. The  left ventricle has normal function. There is moderate left ventricular  hypertrophy.  2. The mitral valve is abnormal. Mild mitral valve regurgitation.  3. Post TAVR with 20 mm Sapien 3 Ultra no PVL mean gradient 15 peak 30  mmHg with AVA 1.35 cm2 and DVI 0.64 . The aortic valve has been  repaired/replaced.  4. There is normal pulmonary artery systolic pressure.    ____________________  Echo 11/25/20 IMPRESSIONS  1. Left ventricular ejection fraction, by estimation, is 70 to 75%. The left ventricle has hyperdynamic function. The left ventricle has no regional wall motion abnormalities. Left ventricular diastolic parameters are consistent with Grade I diastolic  dysfunction (impaired relaxation). Elevated left ventricular end-diastolic pressure. 3D left ventricular ejection fraction analysis performed but not reported based on interpreter judgement due to suboptimal quality.  2. Right ventricular systolic function is normal. The right ventricular size is normal. There is normal pulmonary artery systolic pressure.  3. The mitral valve is normal in structure. Mild mitral valve regurgitation. No evidence of mitral stenosis.  4. The aortic valve has been repaired/replaced. There is a 20 mm Sapien prosthetic, stented (TAVR) valve present in the aortic position. Aortic valve regurgitation is not visualized. No aortic stenosis is present. Aortic valve mean  gradient measures  18.0 mmHg. Aortic valve Vmax measures 3.08 m/s. DI 0.48.  5. The inferior vena cava is normal in size with greater than 50% respiratory variability, suggesting right atrial pressure of 3 mmHg.  6. Left atrial size was mildly dilated.  7. Compared to prior echo, mean AVG has slightly increased from 15 to 49mmHg, DVI has decreased from 0.64 to 0.48.    EKG:  EKG is NOT ordered today.  Recent Labs: 05/04/2020: TSH 2.03 07/22/2020: NT-Pro BNP 1,512 10/16/2020: ALT 18 10/21/2020: BUN 19; Creatinine, Ser 0.65; Hemoglobin 10.7; Magnesium 1.8; Platelets 182; Potassium 4.1; Sodium 132  Recent Lipid Panel    Component Value Date/Time   CHOL 266 (H) 05/04/2020 1007   TRIG 111 05/04/2020 1007   HDL 79 05/04/2020 1007   CHOLHDL 3.4 05/04/2020 1007   VLDL 23.4 05/02/2019 0951   LDLCALC 164 (H) 05/04/2020 1007   LDLDIRECT 156.8 10/24/2012 1207     Risk Assessment/Calculations:       Physical Exam:    VS:  BP (!) 172/90   Pulse 86   Ht 4\' 11"  (1.499 m)   Wt 92 lb 6.4 oz (41.9 kg)   SpO2 97%   BMI 18.66 kg/m     Wt Readings from Last 3 Encounters:  11/25/20 92 lb 6.4 oz (41.9 kg)  10/29/20 100 lb 3.2 oz (45.5 kg)  10/21/20 100 lb 6.4 oz (45.5 kg)     GEN:  Well nourished, well developed in no acute distress HEENT: Normal NECK: No JVD; No carotid bruits LYMPHATICS: No lymphadenopathy CARDIAC: RRR, 2/6 SEM. no rubs, gallops RESPIRATORY:  Clear to auscultation without rales, wheezing or rhonchi  ABDOMEN: Soft, non-tender, non-distended MUSCULOSKELETAL:  No edema; No deformity  SKIN: Warm and dry.   NEUROLOGIC:  Alert and oriented x 3 PSYCHIATRIC:  Normal affect   ASSESSMENT:    1. S/P TAVR (transcatheter aortic valve replacement)   2. Coronary artery disease involving native coronary artery of native heart without angina pectoris   3. Penetrating ulcer of aorta ([place])   4. Essential hypertension    PLAN:    In order of problems listed above:  Severe AS  s/p TAVR: echo today shows EF 70%, normally functioning TAVR with a mean gradient of 18 mm hg and no PVL. She has NYHA class I symptoms. SBE prophylaxis discussed; I have RX'd amoxicillin. Continue on aspirin and plavix. Can stop plavix in 04/2021. I will see her back in 1 year with an echo.    CAD: pre TAVR cath showed severe two-vessel coronary disease with 90% proximal and 80% mid LAD stenosis as well as 80% proximal RCA stenosis. She underwent successful PCI of the RCA on 10/02/20; however, procedure was complicated by prolonged hypotension following stenting likely related to the presence of very severe aortic stenosis. Her residual severe proximal LAD stenosis requiring atherectomy and PCI was deferred as Dr. Burt Knack did not feel like it was safe to proceed. Plans were made to proceed with TAVR using a 20 mm Edwards S3U to ensure coronary access later. Pt is having no chest pain or dyspnea. She is able to exercise without any limitation. Discussed with Dr. Burt Knack and we will plan to treat LAD medically for now given lack of symptoms and hesitancy to do more invasive cardiac procedures. She will let us know if she develops  symptoms at any point  Aortic penetrating ulcer: pre TAVR CT showed aortic atherosclerosis with penetrating ulcer measuring up to 8 mm in the infrarenal abdominal aorta. The patient was referred to Dr Donzetta Matters who recommended continued surveillance with aortic duplex in 6 months.  HTN: BP quite elevated today @ 172/90 and even higher on my personal recheck. Has been high at home as well average ~ 160/90 with HRs in 60-70s. Continue Losartan 50mg  daily. Will add Coreg 6.25 mg BID. Follow up in the HTN clinic in 2 weeks  HLD: LDL 164 in 05/04/20. Hesitant to  increase Crestor 10mg  daily given chronic arthritis pain and she thinks this is made worse by increased dose of statin. Will add Zetia. Follow up in lipid clinic when she gets follow up for BP.     Medication Adjustments/Labs and  Tests Ordered: Current medicines are reviewed at length with the patient today.  Concerns regarding medicines are outlined above.  Orders Placed This Encounter  Procedures  . AMB Referral to [facility]   Meds ordered this encounter  Medications  . ezetimibe (ZETIA) 10 MG tablet    Sig: Take 1 tablet (10 mg total) by mouth daily.    Dispense:  90 tablet    Refill:  3  . carvedilol (COREG) 6.25 MG tablet    Sig: Take 1 tablet (6.25 mg total) by mouth 2 (two) times daily.    Dispense:  180 tablet    Refill:  3    Patient Instructions  Medication Instructions:  Your physician has recommended you make the following change in your medication:  1-START Zetia 10 mg by mouth daily. 2-START Carvedilol Coreg 6.25 mg by mouth twice daily 3-You can STOP Plavix on 04/22/2021  *If you need a refill on your cardiac medications before your next appointment, please call your pharmacy*  Lab Work: If you have labs (blood work) drawn today and your tests are completely normal, you will receive your results only by: Marland Kitchen MyChart Message (if you have MyChart) OR . A paper copy in the mail If you have any lab test that is abnormal or we need to change your treatment, we will call you to review the results.  Follow-Up: At [facility], you and your health needs are our priority.  As part of our continuing mission to provide you with exceptional heart care, we have created designated Provider Care Teams.  These Care Teams include your primary Cardiologist (physician) and Advanced Practice Providers (APPs -  Physician Assistants and Nurse Practitioners) who all work together to provide you with the care you need, when you need it.  We recommend signing up for the patient portal called "MyChart".  Sign up information is provided on this After Visit Summary.  MyChart is used to connect with patients for Virtual Visits (Telemedicine).  Patients are able to view lab/test results, encounter notes, upcoming  appointments, etc.  Non-urgent messages can be sent to your provider as well.   To learn more about what you can do with MyChart, go to [email or web address].    Your next appointment:   4 month(s)  The format for your next appointment:   In Person  Provider:   You may see Donato Heinz, MD or one of the following Advanced Practice Providers on your designated Care Team:    Rosaria Ferries, PA-C  Jory Sims, DNP, ANP  Other Instructions You have been referred to Blood Pressure and Lipid Clinic.       Signed, Angelena Form, PA-C  11/25/2020 4:45 PM    [facility]

## 2020-11-25 ENCOUNTER — Ambulatory Visit (HOSPITAL_COMMUNITY): Payer: Medicare Other | Attending: Cardiology

## 2020-11-25 ENCOUNTER — Ambulatory Visit: Payer: Medicare Other | Admitting: Physician Assistant

## 2020-11-25 ENCOUNTER — Other Ambulatory Visit: Payer: Self-pay

## 2020-11-25 ENCOUNTER — Encounter: Payer: Self-pay | Admitting: Physician Assistant

## 2020-11-25 VITALS — BP 172/90 | HR 86 | Ht 59.0 in | Wt 92.4 lb

## 2020-11-25 DIAGNOSIS — I719 Aortic aneurysm of unspecified site, without rupture: Secondary | ICD-10-CM

## 2020-11-25 DIAGNOSIS — Z952 Presence of prosthetic heart valve: Secondary | ICD-10-CM

## 2020-11-25 DIAGNOSIS — I10 Essential (primary) hypertension: Secondary | ICD-10-CM

## 2020-11-25 DIAGNOSIS — I2510 Atherosclerotic heart disease of native coronary artery without angina pectoris: Secondary | ICD-10-CM

## 2020-11-25 LAB — ECHOCARDIOGRAM COMPLETE
AR max vel: 0.85 cm2
AV Area VTI: 0.98 cm2
AV Area mean vel: 0.9 cm2
AV Mean grad: 18 mmHg
AV Peak grad: 37.9 mmHg
Ao pk vel: 3.08 m/s
Area-P 1/2: 2.79 cm2
S' Lateral: 1.2 cm

## 2020-11-25 MED ORDER — EZETIMIBE 10 MG PO TABS: 10.0000 mg | ORAL_TABLET | Freq: Every day | ORAL | 3 refills | Status: DC

## 2020-11-25 MED ORDER — CARVEDILOL 6.25 MG PO TABS: 6.2500 mg | ORAL_TABLET | Freq: Two times a day (BID) | ORAL | 3 refills | Status: DC

## 2020-11-25 NOTE — Patient Instructions (Signed)
Medication Instructions:  Your physician has recommended you make the following change in your medication:  1-START Zetia 10 mg by mouth daily. 2-START Carvedilol Coreg 6.25 mg by mouth twice daily 3-You can STOP Plavix on 04/22/2021  *If you need a refill on your cardiac medications before your next appointment, please call your pharmacy*  Lab Work: If you have labs (blood work) drawn today and your tests are completely normal, you will receive your results only by: Marland Kitchen MyChart Message (if you have MyChart) OR . A paper copy in the mail If you have any lab test that is abnormal or we need to change your treatment, we will call you to review the results.  Follow-Up: At [facility], you and your health needs are our priority.  As part of our continuing mission to provide you with exceptional heart care, we have created designated Provider Care Teams.  These Care Teams include your primary Cardiologist (physician) and Advanced Practice Providers (APPs -  Physician Assistants and Nurse Practitioners) who all work together to provide you with the care you need, when you need it.  We recommend signing up for the patient portal called "MyChart".  Sign up information is provided on this After Visit Summary.  MyChart is used to connect with patients for Virtual Visits (Telemedicine).  Patients are able to view lab/test results, encounter notes, upcoming appointments, etc.  Non-urgent messages can be sent to your provider as well.   To learn more about what you can do with MyChart, go to [email or web address].    Your next appointment:   4 month(s)  The format for your next appointment:   In Person  Provider:   You may see Donato Heinz, MD or one of the following Advanced Practice Providers on your designated Care Team:    Rosaria Ferries, PA-C  Jory Sims, DNP, ANP  Other Instructions You have been referred to Blood Pressure and Lipid Clinic.

## 2020-12-08 NOTE — Telephone Encounter (Signed)
No auth required in MD office Per Jenny Reichmann at [facility] Code: [id number]  REF# [id number]

## 2020-12-17 ENCOUNTER — Other Ambulatory Visit: Payer: Self-pay

## 2020-12-17 ENCOUNTER — Ambulatory Visit (INDEPENDENT_AMBULATORY_CARE_PROVIDER_SITE_OTHER): Payer: Medicare Other | Admitting: Pharmacist

## 2020-12-17 ENCOUNTER — Other Ambulatory Visit: Payer: Self-pay | Admitting: Physician Assistant

## 2020-12-17 VITALS — BP 230/90 | HR 67

## 2020-12-17 DIAGNOSIS — G720 Drug-induced myopathy: Secondary | ICD-10-CM

## 2020-12-17 DIAGNOSIS — I10 Essential (primary) hypertension: Secondary | ICD-10-CM

## 2020-12-17 DIAGNOSIS — T466X5A Adverse effect of antihyperlipidemic and antiarteriosclerotic drugs, initial encounter: Secondary | ICD-10-CM | POA: Diagnosis not present

## 2020-12-17 DIAGNOSIS — E782 Mixed hyperlipidemia: Secondary | ICD-10-CM

## 2020-12-17 MED ORDER — AMLODIPINE BESYLATE 5 MG PO TABS: 5.0000 mg | ORAL_TABLET | Freq: Every day | ORAL | 1 refills | Status: DC

## 2020-12-17 MED ORDER — VALSARTAN 320 MG PO TABS: 320.0000 mg | ORAL_TABLET | Freq: Every day | ORAL | 3 refills | Status: DC

## 2020-12-17 NOTE — Progress Notes (Addendum)
Patient ID: Renee Figueroa                 DOB: Nov 11, 1933                      MRN: [id number]     HPI: Renee Figueroa is a 87 y.o. female patient of Dr Gardiner Rhyme referred by [name], PA to PharmD clinic for both lipid and HTN management. PMH is significant for HTN, HLD, severe aortic stenosis s/p TAVR 10/20/20, CAD with cardiac cath 08/26/20 showing severe 2 vessel CAD with 90% prox and 80% mid LAD stenosis as well as 80% prox RCA stenosis, arthritis, and osteoporosis. She underwent PCI to RCA on 10/02/20; procedure was complicated by prolonged hypotension following stenting likely related to very severe aortic stenosis. Residual severe prox LAD stenosis required atherectomy, now s/p successful TAVR procedure. Plan to treat LAD medically for now given lack of symptoms and hesitancy to do more invasive cardiac procedures. HTN was elevated at 172/90 at last visit on 4/6 with home readings elevated 160/90 as well. Pt was started on carvedilol 6.25mg  BID. Ezetimibe also added as statin dose limited by arthritis pain that statin was making worse.  Pt presents today in good spirits with her husband. Reports tolerating carvedilol well. Recently purchased a bicep cuff a few months ago but thinks the cuff is too big for her arm. Home readings in the past month have remained elevated: 162/72, 160/72, 169/81, 143/63, 176/72, 142/62, 153/69, 157/70. HR 57 - 66. Has checked BP at various times of day. Takes meds at 9am or 9pm. Had 1 fall recently due to a misstep outside, did not hit her head. No dizziness or headaches on a regular basis. Takes Tylenol for arthritis which works well. Denies NSAID use.   HCTZ previously discontinued 06/10/20 visit due to concern that diuretic may be causing dehydration and leading to elevated mid cavitary gradient. She was started on losartan instead at that visit.  Has not wanted to increase her statin due to her hip pain - unclear if this is due to her statin or arthritis.  Understandably, she is afraid of having a fall as she also has osteoporosis. She is tolerating addition of ezetimibe well. Most recent lipid panel reflects her on rosuvastatin 5mg  3x per week rather than the dose increase to 10mg  daily.  Current HTN meds: carvedilol 6.25mg  BID, losartan 50mg  daily Previously tried: amlodipine - unclear why this was stopped. Looks like CMA med error on med rec. HCTZ 12.5mg  - stopped 06/10/20 visit due to concern for dehydration BP goal: <130/73mmHg Labs: 10/21/20 - Na 132, K 4.1, SCr 0.65  Current lipid meds: rosuvastatin 10mg  daily, ezetimibe 10mg  daily Previously tried: atorvastatin 10mg  daily - hip pain LDL goal: < 70mg /dL Labs: 04/22/20 - TC 266, HDL 79, TG 111, LDL 164 (rosuvastatin 5mg  3 days a week)  Family History: Heart attack (age of onset: 64) in her father; Hypertension in her mother; Stomach cancer in her mother. There is no history of Colon cancer, Colon polyps, Rectal cancer, Diabetes, or Stroke.  Social History: Denies tobacco, alcohol and illicit drug use.  Diet: Cooks most meals at home. Breakfast - cereal, oatmeal, yogurt parfait, or boiled eggs. Dinner - seafood on Fridays, likes green beans, meatloaf, chicken casserole, tries to limit red meat. Doesn't add salt to food. 1 cup of coffee in the AM, otherwise drinks coffee.  Exercise: Stays active with ADLs, does tire in the afternoon.  Wt  Readings from Last 3 Encounters:  11/25/20 92 lb 6.4 oz (41.9 kg)  10/29/20 100 lb 3.2 oz (45.5 kg)  10/21/20 100 lb 6.4 oz (45.5 kg)   BP Readings from Last 3 Encounters:  11/25/20 (!) 172/90  10/29/20 (!) 162/90  10/21/20 (!) 141/59   Pulse Readings from Last 3 Encounters:  11/25/20 86  10/29/20 90  10/21/20 61    Renal function: CrCl cannot be calculated (Patient's most recent lab result is older than the maximum 21 days allowed.).  Past Medical History:  Diagnosis Date  . Allergy    seasonal  . Arthritis    mild  . Basal cell carcinoma  of anterior chest    skin  . Colon polyps    adenomatous  . Coronary artery disease   . Diverticulosis   . Heart murmur   . Herpes zoster 04/2011   C 6  LUE  . Hyperlipidemia   . Hypertension   . Osteoporosis   . Renal calculi 1962    X 1   . S/P TAVR (transcatheter aortic valve replacement) 10/20/2020   s/p TAVR with a 20 mm Edwards S3U via the TF approach by Drs Burt Knack and Roxy Manns    Current Outpatient Medications on File Prior to Visit  Medication Sig Dispense Refill  . acetaminophen (TYLENOL) 500 MG tablet Take 1,000 mg by mouth every 6 (six) hours as needed for moderate pain.    Marland Kitchen amoxicillin (AMOXIL) 500 MG tablet Take 4 tablets (2,000 mg) one hour prior to all dental visits. 12 tablet 11  . aspirin EC 81 MG tablet Take 1 tablet (81 mg total) by mouth daily. Swallow whole. 150 tablet 2  . carboxymethylcellulose (REFRESH PLUS) 0.5 % SOLN Place 1 drop into both eyes 3 (three) times daily as needed (dry eyes).    . carvedilol (COREG) 6.25 MG tablet Take 1 tablet (6.25 mg total) by mouth 2 (two) times daily. 180 tablet 3  . clopidogrel (PLAVIX) 75 MG tablet Take 1 tablet (75 mg total) by mouth daily. 90 tablet 3  . ezetimibe (ZETIA) 10 MG tablet Take 1 tablet (10 mg total) by mouth daily. 90 tablet 3  . fluticasone (FLONASE) 50 MCG/ACT nasal spray Place 1 spray into both nostrils daily as needed for allergies or rhinitis.    Marland Kitchen losartan (COZAAR) 50 MG tablet Take 1 tablet (50 mg total) by mouth daily. 90 tablet 3  . Multiple Minerals-Vitamins (CALCIUM & VIT D3 BONE HEALTH PO) Take 2 each by mouth daily. Chewables    . Multiple Vitamins-Minerals (MULTIVITAMIN WITH MINERALS) tablet Take 2 tablets by mouth daily. Chewables    . rosuvastatin (CRESTOR) 10 MG tablet Take 1 tablet (10 mg total) by mouth daily. 90 tablet 3   No current facility-administered medications on file prior to visit.    No Known Allergies   Assessment/Plan:  1. Hypertension - BP extremely elevated in office  today and far above goal <130/44mmHg. Pt does report white coat HTN and states the highest her SBP at home runs is 180. Gave pt clonidine 0.2mg  in office which did not improve her BP at all when rechecked 30 minutes later. Pt denies s/sx of stroke which were reviewed today. She is aware to call 911 if she does experience sx of stroke. Discussed with DOD Dr Radford Pax who is ok with pt going home from visit instead of to ED.  There have also been multiple med errors made recently with regards to her BP medications. Her  pharmacy has filled the wrong dose of losartan twice (25mg  instead of 50mg ) and did not have a clear explanation when I called, especially since pt had advised them they were filling the wrong dose and they continued to do so. She also used to take amlodipine 10mg  daily which was removed from her med list 07/07/20 stating "discontinued by provider" by CMA at that [facility] visit when it was not, and pt was supposed to continue on therapy.  Will stop losartan and replace it with higher dose of more potent ARB - valsartan 320mg  daily. Will restart amlodipine at 5mg  daily, and will continue carvedilol 6.25mg  BID (HR ~60). Pt will monitor BP at home and bring in home cuff/readings to next visit with me in 2 weeks.  2. Hyperlipidemia - LDL 164 on most recent check last fall, above goal <70 given recent PCI. Labs reflected pt on rosuvastatin 5mg  3x/week. She is now taking rosuvastatin 10mg  daily as well as ezetimibe 10mg  daily which was started 3 weeks ago. Will recheck fasting lipids in 2 weeks to reassess new baseline. Discussed Repatha today with pt which I'll plan to start at next visit if needed.  Renee Figueroa, PharmD, BCACP, [place] [address] [place], [place] [address] Phone: [phone]; Fax: [phone] 12/17/2020 3:12 PM

## 2020-12-17 NOTE — Patient Instructions (Addendum)
It was nice to meet you today!  Your blood pressure goal is < 130/82mmHg -STOP taking losartan -START taking valsartan 320mg  - 1 tablet once daily at 9am -START taking amlodipine 5mg  - 1 tablet once daily at 9pm -Continue taking carvedilol twice daily - at 9am and 9pm  Your LDL cholesterol is 164 and your goal is < 70 -Continue taking rosuvastatin 10mg  daily and ezetimibe 10mg  daily -We will recheck fasting labs on Wednesday, May 11th  Follow up visit at [place] on Wednesday, May 11th. Come fasting for lab work. Please bring your blood pressure cuff and home readings as well. Take your morning medication at least 1 hour before you come if possible

## 2020-12-21 ENCOUNTER — Other Ambulatory Visit: Payer: Medicare Other | Attending: Critical Care Medicine

## 2020-12-21 DIAGNOSIS — Z20822 Contact with and (suspected) exposure to covid-19: Secondary | ICD-10-CM

## 2020-12-22 ENCOUNTER — Other Ambulatory Visit: Payer: Self-pay | Admitting: Physician Assistant

## 2020-12-22 ENCOUNTER — Other Ambulatory Visit: Payer: Self-pay

## 2020-12-22 ENCOUNTER — Ambulatory Visit (INDEPENDENT_AMBULATORY_CARE_PROVIDER_SITE_OTHER): Payer: Medicare Other

## 2020-12-22 DIAGNOSIS — I10 Essential (primary) hypertension: Secondary | ICD-10-CM

## 2020-12-22 DIAGNOSIS — U071 COVID-19: Secondary | ICD-10-CM

## 2020-12-22 DIAGNOSIS — Z952 Presence of prosthetic heart valve: Secondary | ICD-10-CM

## 2020-12-22 DIAGNOSIS — I2510 Atherosclerotic heart disease of native coronary artery without angina pectoris: Secondary | ICD-10-CM

## 2020-12-22 LAB — NOVEL CORONAVIRUS, NAA: SARS-CoV-2, NAA: DETECTED — AB

## 2020-12-22 MED ORDER — METHYLPREDNISOLONE SODIUM SUCC 125 MG IJ SOLR: 125.0000 mg | Freq: Once | INTRAMUSCULAR | Status: AC | PRN

## 2020-12-22 MED ORDER — EPINEPHRINE 0.3 MG/0.3ML IJ SOAJ: 0.3000 mg | Freq: Once | INTRAMUSCULAR | Status: AC | PRN

## 2020-12-22 MED ORDER — BEBTELOVIMAB 175 MG/2 ML IV (EUA)
175.0000 mg | Freq: Once | INTRAMUSCULAR | Status: AC
  Administered 2020-12-22: 175 mg via INTRAVENOUS

## 2020-12-22 MED ORDER — DIPHENHYDRAMINE HCL 50 MG/ML IJ SOLN: 50.0000 mg | Freq: Once | INTRAMUSCULAR | Status: AC | PRN

## 2020-12-22 MED ORDER — MOLNUPIRAVIR EUA 200MG CAPSULE: 4.0000 | ORAL_CAPSULE | Freq: Two times a day (BID) | ORAL | 0 refills | Status: AC

## 2020-12-22 MED ORDER — FAMOTIDINE IN NACL 20-0.9 MG/50ML-% IV SOLN: 20.0000 mg | Freq: Once | INTRAVENOUS | Status: AC | PRN

## 2020-12-22 MED ORDER — SODIUM CHLORIDE 0.9 % IV SOLN
INTRAVENOUS | Status: DC | PRN
Start: 2020-12-22 — End: 2021-05-06

## 2020-12-22 MED ORDER — ALBUTEROL SULFATE HFA 108 (90 BASE) MCG/ACT IN AERS: 2.0000 | INHALATION_SPRAY | Freq: Once | RESPIRATORY_TRACT | Status: AC | PRN

## 2020-12-22 NOTE — Patient Instructions (Signed)

## 2020-12-22 NOTE — Progress Notes (Signed)
Outpatient Oral COVID Treatment Note  I connected with Renee Figueroa on 12/22/2020/11:14 AM by telephone and verified that I am speaking with the correct person using two identifiers.  I discussed the limitations, risks, security, and privacy concerns of performing an evaluation and management service by telephone and the availability of in person appointments. I also discussed with the patient that there may be a patient responsible charge related to this service. The patient expressed understanding and agreed to proceed.  Patient location: home Provider location: office   Diagnosis: COVID-19 infection  Purpose of visit: Discussion of potential use of Molnupiravir or Paxlovid, a new treatment for mild to moderate COVID-19 viral infection in non-hospitalized patients.   Subjective: Patient is a 87 y.o. female who has been diagnosed with COVID 19 viral infection.  Their symptoms began on 4/30 with sore through and myalgias.    Past Medical History:  Diagnosis Date  . Allergy    seasonal  . Arthritis    mild  . Basal cell carcinoma of anterior chest    skin  . Colon polyps    adenomatous  . Coronary artery disease   . Diverticulosis   . Heart murmur   . Herpes zoster 04/2011   C 6  LUE  . Hyperlipidemia   . Hypertension   . Osteoporosis   . Renal calculi 1962    X 1   . 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  acetaminophen (TYLENOL) 500 MG tablet, Take 1,000 mg by mouth every 6 (six) hours as needed for moderate pain., Disp: , Rfl:  .  amLODipine (NORVASC) 5 MG tablet, TAKE 1 TABLET(5 MG) BY MOUTH DAILY, Disp: 90 tablet, Rfl: 3 .  amoxicillin (AMOXIL) 500 MG tablet, Take 4 tablets (2,000 mg) one hour prior to all dental visits., Disp: 12 tablet, Rfl: 11 .  aspirin EC 81 MG tablet, Take 1 tablet (81 mg total) by mouth  daily. Swallow whole., Disp: 150 tablet, Rfl: 2 .  carboxymethylcellulose (REFRESH PLUS) 0.5 % SOLN, Place 1 drop into both eyes 3 (three) times daily as needed (dry eyes)., Disp: , Rfl:  .  carvedilol (COREG) 6.25 MG tablet, Take 1 tablet (6.25 mg total) by mouth 2 (two) times daily., Disp: 180 tablet, Rfl: 3 .  clopidogrel (PLAVIX) 75 MG tablet, Take 1 tablet (75 mg total) by mouth daily., Disp: 90 tablet, Rfl: 3 .  ezetimibe (ZETIA) 10 MG tablet, Take 1 tablet (10 mg total) by mouth daily., Disp: 90 tablet, Rfl: 3 .  fluticasone (FLONASE) 50 MCG/ACT nasal spray, Place 1 spray into both nostrils daily as needed for allergies or rhinitis., Disp: , Rfl:  .  Multiple Minerals-Vitamins (CALCIUM & VIT D3 BONE HEALTH PO), Take 2 each by mouth daily. Chewables, Disp: , Rfl:  .  Multiple Vitamins-Minerals (MULTIVITAMIN WITH MINERALS) tablet, Take 2 tablets by mouth daily. Chewables, Disp: , Rfl:  .  rosuvastatin (CRESTOR) 10 MG tablet, Take 1 tablet (10 mg total) by mouth daily., Disp: 90 tablet, Rfl: 3 .  valsartan (DIOVAN) 320 MG tablet, Take 1 tablet (320 mg total) by mouth daily., Disp: 90 tablet, Rfl: 3  Objective: Patient sounds stable.  They are in no apparent distress.  Breathing is non labored.  Mood and behavior are normal.  Laboratory Data:  Recent  Results (from the past 2160 hour(s))  CBC     Status: None   Collection Time: 09/29/20 12:00 PM  Result Value Ref Range   WBC 7.5 3.4 - 10.8 x10E3/uL   RBC 4.21 3.77 - 5.28 x10E6/uL   Hemoglobin 12.9 11.1 - 15.9 g/dL   Hematocrit 36.6 34.0 - 46.6 %   MCV 87 79 - 97 fL   MCH 30.6 26.6 - 33.0 pg   MCHC 35.2 31.5 - 35.7 g/dL   RDW 11.8 11.7 - 15.4 %   Platelets 220 150 - 450 [id number]/CW  Basic Metabolic Panel (BMET)     Status: None   Collection Time: 09/29/20 12:00 PM  Result Value Ref Range   Glucose 97 65 - 99 mg/dL   BUN 12 8 - 27 mg/dL   Creatinine, Ser 0.61 0.57 - 1.00 mg/dL   GFR calc non Af Amer 84 >59 mL/min/1.73   GFR calc Af  Amer 96 >59 mL/min/1.73    Comment: **In accordance with recommendations from the NKF-ASN Task force,**   [facility] is in the process of updating its eGFR calculation to the   2021 CKD-EPI creatinine equation that estimates kidney function   without a race variable.    BUN/Creatinine Ratio 20 12 - 28   Sodium 137 134 - 144 mmol/L   Potassium 3.9 3.5 - 5.2 mmol/L   Chloride 100 96 - 106 mmol/L   CO2 23 20 - 29 mmol/L   Calcium 9.7 8.7 - 10.3 mg/dL  SARS CORONAVIRUS 2 (TAT 6-24 HRS) Nasopharyngeal Nasopharyngeal Swab     Status: None   Collection Time: 09/30/20 10:15 AM   Specimen: Nasopharyngeal Swab  Result Value Ref Range   SARS Coronavirus 2 NEGATIVE NEGATIVE    Comment: (NOTE) SARS-CoV-2 target nucleic acids are NOT DETECTED.  The SARS-CoV-2 RNA is generally detectable in upper and lower respiratory specimens during the acute phase of infection. Negative results do not preclude SARS-CoV-2 infection, do not rule out co-infections with other pathogens, and should not be used as the sole basis for treatment or other patient management decisions. Negative results must be combined with clinical observations, patient history, and epidemiological information. The expected result is Negative.  Fact Sheet for Patients: SugarRoll.be  Fact Sheet for Healthcare Providers: https://www.woods-mathews.com/  This test is not yet approved or cleared by the [place] FDA and  has been authorized for detection and/or diagnosis of SARS-CoV-2 by FDA under an Emergency Use Authorization (EUA). This EUA will remain  in effect (meaning this test can be used) for the duration of the COVID-19 declaration under Se ction 564(b)(1) of the Act, 21 U.S.C. section 360bbb-3(b)(1), unless the authorization is terminated or revoked sooner.  Performed at [facility], [place] [address] [place],  [address]   POCT Activated clotting time     Status: None    Collection Time: 10/02/20 10:51 AM  Result Value Ref Range   Activated Clotting Time 386 seconds  Surgical PCR screen     Status: None   Collection Time: 10/02/20  4:06 PM   Specimen: Nasal Mucosa; Nasal Swab  Result Value Ref Range   MRSA, PCR NEGATIVE NEGATIVE   Staphylococcus aureus NEGATIVE NEGATIVE    Comment: (NOTE) The Xpert SA Assay (FDA approved for NASAL specimens in patients 33 years of age and older), is one component of a comprehensive surveillance program. It is not intended to diagnose infection nor to guide or monitor treatment. Performed at [facility],  [address] [place], [place] [address]   Basic metabolic panel     Status: Abnormal   Collection Time: 10/03/20  3:40 AM  Result Value Ref Range   Sodium 137 135 - 145 mmol/L   Potassium 4.1 3.5 - 5.1 mmol/L   Chloride 105 98 - 111 mmol/L   CO2 27 22 - 32 mmol/L   Glucose, Bld 104 (H) 70 - 99 mg/dL    Comment: Glucose reference range applies only to samples taken after fasting for at least 8 hours.   BUN 10 8 - 23 mg/dL   Creatinine, Ser 0.72 0.44 - 1.00 mg/dL   Calcium 9.0 8.9 - 10.3 mg/dL   GFR, Estimated >60 >60 mL/min    Comment: (NOTE) Calculated using the CKD-EPI Creatinine Equation (2021)    Anion gap 5 5 - 15    Comment: Performed at [facility] [address], [place], [place] [address]  CBC     Status: Abnormal   Collection Time: 10/03/20  3:40 AM  Result Value Ref Range   WBC 9.4 4.0 - 10.5 K/uL   RBC 3.91 3.87 - 5.11 MIL/uL   Hemoglobin 12.1 12.0 - 15.0 g/dL   HCT 35.1 (L) 36.0 - 46.0 %   MCV 89.8 80.0 - 100.0 fL   MCH 30.9 26.0 - 34.0 pg   MCHC 34.5 30.0 - 36.0 g/dL   RDW 12.7 11.5 - 15.5 %   Platelets 189 150 - 400 K/uL   nRBC 0.0 0.0 - 0.2 %    Comment: Performed at [facility], [place] [address], [place], [place] [address]  CBC     Status: None   Collection Time: 10/16/20 10:08 AM  Result Value Ref Range   WBC 7.7 4.0 - 10.5 K/uL   RBC 4.42 3.87 - 5.11  MIL/uL   Hemoglobin 13.7 12.0 - 15.0 g/dL   HCT 39.8 36.0 - 46.0 %   MCV 90.0 80.0 - 100.0 fL   MCH 31.0 26.0 - 34.0 pg   MCHC 34.4 30.0 - 36.0 g/dL   RDW 12.6 11.5 - 15.5 %   Platelets 258 150 - 400 K/uL   nRBC 0.0 0.0 - 0.2 %    Comment: Performed at [facility], [place] [address], [place], [place] [address]  Comprehensive metabolic panel     Status: Abnormal   Collection Time: 10/16/20 10:08 AM  Result Value Ref Range   Sodium 134 (L) 135 - 145 mmol/L   Potassium 4.1 3.5 - 5.1 mmol/L   Chloride 102 98 - 111 mmol/L   CO2 22 22 - 32 mmol/L   Glucose, Bld 106 (H) 70 - 99 mg/dL    Comment: Glucose reference range applies only to samples taken after fasting for at least 8 hours.   BUN 8 8 - 23 mg/dL   Creatinine, Ser 0.57 0.44 - 1.00 mg/dL   Calcium 9.5 8.9 - 10.3 mg/dL   Total Protein 7.2 6.5 - 8.1 g/dL   Albumin 4.0 3.5 - 5.0 g/dL   AST 21 15 - 41 U/L   ALT 18 0 - 44 U/L   Alkaline Phosphatase 67 38 - 126 U/L   Total Bilirubin 0.8 0.3 - 1.2 mg/dL   GFR, Estimated >60 >60 mL/min    Comment: (NOTE) Calculated using the CKD-EPI Creatinine Equation (2021)    Anion gap 10 5 - 15    Comment: Performed at [facility]  [address] [place], [place] [address]  Protime-INR     Status: None   Collection Time: 10/16/20 10:08 AM  Result Value Ref Range   Prothrombin Time 12.7 11.4 - 15.2 seconds   INR 1.0 0.8 - 1.2    Comment: (NOTE) INR goal varies based on device and disease states. Performed at [facility], [place] [address] [place], [place] [address]   Urinalysis, Routine w reflex microscopic     Status: None   Collection Time: 10/16/20 10:08 AM  Result Value Ref Range   Color, Urine YELLOW YELLOW   APPearance CLEAR CLEAR   Specific Gravity, Urine 1.006 1.005 - 1.030   pH 7.0 5.0 - 8.0   Glucose, UA NEGATIVE NEGATIVE mg/dL   Hgb urine dipstick NEGATIVE NEGATIVE   Bilirubin Urine NEGATIVE NEGATIVE   Ketones, ur NEGATIVE NEGATIVE mg/dL   Protein, ur  NEGATIVE NEGATIVE mg/dL   Nitrite NEGATIVE NEGATIVE   Leukocytes,Ua NEGATIVE NEGATIVE    Comment: Performed at [facility] [address] [place], [place] [address]  Blood gas, arterial on room air     Status: Abnormal   Collection Time: 10/16/20 10:27 AM  Result Value Ref Range   FIO2 21.00    pH, Arterial 7.408 7.350 - 7.450   pCO2 arterial 37.7 32.0 - 48.0 mmHg   pO2, Arterial 91.4 83.0 - 108.0 mmHg   Bicarbonate 23.3 20.0 - 28.0 mmol/L   Acid-base deficit 0.7 0.0 - 2.0 mmol/L   O2 Saturation 97.2 %   Patient temperature 37.0    Collection site LEFT BRACHIAL    Drawn by [id number]    Sample type ARTERIAL DRAW    Allens test (pass/fail) BRACHIAL ARTERY (A) PASS    Comment: Performed at [facility], [place] [address], [place], [place] [address]  Type and screen     Status: None   Collection Time: 10/16/20 10:27 AM  Result Value Ref Range   ABO/RH(D) A POS    Antibody Screen NEG    Sample Expiration 10/30/2020,2359    Extend sample reason      NO TRANSFUSIONS OR PREGNANCY IN THE PAST 3 MONTHS Performed at [facility], [place] [address] [place], [place] [address]   SARS CORONAVIRUS 2 (TAT 6-24 HRS) Nasopharyngeal Nasopharyngeal Swab     Status: None   Collection Time: 10/17/20 11:08 AM   Specimen: Nasopharyngeal Swab  Result Value Ref Range   SARS Coronavirus 2 NEGATIVE NEGATIVE    Comment: (NOTE) SARS-CoV-2 target nucleic acids are NOT DETECTED.  The SARS-CoV-2 RNA is generally detectable in upper and lower respiratory specimens during the acute phase of infection. Negative results do not preclude SARS-CoV-2 infection, do not rule out co-infections with other pathogens, and should not be used as the sole basis for treatment or other patient management decisions. Negative results must be combined with clinical observations, patient history, and epidemiological information. The expected result is Negative.  Fact Sheet for  Patients: SugarRoll.be  Fact Sheet for Healthcare Providers: https://www.woods-mathews.com/  This test is not yet approved or cleared by the [place] FDA and  has been authorized for detection and/or diagnosis of SARS-CoV-2 by FDA under an Emergency Use Authorization (EUA). This EUA will remain  in effect (meaning this test can be used) for the duration of the COVID-19 declaration under Se ction 564(b)(1) of the Act, 21 U.S.C. section 360bbb-3(b)(1), unless the authorization is terminated or revoked sooner.  Performed at [facility], [place]  [address] [place], [place] [address]   ABO/Rh     Status: None   Collection Time: 10/20/20  9:10 AM  Result Value Ref Range   ABO/RH(D)      A POS Performed at [facility] [address] [place], [place] [address]   ECHOCARDIOGRAM LIMITED     Status: None   Collection Time: 10/20/20 12:45 PM  Result Value Ref Range   AR max vel 0.73 cm2   AV Area VTI 0.81 cm2   AV Mean grad 49.0 mmHg   AV Peak grad 73.3 mmHg   Ao pk vel 4.28 m/s   AV Area mean vel 0.71 cm2  I-STAT, chem 8     Status: Abnormal   Collection Time: 10/20/20 12:53 PM  Result Value Ref Range   Sodium 140 135 - 145 mmol/L   Potassium 3.8 3.5 - 5.1 mmol/L   Chloride 104 98 - 111 mmol/L   BUN 10 8 - 23 mg/dL   Creatinine, Ser 0.40 (L) 0.44 - 1.00 mg/dL   Glucose, Bld 97 70 - 99 mg/dL    Comment: Glucose reference range applies only to samples taken after fasting for at least 8 hours.   Calcium, Ion 1.27 1.15 - 1.40 mmol/L   TCO2 25 22 - 32 mmol/L   Hemoglobin 13.3 12.0 - 15.0 g/dL   HCT 39.0 36.0 - 46.0 %  I-STAT, chem 8     Status: Abnormal   Collection Time: 10/20/20  3:40 PM  Result Value Ref Range   Sodium 140 135 - 145 mmol/L   Potassium 4.1 3.5 - 5.1 mmol/L   Chloride 105 98 - 111 mmol/L   BUN 10 8 - 23 mg/dL   Creatinine, Ser 0.40 (L) 0.44 - 1.00 mg/dL   Glucose, Bld 113 (H) 70 - 99 mg/dL    Comment:  Glucose reference range applies only to samples taken after fasting for at least 8 hours.   Calcium, Ion 1.30 1.15 - 1.40 mmol/L   TCO2 22 22 - 32 mmol/L   Hemoglobin 12.2 12.0 - 15.0 g/dL   HCT 36.0 36.0 - 46.0 %  CBC     Status: Abnormal   Collection Time: 10/21/20  1:56 AM  Result Value Ref Range   WBC 10.8 (H) 4.0 - 10.5 K/uL   RBC 3.48 (L) 3.87 - 5.11 MIL/uL   Hemoglobin 10.7 (L) 12.0 - 15.0 g/dL   HCT 31.0 (L) 36.0 - 46.0 %   MCV 89.1 80.0 - 100.0 fL   MCH 30.7 26.0 - 34.0 pg   MCHC 34.5 30.0 - 36.0 g/dL   RDW 12.6 11.5 - 15.5 %   Platelets 182 150 - 400 K/uL   nRBC 0.0 0.0 - 0.2 %    Comment: Performed at [facility], [place] [address] [place],  [address]  Basic metabolic panel     Status: Abnormal   Collection Time: 10/21/20  1:56 AM  Result Value Ref Range   Sodium 132 (L) 135 - 145 mmol/L   Potassium 4.1 3.5 - 5.1 mmol/L   Chloride 103 98 - 111 mmol/L   CO2 20 (L) 22 - 32 mmol/L   Glucose, Bld 145 (H) 70 - 99 mg/dL    Comment: Glucose reference range applies only to samples taken after fasting for at least 8 hours.   BUN 19 8 - 23 mg/dL   Creatinine, Ser 0.65 0.44 - 1.00 mg/dL   Calcium 8.6 (L) 8.9 -  10.3 mg/dL   GFR, Estimated >60 >60 mL/min    Comment: (NOTE) Calculated using the CKD-EPI Creatinine Equation (2021)    Anion gap 9 5 - 15    Comment: Performed at [facility], [place] [address], [place], [place] [address]  Magnesium     Status: None   Collection Time: 10/21/20  1:56 AM  Result Value Ref Range   Magnesium 1.8 1.7 - 2.4 mg/dL    Comment: Performed at [facility] [address], [place], [place] [address]  ECHOCARDIOGRAM LIMITED     Status: None   Collection Time: 10/21/20  9:11 AM  Result Value Ref Range   Weight 1,606.4 oz   Height 59 in   BP 129/45 mmHg   AR max vel 1.11 cm2   AV Area VTI 1.46 cm2   AV Mean grad 15.0 mmHg   AV Peak grad 30.3 mmHg   Ao pk vel 2.75 m/s   AV Area mean vel 1.35 cm2  ECHOCARDIOGRAM  COMPLETE     Status: None   Collection Time: 11/25/20  3:42 PM  Result Value Ref Range   Area-P 1/2 2.79 cm2   S' Lateral 1.20 cm   AV Area mean vel 0.90 cm2   AR max vel 0.85 cm2   AV Area VTI 0.98 cm2   Ao pk vel 3.08 m/s   AV Mean grad 18.0 mmHg   AV Peak grad 37.9 mmHg  Novel Coronavirus, NAA ([facility])     Status: Abnormal   Collection Time: 12/21/20  1:39 PM   Specimen: Nasopharyngeal(NP) swabs in vial transport medium   Nasopharynge  Testing  Result Value Ref Range   SARS-CoV-2, NAA Detected (A) Not Detected    Comment: Patients who have a positive COVID-19 test result may now have treatment options. Treatment options are available for patients with mild to moderate symptoms and for hospitalized patients. Visit our website at [email or web address] for resources and information. This nucleic acid amplification test was developed and its performance characteristics determined by [employer]. Nucleic acid amplification tests include RT-PCR and TMA. This test has not been FDA cleared or approved. This test has been authorized by FDA under an Emergency Use Authorization (EUA). This test is only authorized for the duration of time the declaration that circumstances exist justifying the authorization of the emergency use of in vitro diagnostic tests for detection of SARS-CoV-2 virus and/or diagnosis of COVID-19 infection under section 564(b)(1) of the Act, 21 U.S.C. 882CMK-3(K) (1), unless the authorization is terminated or revoked sooner. When diagnostic testing is negativ e, the possibility of a false negative result should be considered in the context of a patient's recent exposures and the presence of clinical signs and symptoms consistent with COVID-19. An individual without symptoms of COVID-19 and who is not shedding SARS-CoV-2 virus would expect to have a negative (not detected) result in this assay.      Assessment: 87 y.o. female with  mild/moderate COVID 19 viral infection diagnosed on 5/2 at high risk for progression to severe COVID 19.  Plan:  This patient is a 87 y.o. female that meets the following criteria for Emergency Use Authorization of: Molnupiravir  1. Age >18 yr 2. SARS-COV-2 positive test 3. Symptom onset < 5 days 4. Mild-to-moderate COVID disease with high risk for severe progression to hospitalization or death   I have spoken and communicated the following to the patient or parent/caregiver regarding: 1. Molnupiravir is an unapproved drug that is authorized  for use under an Emergency Use Authorization.  2. There are no adequate, approved, available products for the treatment of COVID-19 in adults who have mild-to-moderate COVID-19 and are at high risk for progressing to severe COVID-19, including hospitalization or death. 3. Other therapeutics are currently authorized. For additional information on all products authorized for treatment or prevention of COVID-19, please see TanEmporium.pl.  4. There are benefits and risks of taking this treatment as outlined in the "Fact Sheet for Patients and Caregivers."  5. "Fact Sheet for Patients and Caregivers" was reviewed with patient. A hard copy will be provided to patient from pharmacy prior to the patient receiving treatment. 6. Patients should continue to self-isolate and use infection control measures (e.g., wear mask, isolate, social distance, avoid sharing personal items, clean and disinfect "high touch" surfaces, and frequent handwashing) according to CDC guidelines.  7. The patient or parent/caregiver has the option to accept or refuse treatment. 8. [facility] has established a pregnancy surveillance program. 9. Females of childbearing potential should use a reliable method of contraception correctly and consistently, as applicable, for the  duration of treatment and for 4 days after the last dose of Molnupiravir. 88. Males of reproductive potential who are sexually active with females of childbearing potential should use a reliable method of contraception correctly and consistently during treatment and for at least 3 months after the last dose. 11. Pregnancy status and risk was assessed. Patient verbalized understanding of precautions.   After reviewing above information with the patient, the patient agrees to receive molnupiravir.  Follow up instructions:    . Take prescription BID x 5 days as directed . Reach out to pharmacist for counseling on medication if desired . For concerns regarding further COVID symptoms please follow up with your PCP or urgent care . For urgent or life-threatening issues, seek care at your local emergency department  The patient was provided an opportunity to ask questions, and all were answered. The patient agreed with the plan and demonstrated an understanding of the instructions.   Script sent to [facility] and opted to pick up RX.  We decided against paxlovid given recent PCI and its potential to decrease plavix levels. Will also place orders for the mab infusion.  The patient was advised to call their PCP or seek an in-person evaluation if the symptoms worsen or if the condition fails to improve as anticipated.   I provided 10 minutes of non face-to-face telephone visit time during this encounter, and > 50% was spent counseling as documented under my assessment & plan.  Angelena Form, PA-C 12/22/2020 /11:14 AM

## 2020-12-22 NOTE — Progress Notes (Signed)
Diagnosis: Covid  Provider:  Marshell Garfinkel, MD  Procedure: Infusion  IV Type: Peripheral, IV Location: L Forearm  Bebtelovimab, Dose: 175mg   Infusion Start Time: 1418  Infusion Stop Time: 1419  Post Infusion IV Care: Observation period completed and Peripheral IV Discontinued  Discharge: Condition: Good, Destination: Home . AVS provided to patient.   Performed by:  Arnoldo Morale, RN

## 2020-12-22 NOTE — Progress Notes (Signed)
I connected by phone with Renee Figueroa on 12/22/2020 at 11:19 AM to discuss the potential use of a new treatment for mild to moderate COVID-19 viral infection in non-hospitalized patients.  This patient is a 87 y.o. female that meets the FDA criteria for Emergency Use Authorization of COVID monoclonal antibody bebtelovimab.  Has a (+) direct SARS-CoV-2 viral test result  Has mild or moderate COVID-19   Is NOT hospitalized due to COVID-19  Is within 10 days of symptom onset  Has at least one of the high risk factor(s) for progression to severe COVID-19 and/or hospitalization as defined in EUA.  Specific high risk criteria : Older age (>/= 85 yo) and Cardiovascular disease or hypertension   I have spoken and communicated the following to the patient or parent/caregiver regarding COVID monoclonal antibody treatment:  1. FDA has authorized the emergency use for the treatment of mild to moderate COVID-19 in adults and pediatric patients with positive results of direct SARS-CoV-2 viral testing who are 80 years of age and older weighing at least 40 kg, and who are at high risk for progressing to severe COVID-19 and/or hospitalization.  2. The significant known and potential risks and benefits of COVID monoclonal antibody, and the extent to which such potential risks and benefits are unknown.  3. Information on available alternative treatments and the risks and benefits of those alternatives, including clinical trials.  4. Patients treated with COVID monoclonal antibody should continue to self-isolate and use infection control measures (e.g., wear mask, isolate, social distance, avoid sharing personal items, clean and disinfect "high touch" surfaces, and frequent handwashing) according to CDC guidelines.   5. The patient or parent/caregiver has the option to accept or refuse COVID monoclonal antibody treatment.  6. Discussion about the monoclonal antibody infusion does not ensure treatment. The  patient will be placed on a list and scheduled according to risk, symptom onset and availability. A scheduler will reach to the patient to let them know if we can accommodate their infusion or not.  After reviewing this information with the patient, the patient has agreed to receive one of the available covid 19 monoclonal antibodies and will be provided an appropriate fact sheet prior to infusion. Angelena Form, PA-C 12/22/2020 11:19 AM

## 2020-12-23 ENCOUNTER — Ambulatory Visit: Payer: Medicare Other

## 2020-12-29 NOTE — Progress Notes (Addendum)
Patient ID: AWA BACHICHA                 DOB: [birth year]                      MRN: [id number]     HPI: Renee Figueroa is a 87 y.o. female patient of Dr Gardiner Rhyme referred by [name], PA to PharmD clinic for both lipid and HTN management. PMH is significant for HTN, HLD, severe aortic stenosis s/p TAVR 10/20/20, CAD with cardiac cath 08/26/20 showing severe 2 vessel CAD with 90% prox and 80% mid LAD stenosis as well as 80% prox RCA stenosis, arthritis, and osteoporosis. She underwent PCI to RCA on 10/02/20; procedure was complicated by prolonged hypotension following stenting likely related to very severe aortic stenosis. Residual severe prox LAD stenosis required atherectomy, now s/p successful TAVR procedure. Plan to treat LAD medically for now given lack of symptoms and hesitancy to do more invasive cardiac procedures. HTN was elevated at 172/90 at last visit on 4/6 with home readings elevated 160/90 as well. Pt was started on carvedilol 6.25mg  BID. Ezetimibe also added as statin dose limited by arthritis pain that statin was making worse. At first visit with me 2 weeks ago, losartan was changed to higher dose of more effective ARB - valsartan 320mg  daily and amlodipine was restarted at 5mg  daily as BP was severely elevated at 230/90. She was given clonidine in office which did not lower her BP.  Pt presents today in good spirits with her husband. Reports follow up BP later the day after last appt improved to 111/55. Reports tolerating all medications well. She does report white coat HTN for many years. Home readings have improved notable since last visit from 143/63 - 169/81 to 130s/60s after meds (a bit higher before meds - see below for more detail). Usually takes meds at 9am or 9pm, took earlier this AM around 6:30am due to appt today. Unfortunately she had COVID since her last visit 2 weeks ago. Got it from a funeral, initial sx were sore throat and dry cough. Took molnupiravir and is feeling good  now.  Denies LEE, dizziness or headaches. Takes Tylenol for arthritis which works well. Denies NSAID use. HCTZ previously discontinued 06/10/20 visit due to concern that diuretic may be causing dehydration and leading to elevated mid cavitary gradient.   Has not wanted to increase her statin due to her hip pain - unclear if this is due to her statin or arthritis. Understandably, she is afraid of having a fall as she also has osteoporosis. She is tolerating addition of ezetimibe well. Most recent lipid panel reflects her on rosuvastatin 5mg  3x per week rather than the dose increase to 10mg  daily - checking this today.  Home cuff reading today - 173/87, HR 75. Repeat 164/79, 73 Clinic cuff - 168/72, HR 71  Current HTN meds:  carvedilol 6.25mg  BID valsartan 320mg  daily - 9am amlodipine 5mg  daily - 9pm  Previously tried: amlodipine - unclear why this was stopped. Looks like CMA med error on med rec. HCTZ 12.5mg  - stopped 06/10/20 visit due to concern for dehydration  BP goal: <130/39mmHg  Labs: 10/21/20 - Na 132, K 4.1, SCr 0.65  Current lipid meds: rosuvastatin 10mg  daily, ezetimibe 10mg  daily Previously tried: atorvastatin 10mg  daily - hip pain LDL goal: < 70mg /dL Labs: 04/22/20 - TC 266, HDL 79, TG 111, LDL 164 (rosuvastatin 5mg  3 days a week)  Family History: Heart attack (age  of onset: 55) in her father; Hypertension in her mother; Stomach cancer in her mother. There is no history of Colon cancer, Colon polyps, Rectal cancer, Diabetes, or Stroke.  Social History: Denies tobacco, alcohol and illicit drug use.  Diet: Cooks most meals at home. Breakfast - cereal, oatmeal, yogurt parfait, or boiled eggs. Dinner - seafood on Fridays, likes green beans, meatloaf, chicken casserole, tries to limit red meat. Doesn't add salt to food. 1 cup of coffee in the AM, otherwise drinks coffee.  Exercise: Stays active with ADLs, does tire in the afternoon.  BP readings: bicep cuff, measures on left arm,  has had cuff for < 1 year After meds: 133/63, 137/64, 136/68, 133/62 Before meds: 143/69, 148/69, 147/60, 132/61, 134/60, 151/71 HR 62-70  Wt Readings from Last 3 Encounters:  11/25/20 92 lb 6.4 oz (41.9 kg)  10/29/20 100 lb 3.2 oz (45.5 kg)  10/21/20 100 lb 6.4 oz (45.5 kg)   BP Readings from Last 3 Encounters:  12/22/20 (!) 175/83  12/17/20 (!) 230/90  11/25/20 (!) 172/90   Pulse Readings from Last 3 Encounters:  12/22/20 (!) 59  12/17/20 67  11/25/20 86    Renal function: CrCl cannot be calculated (Patient's most recent lab result is older than the maximum 21 days allowed.).  Past Medical History:  Diagnosis Date  . Allergy    seasonal  . Arthritis    mild  . Basal cell carcinoma of anterior chest    skin  . Colon polyps    adenomatous  . Coronary artery disease   . Diverticulosis   . Heart murmur   . Herpes zoster 04/2011   C 6  LUE  . Hyperlipidemia   . Hypertension   . Osteoporosis   . Renal calculi 1962    X 1   . S/P TAVR (transcatheter aortic valve replacement) 10/20/2020   s/p TAVR with a 20 mm Edwards S3U via the TF approach by Drs Burt Knack and Roxy Manns    Current Outpatient Medications on File Prior to Visit  Medication Sig Dispense Refill  . acetaminophen (TYLENOL) 500 MG tablet Take 1,000 mg by mouth every 6 (six) hours as needed for moderate pain.    Marland Kitchen amLODipine (NORVASC) 5 MG tablet TAKE 1 TABLET(5 MG) BY MOUTH DAILY 90 tablet 3  . amoxicillin (AMOXIL) 500 MG tablet Take 4 tablets (2,000 mg) one hour prior to all dental visits. 12 tablet 11  . aspirin EC 81 MG tablet Take 1 tablet (81 mg total) by mouth daily. Swallow whole. 150 tablet 2  . carboxymethylcellulose (REFRESH PLUS) 0.5 % SOLN Place 1 drop into both eyes 3 (three) times daily as needed (dry eyes).    . carvedilol (COREG) 6.25 MG tablet Take 1 tablet (6.25 mg total) by mouth 2 (two) times daily. 180 tablet 3  . clopidogrel (PLAVIX) 75 MG tablet Take 1 tablet (75 mg total) by mouth daily. 90  tablet 3  . ezetimibe (ZETIA) 10 MG tablet Take 1 tablet (10 mg total) by mouth daily. 90 tablet 3  . fluticasone (FLONASE) 50 MCG/ACT nasal spray Place 1 spray into both nostrils daily as needed for allergies or rhinitis.    . Multiple Minerals-Vitamins (CALCIUM & VIT D3 BONE HEALTH PO) Take 2 each by mouth daily. Chewables    . Multiple Vitamins-Minerals (MULTIVITAMIN WITH MINERALS) tablet Take 2 tablets by mouth daily. Chewables    . rosuvastatin (CRESTOR) 10 MG tablet Take 1 tablet (10 mg total) by mouth daily. [place]  tablet 3  . valsartan (DIOVAN) 320 MG tablet Take 1 tablet (320 mg total) by mouth daily. 90 tablet 3   Current Facility-Administered Medications on File Prior to Visit  Medication Dose Route Frequency Provider Last Rate Last Admin  . 0.9 %  sodium chloride infusion   Intravenous PRN Angelena Form R, PA-C        No Known Allergies   Assessment/Plan:  1. Hypertension - Pt with white coat HTN as clinic reading notably higher than home readings and her home cuff is measuring within ~73mmHg of manual clinic reading. Will adjust BP meds based on home readings after she takes her meds, which have been in the mid 130s/60s. Will increase amlodipine from 5mg  to 7.5mg  daily (she's using up 90 day supply, will hold off on sending refill for now) and continue carvedilol 6.25mg  BID. Also called pt's pharmacy as she states valsartan tablets are hard to swallow due to their size. They stated candesartan is smaller - will change valsartan 320mg  to equivalent dose of candesartan 32mg  daily (confirmed $0 copay). Pt will continue to monitor BP at home and I'll call her in 2 weeks to follow up with home readings. Can further increase amlodipine to 10mg  daily if needed (would need new rx at that time).   2. Hyperlipidemia - LDL 164 on most recent check last fall, above goal <70 given recent PCI. Labs reflected pt on rosuvastatin 5mg  3x/week. She is now taking rosuvastatin 10mg  daily as well as  ezetimibe 10mg  daily which was started 3 weeks ago. Checking fasting lipids today. Discussed Repatha at last visit which can be added on if needed.  Nakyah Erdmann E. Andros Channing, PharmD, BCACP, [place] [address], [place], [place] [address] Phone: [phone]; Fax: [phone] 12/29/2020 1:20 PM

## 2020-12-30 ENCOUNTER — Ambulatory Visit (INDEPENDENT_AMBULATORY_CARE_PROVIDER_SITE_OTHER): Payer: Medicare Other | Admitting: Pharmacist

## 2020-12-30 ENCOUNTER — Other Ambulatory Visit: Payer: Medicare Other | Admitting: *Deleted

## 2020-12-30 ENCOUNTER — Other Ambulatory Visit: Payer: Self-pay

## 2020-12-30 VITALS — BP 168/72 | HR 71

## 2020-12-30 DIAGNOSIS — I10 Essential (primary) hypertension: Secondary | ICD-10-CM

## 2020-12-30 DIAGNOSIS — E782 Mixed hyperlipidemia: Secondary | ICD-10-CM | POA: Diagnosis not present

## 2020-12-30 DIAGNOSIS — G720 Drug-induced myopathy: Secondary | ICD-10-CM | POA: Diagnosis not present

## 2020-12-30 DIAGNOSIS — T466X5D Adverse effect of antihyperlipidemic and antiarteriosclerotic drugs, subsequent encounter: Secondary | ICD-10-CM

## 2020-12-30 LAB — COMPREHENSIVE METABOLIC PANEL
ALT: 21 IU/L (ref 0–32)
AST: 26 IU/L (ref 0–40)
Albumin/Globulin Ratio: 1.8 (ref 1.2–2.2)
Albumin: 4.5 g/dL (ref 3.6–4.6)
Alkaline Phosphatase: 76 IU/L (ref 44–121)
BUN/Creatinine Ratio: 19 (ref 12–28)
BUN: 12 mg/dL (ref 8–27)
Bilirubin Total: 0.4 mg/dL (ref 0.0–1.2)
CO2: 26 mmol/L (ref 20–29)
Calcium: 9.8 mg/dL (ref 8.7–10.3)
Chloride: 99 mmol/L (ref 96–106)
Creatinine, Ser: 0.62 mg/dL (ref 0.57–1.00)
Globulin, Total: 2.5 g/dL (ref 1.5–4.5)
Glucose: 106 mg/dL — ABNORMAL HIGH (ref 65–99)
Potassium: 4.3 mmol/L (ref 3.5–5.2)
Sodium: 136 mmol/L (ref 134–144)
Total Protein: 7 g/dL (ref 6.0–8.5)
eGFR: 87 mL/min/{1.73_m2} (ref >59)

## 2020-12-30 LAB — LIPID PANEL
Chol/HDL Ratio: 2.2 ratio (ref 0.0–4.4)
Cholesterol, Total: 142 mg/dL (ref 100–199)
HDL: 65 mg/dL (ref >39)
LDL Chol Calc (NIH): 64 mg/dL (ref 0–99)
Triglycerides: 63 mg/dL (ref 0–149)
VLDL Cholesterol Cal: 13 mg/dL (ref 5–40)

## 2020-12-30 MED ORDER — CANDESARTAN CILEXETIL 32 MG PO TABS: 32.0000 mg | ORAL_TABLET | Freq: Every day | ORAL | 11 refills | Status: DC

## 2020-12-30 NOTE — Patient Instructions (Addendum)
It was nice to see you today!  Your blood pressure goal is < 130/41mmHg -Increase your amlodipine from 5mg  to 7.5mg  once daily - take 1 and 1/2 of your tablets each day -Continue taking your other medications. I'll check with your pharmacy to see if there is a smaller tablet for valsartan -Monitor your blood pressure at home and I'll call you in 2 weeks  Your LDL cholesterol goal is < 70 -Continue on your rosuvastatin 10mg  daily and ezetimibe 10mg  daily -We'll check your cholesterol today and I'll call with results

## 2021-01-07 ENCOUNTER — Other Ambulatory Visit: Payer: Self-pay

## 2021-01-07 DIAGNOSIS — I714 Abdominal aortic aneurysm, without rupture, unspecified: Secondary | ICD-10-CM

## 2021-01-07 DIAGNOSIS — I739 Peripheral vascular disease, unspecified: Secondary | ICD-10-CM

## 2021-01-12 ENCOUNTER — Other Ambulatory Visit: Payer: Self-pay | Admitting: Physician Assistant

## 2021-01-12 MED ORDER — AMLODIPINE BESYLATE 5 MG PO TABS: ORAL_TABLET | ORAL | 3 refills | Status: DC

## 2021-01-13 ENCOUNTER — Telehealth: Payer: Self-pay | Admitting: Pharmacist

## 2021-01-13 NOTE — Telephone Encounter (Signed)
Thank you for all your help. She is sweet. She was a little sad she didn't get to see [facility] again, but I assured her that Dr. Gardiner Rhyme was wonderful :)

## 2021-01-13 NOTE — Telephone Encounter (Signed)
Called pt to follow up with BP readings over the past 2 weeks since increasing amlodipine from 5mg  to 7.5mg  daily and changing valsartan 320mg  to candesartan 32mg  for smaller tablet size per pt request.  Home readings as follows over the past week: 5/17: 132/60, HR 66 5/18: 110/56, HR 68 (after meds) - AM; 118/60, HR 65 - PM 5/19: 149/63, HR 68 (before meds, rushing around); 127/60, HR 61 (after meds) - PM 5/20: 138/61, HR 63 (before meds) 5/23: 136/62, HR 59 (before meds, 1 cup of coffee)  Pt tolerating med changes well; no LE edema on higher dose of amlodipine and reports candesartan tablets are easier for her to swallow. She has white coat HTN so BP medications have been adjusted based on home readings. Her post-med BP readings have been excellent; will continue current therapy.  Pt very appreciative for assistance and wanted to express her thanks to the cardiology team as a whole. She will keep follow up with Dr Gardiner Rhyme in 3 months and was advised to call clinic with any concerns before then.

## 2021-01-14 ENCOUNTER — Telehealth: Payer: Self-pay | Admitting: Internal Medicine

## 2021-01-14 NOTE — Progress Notes (Signed)
  Chronic Care Management   Note  01/14/2021 Name: Renee Figueroa MRN: [id number] DOB: [birth year]  Renee Figueroa is a 87 y.o. year old female who is a primary care patient of Burns, Claudina Lick, MD. I reached out to [name] by phone today in response to a referral sent by [name]'s PCP, Binnie Rail, MD.   [name] was given information about Chronic Care Management services today including:  1. CCM service includes personalized support from designated clinical staff supervised by her physician, including individualized plan of care and coordination with other care providers 2. 24/7 contact phone numbers for assistance for urgent and routine care needs. 3. Service will only be billed when office clinical staff spend 20 minutes or more in a month to coordinate care. 4. Only one practitioner may furnish and bill the service in a calendar month. 5. The patient may stop CCM services at any time (effective at the end of the month) by phone call to the office staff.   Patient wishes to consider information provided and/or speak with a member of the care team before deciding about enrollment in care management services.   Follow up plan:   Lauretta Grill Upstream Scheduler

## 2021-01-25 ENCOUNTER — Telehealth: Payer: Self-pay | Admitting: Internal Medicine

## 2021-01-25 NOTE — Chronic Care Management (AMB) (Signed)
  Chronic Care Management   Note  01/25/2021 Name: Renee Figueroa MRN: [id number] DOB: 1934/01/16  Renee Figueroa is a 87 y.o. year old female who is a primary care patient of Burns, Claudina Lick, MD. I reached out to [name] by phone today in response to a referral sent by Ms. Haywood Filler Dinius's PCP, Binnie Rail, MD.   Ms. Wisz was given information about Chronic Care Management services today including:  1. CCM service includes personalized support from designated clinical staff supervised by her physician, including individualized plan of care and coordination with other care providers 2. 24/7 contact phone numbers for assistance for urgent and routine care needs. 3. Service will only be billed when office clinical staff spend 20 minutes or more in a month to coordinate care. 4. Only one practitioner may furnish and bill the service in a calendar month. 5. The patient may stop CCM services at any time (effective at the end of the month) by phone call to the office staff.   Patient did not agree to enrollment in care management services and does not wish to consider at this time.  Follow up plan:   Lauretta Grill Upstream Scheduler

## 2021-01-28 ENCOUNTER — Telehealth (HOSPITAL_COMMUNITY): Payer: Self-pay

## 2021-01-28 NOTE — Telephone Encounter (Signed)
Called pt to see if she is interested in the cardiac rehab program, pt stated that she has another obligation to see about and that she will call back. Closed referral.

## 2021-04-02 ENCOUNTER — Ambulatory Visit (INDEPENDENT_AMBULATORY_CARE_PROVIDER_SITE_OTHER)
Admission: RE | Admit: 2021-04-02 | Discharge: 2021-04-02 | Disposition: A | Discharge status: Home / Self Care | Payer: Medicare Other | Source: Ambulatory Visit | Attending: Surgery | Admitting: Surgery

## 2021-04-02 ENCOUNTER — Ambulatory Visit: Payer: Medicare Other | Admitting: Vascular Surgery

## 2021-04-02 ENCOUNTER — Other Ambulatory Visit: Payer: Self-pay

## 2021-04-02 ENCOUNTER — Ambulatory Visit (HOSPITAL_COMMUNITY)
Admission: RE | Admit: 2021-04-02 | Discharge: 2021-04-02 | Disposition: A | Discharge status: Home / Self Care | Payer: Medicare Other | Source: Ambulatory Visit | Attending: Surgery | Admitting: Surgery

## 2021-04-02 DIAGNOSIS — I714 Abdominal aortic aneurysm, without rupture, unspecified: Secondary | ICD-10-CM

## 2021-04-13 ENCOUNTER — Other Ambulatory Visit: Payer: Self-pay

## 2021-04-13 ENCOUNTER — Ambulatory Visit (INDEPENDENT_AMBULATORY_CARE_PROVIDER_SITE_OTHER): Payer: Medicare Other | Admitting: Cardiology

## 2021-04-13 VITALS — BP 164/72 | HR 72 | Ht 59.0 in | Wt 100.2 lb

## 2021-04-13 DIAGNOSIS — E785 Hyperlipidemia, unspecified: Secondary | ICD-10-CM | POA: Diagnosis not present

## 2021-04-13 DIAGNOSIS — Z952 Presence of prosthetic heart valve: Secondary | ICD-10-CM | POA: Diagnosis not present

## 2021-04-13 DIAGNOSIS — I10 Essential (primary) hypertension: Secondary | ICD-10-CM

## 2021-04-13 DIAGNOSIS — I2510 Atherosclerotic heart disease of native coronary artery without angina pectoris: Secondary | ICD-10-CM

## 2021-04-13 DIAGNOSIS — I719 Aortic aneurysm of unspecified site, without rupture: Secondary | ICD-10-CM | POA: Diagnosis not present

## 2021-04-13 NOTE — Patient Instructions (Signed)
Medication Instructions:  Your physician recommends that you continue on your current medications as directed. Please refer to the Current Medication list given to you today.  *If you need a refill on your cardiac medications before your next appointment, please call your pharmacy*   Lab Work: BMET today  If you have labs (blood work) drawn today and your tests are completely normal, you will receive your results only by: [place] (if you have MyChart) OR A paper copy in the mail If you have any lab test that is abnormal or we need to change your treatment, we will call you to review the results.  Follow-Up: At [facility], you and your health needs are our priority.  As part of our continuing mission to provide you with exceptional heart care, we have created designated Provider Care Teams.  These Care Teams include your primary Cardiologist (physician) and Advanced Practice Providers (APPs -  Physician Assistants and Nurse Practitioners) who all work together to provide you with the care you need, when you need it.  We recommend signing up for the patient portal called "MyChart".  Sign up information is provided on this After Visit Summary.  MyChart is used to connect with patients for Virtual Visits (Telemedicine).  Patients are able to view lab/test results, encounter notes, upcoming appointments, etc.  Non-urgent messages can be sent to your provider as well.   To learn more about what you can do with MyChart, go to [email or web address].    Your next appointment:   11/29 at 1:40 PM with Dr. Gardiner Rhyme  Other Instructions Please send blood pressure readings via Mychart when you get home

## 2021-04-13 NOTE — Progress Notes (Signed)
Cardiology Office Note:    Date:  04/13/2021   ID:  Renee Figueroa, DOB Dec 24, 1933, MRN [id number]  PCP:  Binnie Rail, MD  Cardiologist:  Donato Heinz, MD  Electrophysiologist:  None   Referring MD: Binnie Rail, MD   Chief Complaint  Patient presents with   Aortic Stenosis     History of Present Illness:    Renee Figueroa is a 87 y.o. female with a hx of hypertension, hyperlipidemia who presents for follow-up.  She was referred by Dr. Quay Burow for evaluation of aortic stenosis, initially seen on 06/10/2020.  She denies any chest pain, shortness of breath, lightheadedness, or syncope.  Denies any palpitations or lower extremity edema.  Reports she does not exercise regularly.  Most exertion she does is walking around her house or walking the dog.  Reports BP is usually under good control at home, typically 120s.  No smoking history.  Father died of MI at age 61.  Echocardiogram on 05/20/2020 showed EF 70 to [id number], grade 1 diastolic dysfunction, severe mid cavitary obstruction (peak gradient 90 mmHg), normal RV function, severe aortic stenosis (mean gradient 40 mmHg).  Alternate imaging recommended to better assess aortic stenosis versus high flow state.  CTA was ordered, with aortic valve calcium score 1647, consistent with severe AS.  Coronary calcium score 476.  Proximal to mid LAD heavily calcified, unable to rule out obstructive CAD.  Cath on 08/26/2020 showed severe two-vessel CAD (80% proximal RCA, 90% proximal LAD, 80% mid LAD, 50% mid LAD) and severe aortic stenosis with mean gradient 53 mmHg.  Underwent PCI to the RCA on 09/1120, with plan to perform PCI on LAD after TAVR.  Underwent TAVR on [id number], no complications.  Echocardiogram on 11/25/2020 showed EF 70 to [id number], grade 1 diastolic dysfunction, normal RV function, 20 mm TAVR valve with mean gradient 18 mmHg.  Decision was made to hold off on PCI LAD unless having symptoms, just manage medically for now.  Since last clinic  visit, she reports that she has been doing well.  Denies any chest pain, dyspnea, lightheadedness, syncope, lower extremity edema, or palpitations.   Reports BP 120s over 60s at home.  She is taking DAPT, denies any bleeding issues.  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 Neg 2013   CORONARY ATHERECTOMY  10/02/2020   CORONARY STENT INTERVENTION N/A 10/02/2020   Procedure: CORONARY STENT INTERVENTION;  Surgeon: Sherren Mocha, MD;  Location: [place] [place] [facility];  Service: Cardiovascular;  Laterality: N/A;   DILATION AND CURETTAGE OF UTERUS     age 48   EYE SURGERY     G 2 P 2  INTRAVASCULAR ULTRASOUND/IVUS N/A 10/02/2020   Procedure: Intravascular Ultrasound/IVUS;  Surgeon: Sherren Mocha, MD;  Location: [place] [facility];  Service: Cardiovascular;  Laterality: N/A;   RIGHT/LEFT HEART CATH AND CORONARY ANGIOGRAPHY N/A 08/26/2020   Procedure: RIGHT/LEFT HEART CATH AND CORONARY ANGIOGRAPHY;  Surgeon: Sherren Mocha, MD;  Location: [place] [facility];  Service: Cardiovascular;  Laterality: N/A;   TEE WITHOUT CARDIOVERSION N/A 10/20/2020   Procedure: TRANSESOPHAGEAL ECHOCARDIOGRAM (TEE);  Surgeon: Sherren Mocha, MD;  Location: [place];  Service: Open Heart Surgery;  Laterality: N/A;   TONSILLECTOMY AND ADENOIDECTOMY     TRANSCATHETER AORTIC VALVE REPLACEMENT, TRANSFEMORAL N/A 10/20/2020    Procedure: TRANSCATHETER AORTIC VALVE REPLACEMENT, TRANSFEMORAL;  Surgeon: Sherren Mocha, MD;  Location: [place];  Service: Open Heart Surgery;  Laterality: N/A;   ULTRASOUND GUIDANCE FOR VASCULAR ACCESS Bilateral 10/20/2020   Procedure: ULTRASOUND GUIDANCE FOR VASCULAR ACCESS;  Surgeon: Sherren Mocha, MD;  Location: [facility];  Service: Open Heart Surgery;  Laterality: Bilateral;    Current Medications: Current Meds  Medication Sig   acetaminophen (TYLENOL) 500 MG tablet Take 1,000 mg by mouth every 6 (six) hours as needed for moderate pain.   amLODipine (NORVASC) 5 MG tablet TAKE 1.5 TABLETS BY MOUTH DAILY   amoxicillin (AMOXIL) 500 MG tablet Take 4 tablets (2,000 mg) one hour prior to all dental visits.   aspirin EC 81 MG tablet Take 1 tablet (81 mg total) by mouth daily. Swallow whole.   candesartan (ATACAND) 32 MG tablet Take 1 tablet (32 mg total) by mouth daily.   carboxymethylcellulose (REFRESH PLUS) 0.5 % SOLN Place 1 drop into both eyes 3 (three) times daily as needed (dry eyes).   carvedilol (COREG) 6.25 MG tablet Take 1 tablet (6.25 mg total) by mouth 2 (two) times daily.   clopidogrel (PLAVIX) 75 MG tablet Take 1 tablet (75 mg total) by mouth daily.   ezetimibe (ZETIA) 10 MG tablet Take 1 tablet (10 mg total) by mouth daily.   fluticasone (FLONASE) 50 MCG/ACT nasal spray Place 1 spray into both nostrils daily as needed for allergies or rhinitis.   Multiple Minerals-Vitamins (CALCIUM & VIT D3 BONE HEALTH PO) Take 2 each by mouth daily. Chewables   Multiple Vitamins-Minerals (MULTIVITAMIN WITH MINERALS) tablet Take 2 tablets by mouth daily. Chewables   rosuvastatin (CRESTOR) 10 MG tablet Take 1 tablet (10 mg total) by mouth daily.   Current Facility-Administered Medications for the 04/13/21 encounter (Office Visit) with Donato Heinz, MD  Medication   0.9 %  sodium chloride infusion     Allergies:   Patient has no known allergies.   Social History   Socioeconomic  History   Marital status: Married    Spouse name: Not on file   Number of children: 2   Years of education: Not on file   Highest education level: Not on file  Occupational History   Not on file  Tobacco Use   Smoking status: Never   Smokeless tobacco: Never  Vaping Use   Vaping Use: Never used  Substance and Sexual Activity   Alcohol use: Yes    Comment: maybe 1 drink a month   Drug use: No   Sexual activity: Not Currently  Other Topics Concern   Not on file  Social History Narrative   Not on file   Social Determinants of Health   Financial Resource Strain: Not on file  Food Insecurity: Not on file  Transportation Needs: Not on file  Physical Activity: Not on file  Stress: Not on file  Social Connections: Not on file     Family History: The patient's family history includes Heart attack (age of onset: 69) in her father; Hypertension in her mother; Stomach cancer in her mother. There is no history of Colon cancer, Colon polyps, Rectal cancer, Diabetes, or Stroke.  ROS:   Please see the history of present illness.     All other systems reviewed and are negative.  EKGs/Labs/Other Studies Reviewed:    The following studies were reviewed today:   EKG:  EKG is ordered today.  The ekg ordered today demonstrates sinus rhythm, rate 72, nonspecific T wave flattening  Recent Labs: 05/04/2020: TSH 2.03 07/22/2020: NT-Pro BNP 1,512 10/21/2020: Hemoglobin 10.7; Magnesium 1.8; Platelets 182 12/30/2020: ALT 21; BUN 12; Creatinine, Ser 0.62; Potassium 4.3; Sodium 136  Recent Lipid Panel    Component Value Date/Time   CHOL 142 12/30/2020 0832   TRIG 63 12/30/2020 0832   HDL 65 12/30/2020 0832   CHOLHDL 2.2 12/30/2020 0832   CHOLHDL 3.4 05/04/2020 1007   VLDL 23.4 05/02/2019 0951   LDLCALC 64 12/30/2020 0832   LDLCALC 164 (H) 05/04/2020 1007   LDLDIRECT 156.8 10/24/2012 1207    Physical Exam:    VS:  BP (!) 164/72   Pulse 72   Ht '4\' 11"'$  (1.499 m)   Wt 100 lb 3.2 oz  (45.5 kg)   SpO2 94%   BMI 20.24 kg/m     Wt Readings from Last 3 Encounters:  04/13/21 100 lb 3.2 oz (45.5 kg)  11/25/20 92 lb 6.4 oz (41.9 kg)  10/29/20 100 lb 3.2 oz (45.5 kg)     GEN:  in no acute distress HEENT: Normal NECK: No JVD CARDIAC: RRR, 2/6 systolic murmur loudest at RUSB RESPIRATORY:  Clear to auscultation without rales, wheezing or rhonchi  ABDOMEN: Soft, non-tender, non-distended MUSCULOSKELETAL:  No edema; No deformity  SKIN: Warm and dry NEUROLOGIC:  Alert and oriented x 3 PSYCHIATRIC:  Normal affect   ASSESSMENT:    1. S/P TAVR (transcatheter aortic valve replacement)   2. Coronary artery disease involving native coronary artery of native heart without angina pectoris   3. Essential hypertension   4. Penetrating ulcer of aorta ([place])   5. Hyperlipidemia, unspecified hyperlipidemia type     PLAN:    Aortic stenosis s/p TAVR 10/20/20: Cath on 08/26/2020 showed severe aortic stenosis with mean gradient 53 mmHg.  Underwent TAVR on [id number], no complications.  Echocardiogram on 11/25/2020 showed EF 70 to [id number], grade 1 diastolic dysfunction, normal RV function, 20 mm sapient 3 valve with mean gradient 18 mmHg.   CAD: Cath on 08/26/2020 showed severe two-vessel CAD (80% proximal RCA, 90% proximal LAD, 80% mid LAD, 50% mid LAD) and severe aortic stenosis with mean gradient 53 mmHg.  Underwent PCI to the RCA on 09/1120, with plan to perform PCI on LAD after TAVR.  Underwent TAVR on [id number], no complications. Plan was medical management consider PCI if sxs   Aortic penetrating ulcer: Noted to have penetrating ulcer measuring 8 mm on pre-TAVR CT scan in the infrarenal abdominal aorta.  Referred to Dr. Donzetta Matters, recommended aortic duplex in 6 months  Hypertension: On candesartan 32 mg daily, amlodipine 7.5 mg daily, Coreg 6.25 mg twice daily.  Check BMET.  BP elevated in clinic today but reports has been well controlled at home, asked to call with home BP log  Hyperlipidemia: On  rosuvastatin 10 mg daily and Zetia 10 mg daily.  LDL 64  on 12/30/2020  RTC in 3 months   Medication Adjustments/Labs and Tests Ordered: Current medicines are reviewed at length with the patient today.  Concerns regarding medicines are outlined above.  Orders Placed This Encounter  Procedures   Basic metabolic panel   EKG [id number]    No orders of the defined types were placed in this encounter.   Patient Instructions  Medication Instructions:  Your physician recommends that you continue on your current medications as directed. Please refer to the Current Medication list given to you today.  *If you need a refill on your cardiac medications before your next appointment, please call your pharmacy*   Lab Work: BMET today  If you have labs (blood work) drawn today and your tests are completely normal, you will receive your results only by: [place] (if you have MyChart) OR A paper copy in the mail If you have any lab test that is abnormal or we need to change your treatment, we will call you to review the results.  Follow-Up: At [facility], you and your health needs are our priority.  As part of our continuing mission to provide you with exceptional heart care, we have created designated Provider Care Teams.  These Care Teams include your primary Cardiologist (physician) and Advanced Practice Providers (APPs -  Physician Assistants and Nurse Practitioners) who all work together to provide you with the care you need, when you need it.  We recommend signing up for the patient portal called "MyChart".  Sign up information is provided on this After Visit Summary.  MyChart is used to connect with patients for Virtual Visits (Telemedicine).  Patients are able to view lab/test results, encounter notes, upcoming appointments, etc.  Non-urgent messages can be sent to your provider as well.   To learn more about what you can do with MyChart, go to [email or web address].    Your next  appointment:   11/29 at 1:40 PM with Dr. Gardiner Rhyme  Other Instructions Please send blood pressure readings via Mychart when you get home   Signed, Donato Heinz, MD  04/13/2021 11:35 PM    [place]

## 2021-04-14 LAB — BASIC METABOLIC PANEL
BUN/Creatinine Ratio: 21 (ref 12–28)
BUN: 15 mg/dL (ref 8–27)
CO2: 22 mmol/L (ref 20–29)
Calcium: 10.4 mg/dL — ABNORMAL HIGH (ref 8.7–10.3)
Chloride: 99 mmol/L (ref 96–106)
Creatinine, Ser: 0.71 mg/dL (ref 0.57–1.00)
Glucose: 86 mg/dL (ref 65–99)
Potassium: 4.2 mmol/L (ref 3.5–5.2)
Sodium: 140 mmol/L (ref 134–144)
eGFR: 83 mL/min/{1.73_m2} (ref >59)

## 2021-04-16 ENCOUNTER — Other Ambulatory Visit: Payer: Self-pay

## 2021-04-16 ENCOUNTER — Ambulatory Visit: Payer: Medicare Other | Admitting: Vascular Surgery

## 2021-04-16 ENCOUNTER — Encounter: Payer: Self-pay | Admitting: Vascular Surgery

## 2021-04-16 VITALS — BP 161/79 | HR 68 | Temp 98.1°F | Resp 14 | Ht 59.0 in | Wt 99.0 lb

## 2021-04-16 DIAGNOSIS — I714 Abdominal aortic aneurysm, without rupture, unspecified: Secondary | ICD-10-CM

## 2021-04-16 NOTE — Progress Notes (Signed)
Patient ID: Renee Figueroa, female   DOB: August 28, 1933, 87 y.o.   MRN: [id number]  Reason for Consult: Follow-up and AAA   Referred by Binnie Rail, MD  Subjective:     HPI:  Renee Figueroa is a 87 y.o. female found to have aortic abnormality on work-up for TAVR.  She has now undergone TAVR and coronary stenting.  She has no new back or abdominal pain.  She did have large popliteal pulses on last evaluation she is here today with popliteal artery duplex as well as aortic duplex.  She has no complaints today.  Past Medical History:  Diagnosis Date   Allergy    seasonal   Arthritis    mild   Basal cell carcinoma of anterior chest    skin   Colon polyps    adenomatous   Coronary artery disease    Diverticulosis    Heart murmur    Herpes zoster 04/2011   C 6  LUE   Hyperlipidemia    Hypertension    Osteoporosis    Renal calculi 1962    X 1    S/P TAVR (transcatheter aortic valve replacement) 10/20/2020   s/p TAVR with a 20 mm Edwards S3U via the TF approach by Drs Burt Knack and Roxy Manns   Family History  Problem Relation Age of Onset   Hypertension Mother    Stomach cancer Mother        died @ 30   Heart attack Father 49   Colon cancer Neg Hx    Colon polyps Neg Hx    Rectal cancer Neg Hx    Diabetes Neg Hx    Stroke Neg Hx    Past Surgical History:  Procedure Laterality Date   CARDIAC CATHETERIZATION     cataract     colonoscopy with polypectomy  2004    adenomatous; Dr Henrene Pastor. Neg 2013   CORONARY ATHERECTOMY  10/02/2020   CORONARY STENT INTERVENTION N/A 10/02/2020   Procedure: CORONARY STENT INTERVENTION;  Surgeon: Sherren Mocha, MD;  Location: [place] [facility];  Service: Cardiovascular;  Laterality: N/A;   DILATION AND CURETTAGE OF UTERUS     age 37   EYE SURGERY     G 2 P 2     INTRAVASCULAR ULTRASOUND/IVUS N/A 10/02/2020   Procedure: Intravascular Ultrasound/IVUS;  Surgeon: Sherren Mocha, MD;  Location: [place] [facility];  Service: Cardiovascular;   Laterality: N/A;   RIGHT/LEFT HEART CATH AND CORONARY ANGIOGRAPHY N/A 08/26/2020   Procedure: RIGHT/LEFT HEART CATH AND CORONARY ANGIOGRAPHY;  Surgeon: Sherren Mocha, MD;  Location: [place] [facility];  Service: Cardiovascular;  Laterality: N/A;   TEE WITHOUT CARDIOVERSION N/A 10/20/2020   Procedure: TRANSESOPHAGEAL ECHOCARDIOGRAM (TEE);  Surgeon: Sherren Mocha, MD;  Location: [place];  Service: Open Heart Surgery;  Laterality: N/A;   TONSILLECTOMY AND ADENOIDECTOMY     TRANSCATHETER AORTIC VALVE REPLACEMENT, TRANSFEMORAL N/A 10/20/2020   Procedure: TRANSCATHETER AORTIC VALVE REPLACEMENT, TRANSFEMORAL;  Surgeon: Sherren Mocha, MD;  Location: [place];  Service: Open Heart Surgery;  Laterality: N/A;   ULTRASOUND GUIDANCE FOR VASCULAR ACCESS Bilateral 10/20/2020   Procedure: ULTRASOUND GUIDANCE FOR VASCULAR ACCESS;  Surgeon: Sherren Mocha, MD;  Location: [place];  Service: Open Heart Surgery;  Laterality: Bilateral;    Short Social History:  Social History   Tobacco Use   Smoking status: Never   Smokeless tobacco: Never  Substance Use Topics   Alcohol use: Yes    Comment: maybe 1 drink a month  No Known Allergies  Current Outpatient Medications  Medication Sig Dispense Refill   acetaminophen (TYLENOL) 500 MG tablet Take 1,000 mg by mouth every 6 (six) hours as needed for moderate pain.     amLODipine (NORVASC) 5 MG tablet TAKE 1.5 TABLETS BY MOUTH DAILY 135 tablet 3   aspirin EC 81 MG tablet Take 1 tablet (81 mg total) by mouth daily. Swallow whole. 150 tablet 2   candesartan (ATACAND) 32 MG tablet Take 1 tablet (32 mg total) by mouth daily. 30 tablet 11   carboxymethylcellulose (REFRESH PLUS) 0.5 % SOLN Place 1 drop into both eyes 3 (three) times daily as needed (dry eyes).     carvedilol (COREG) 6.25 MG tablet Take 1 tablet (6.25 mg total) by mouth 2 (two) times daily. 180 tablet 3   clopidogrel (PLAVIX) 75 MG tablet Take 1 tablet (75 mg total) by mouth daily. 90 tablet 3   ezetimibe  (ZETIA) 10 MG tablet Take 1 tablet (10 mg total) by mouth daily. 90 tablet 3   fluticasone (FLONASE) 50 MCG/ACT nasal spray Place 1 spray into both nostrils daily as needed for allergies or rhinitis.     Multiple Minerals-Vitamins (CALCIUM & VIT D3 BONE HEALTH PO) Take 2 each by mouth daily. Chewables     Multiple Vitamins-Minerals (MULTIVITAMIN WITH MINERALS) tablet Take 2 tablets by mouth daily. Chewables     rosuvastatin (CRESTOR) 10 MG tablet Take 1 tablet (10 mg total) by mouth daily. 90 tablet 3   amoxicillin (AMOXIL) 500 MG tablet Take 4 tablets (2,000 mg) one hour prior to all dental visits. (Patient not taking: Reported on 04/16/2021) 12 tablet 11   Current Facility-Administered Medications  Medication Dose Route Frequency Provider Last Rate Last Admin   0.9 %  sodium chloride infusion   Intravenous PRN Eileen Stanford, PA-C        Review of Systems  Constitutional:  Constitutional negative. HENT: HENT negative.  Eyes: Eyes negative.  Respiratory: Respiratory negative.  Cardiovascular: Cardiovascular negative.  GI: Gastrointestinal negative.  Musculoskeletal: Musculoskeletal negative.  Skin: Skin negative.  Neurological: Neurological negative. Hematologic: Hematologic/lymphatic negative.  Psychiatric: Psychiatric negative.       Objective:  Objective   Vitals:   04/16/21 1411  BP: (!) 161/79  Pulse: 68  Resp: 14  Temp: 98.1 F (36.7 C)  TempSrc: Temporal  SpO2: 98%  Weight: 99 lb (44.9 kg)  Height: '4\' 11"'$  (1.499 m)   Body mass index is 20 kg/m.  Physical Exam HENT:     Head: Normocephalic.     Nose:     Comments: Wearing a mask Eyes:     Pupils: Pupils are equal, round, and reactive to light.  Cardiovascular:     Rate and Rhythm: Normal rate.     Pulses:          Femoral pulses are 2+ on the right side and 2+ on the left side.      Popliteal pulses are 2+ on the right side and 2+ on the left side.  Pulmonary:     Effort: Pulmonary effort is normal.   Abdominal:     General: Abdomen is flat.     Palpations: Abdomen is soft.  Musculoskeletal:        General: Normal range of motion.     Cervical back: Normal range of motion.  Skin:    General: Skin is warm and dry.     Capillary Refill: Capillary refill takes less than 2 seconds.  Neurological:  General: No focal deficit present.     Mental Status: She is alert.  Psychiatric:        Mood and Affect: Mood normal.        Behavior: Behavior normal.        Thought Content: Thought content normal.        Judgment: Judgment normal.    Data: ABI Findings:  +---------+------------------+-----+---------+--------+  Right    Rt Pressure (mmHg)IndexWaveform Comment   +---------+------------------+-----+---------+--------+  Brachial 183                                       +---------+------------------+-----+---------+--------+  PTA      178               0.97 triphasic          +---------+------------------+-----+---------+--------+  DP       179               0.98 triphasic          +---------+------------------+-----+---------+--------+  Great Toe139               0.76                    +---------+------------------+-----+---------+--------+   +---------+------------------+-----+---------+-------+  Left     Lt Pressure (mmHg)IndexWaveform Comment  +---------+------------------+-----+---------+-------+  Brachial 171                                      +---------+------------------+-----+---------+-------+  PTA      196               1.07 triphasic         +---------+------------------+-----+---------+-------+  DP       194               1.06 triphasic         +---------+------------------+-----+---------+-------+  Great Toe140               0.77                   +---------+------------------+-----+---------+-------+   +-------+-----------+-----------+------------+------------+  ABI/TBIToday's ABIToday's  TBIPrevious ABIPrevious TBI  +-------+-----------+-----------+------------+------------+  Right  0.98       0.76                                 +-------+-----------+-----------+------------+------------+  Left   1.07       0.77                                 +-------+-----------+-----------+------------+------------+     +---------------+-------+-----------+--------+--------+-----+--------+  Right PoplitealAP (cm)Transv (cm)WaveformStenosisShapeComments  +---------------+-------+-----------+--------+--------+-----+--------+  Proximal       0.68   0.62       biphasic                       +---------------+-------+-----------+--------+--------+-----+--------+  Mid            0.60   0.70       biphasic                       +---------------+-------+-----------+--------+--------+-----+--------+  Distal  0.58   0.72       biphasic                       +---------------+-------+-----------+--------+--------+-----+--------+   +--------------+-------+-----------+--------+--------+-----+--------+  Left PoplitealAP (cm)Transv (cm)WaveformStenosisShapeComments  +--------------+-------+-----------+--------+--------+-----+--------+  Proximal      0.73   0.86       biphasic                       +--------------+-------+-----------+--------+--------+-----+--------+  Mid           0.64   0.62       biphasic                       +--------------+-------+-----------+--------+--------+-----+--------+  Distal        0.61   0.62       biphasic                       +--------------+-------+-----------+--------+--------+-----+--------+        Summary:  Right: No evidence of popliteal artery aneurysm.   Left: No evidence of popliteal artery aneurysm.   Abdominal Aorta Findings:  +-----------+-------+----------+----------+--------+--------+--------------  ----+  Location   AP (cm)Trans (cm)PSV  (cm/s)WaveformThrombusComments              +-----------+-------+----------+----------+--------+--------+--------------  ----+  Proximal   2.15   1.96      142                                             +-----------+-------+----------+----------+--------+--------+--------------  ----+  Mid        1.61   1.50      125                       Ulcerated  plaque                                                          0.75 x 0.5 cm        +-----------+-------+----------+----------+--------+--------+--------------  ----+  Distal     1.36   1.45      80                                              +-----------+-------+----------+----------+--------+--------+--------------  ----+  RT CIA Prox1.1    1.1       118                                             +-----------+-------+----------+----------+--------+--------+--------------  ----+  LT CIA Prox1.1    1.3       124                                             +-----------+-------+----------+----------+--------+--------+--------------  ----+  Summary:  Abdominal Aorta: No evidence of an abdominal aortic aneurysm was  visualized. Ulcerated plaque seen in the mid aorta.       Assessment/Plan:     87 year old female with really normal-appearing aorta by duplex today.  She has no pain.  We discussed the signs and symptoms of acute aortic syndrome she demonstrates good understanding.  We will see her back in 2 years with repeat aortic duplex.     Waynetta Sandy MD [facility]

## 2021-05-06 ENCOUNTER — Encounter: Payer: Self-pay | Admitting: Internal Medicine

## 2021-05-06 ENCOUNTER — Other Ambulatory Visit: Payer: Self-pay

## 2021-05-06 NOTE — Patient Instructions (Addendum)
Blood work was ordered.     Medications changes include :   none  Your prescription(s) have been submitted to your pharmacy. Please take as directed and contact our office if you believe you are having problem(s) with the medication(s).    Please followup in 1 year    Health Maintenance, Female Adopting a healthy lifestyle and getting preventive care are important in promoting health and wellness. Ask your health care provider about: The right schedule for you to have regular tests and exams. Things you can do on your own to prevent diseases and keep yourself healthy. What should I know about diet, weight, and exercise? Eat a healthy diet  Eat a diet that includes plenty of vegetables, fruits, low-fat dairy products, and lean protein. Do not eat a lot of foods that are high in solid fats, added sugars, or sodium. Maintain a healthy weight Body mass index (BMI) is used to identify weight problems. It estimates body fat based on height and weight. Your health care provider can help determine your BMI and help you achieve or maintain a healthy weight. Get regular exercise Get regular exercise. This is one of the most important things you can do for your health. Most adults should: Exercise for at least 150 minutes each week. The exercise should increase your heart rate and make you sweat (moderate-intensity exercise). Do strengthening exercises at least twice a week. This is in addition to the moderate-intensity exercise. Spend less time sitting. Even light physical activity can be beneficial. Watch cholesterol and blood lipids Have your blood tested for lipids and cholesterol at 85 years of age, then have this test every 5 years. Have your cholesterol levels checked more often if: Your lipid or cholesterol levels are high. You are older than 85 years of age. You are at high risk for heart disease. What should I know about cancer screening? Depending on your health history and  family history, you may need to have cancer screening at various ages. This may include screening for: Breast cancer. Cervical cancer. Colorectal cancer. Skin cancer. Lung cancer. What should I know about heart disease, diabetes, and high blood pressure? Blood pressure and heart disease High blood pressure causes heart disease and increases the risk of stroke. This is more likely to develop in people who have high blood pressure readings, are of African descent, or are overweight. Have your blood pressure checked: Every 3-5 years if you are 19-79 years of age. Every year if you are 30 years old or older. Diabetes Have regular diabetes screenings. This checks your fasting blood sugar level. Have the screening done: Once every three years after age 108 if you are at a normal weight and have a low risk for diabetes. More often and at a younger age if you are overweight or have a high risk for diabetes. What should I know about preventing infection? Hepatitis B If you have a higher risk for hepatitis B, you should be screened for this virus. Talk with your health care provider to find out if you are at risk for hepatitis B infection. Hepatitis C Testing is recommended for: Everyone born from [birth year] through 1963. Anyone with known risk factors for hepatitis C. Sexually transmitted infections (STIs) Get screened for STIs, including gonorrhea and chlamydia, if: You are sexually active and are younger than 85 years of age. You are older than 85 years of age and your health care provider tells you that you are at risk for this type of infection.  Your sexual activity has changed since you were last screened, and you are at increased risk for chlamydia or gonorrhea. Ask your health care provider if you are at risk. Ask your health care provider about whether you are at high risk for HIV. Your health care provider may recommend a prescription medicine to help prevent HIV infection. If you choose to take  medicine to prevent HIV, you should first get tested for HIV. You should then be tested every 3 months for as long as you are taking the medicine. Pregnancy If you are about to stop having your period (premenopausal) and you may become pregnant, seek counseling before you get pregnant. Take 400 to 800 micrograms (mcg) of folic acid every day if you become pregnant. Ask for birth control (contraception) if you want to prevent pregnancy. Osteoporosis and menopause Osteoporosis is a disease in which the bones lose minerals and strength with aging. This can result in bone fractures. If you are 34 years old or older, or if you are at risk for osteoporosis and fractures, ask your health care provider if you should: Be screened for bone loss. Take a calcium or vitamin D supplement to lower your risk of fractures. Be given hormone replacement therapy (HRT) to treat symptoms of menopause. Follow these instructions at home: Lifestyle Do not use any products that contain nicotine or tobacco, such as cigarettes, e-cigarettes, and chewing tobacco. If you need help quitting, ask your health care provider. Do not use street drugs. Do not share needles. Ask your health care provider for help if you need support or information about quitting drugs. Alcohol use Do not drink alcohol if: Your health care provider tells you not to drink. You are pregnant, may be pregnant, or are planning to become pregnant. If you drink alcohol: Limit how much you use to 0-1 drink a day. Limit intake if you are breastfeeding. Be aware of how much alcohol is in your drink. In the [place] one drink equals one 12 oz bottle of beer (355 mL), one 5 oz glass of wine (148 mL), or one 1 oz glass of hard liquor (44 mL). General instructions Schedule regular health, dental, and eye exams. Stay current with your vaccines. Tell your health care provider if: You often feel depressed. You have ever been abused or do not feel safe at  home. Summary Adopting a healthy lifestyle and getting preventive care are important in promoting health and wellness. Follow your health care provider's instructions about healthy diet, exercising, and getting tested or screened for diseases. Follow your health care provider's instructions on monitoring your cholesterol and blood pressure. This information is not intended to replace advice given to you by your health care provider. Make sure you discuss any questions you have with your health care provider. Document Revised: 10/16/2020 Document Reviewed: 08/01/2018 Elsevier Patient Education  2022 [employer].

## 2021-05-06 NOTE — Progress Notes (Signed)
Subjective:    Patient ID: Renee Figueroa, female    DOB: 08-08-1934, 87 y.o.   MRN: [id number]   This visit occurred during the SARS-CoV-2 public health emergency.  Safety protocols were in place, including screening questions prior to the visit, additional usage of staff PPE, and extensive cleaning of exam room while observing appropriate contact time as indicated for disinfecting solutions.    HPI She is here for a physical exam.   She has no concerns.    Medications and allergies reviewed with patient and updated if appropriate.  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     amLODipine (NORVASC) 5 MG tablet TAKE 1.5 TABLETS BY MOUTH DAILY 135 tablet 3   aspirin EC 81 MG tablet Take 1 tablet (81 mg total) by mouth daily. Swallow whole. 150 tablet 2   candesartan (ATACAND) 32 MG tablet Take 1 tablet (32 mg total) by mouth daily. 30 tablet 11   carboxymethylcellulose (REFRESH PLUS) 0.5 % SOLN Place 1 drop into both eyes 3 (three) times daily as needed (dry eyes).     carvedilol (COREG) 6.25 MG tablet Take 1 tablet (6.25 mg total) by mouth 2 (two) times daily. 180 tablet 3   clopidogrel (PLAVIX) 75 MG tablet Take 1 tablet (75 mg total) by mouth daily. 90 tablet 3   ezetimibe (ZETIA) 10 MG tablet Take 1 tablet (10 mg total) by mouth daily. 90 tablet 3   fluticasone  (FLONASE) 50 MCG/ACT nasal spray Place 1 spray into both nostrils daily as needed for allergies or rhinitis.     Multiple Minerals-Vitamins (CALCIUM & VIT D3 BONE HEALTH PO) Take 2 each by mouth daily. Chewables     Multiple Vitamins-Minerals (MULTIVITAMIN WITH MINERALS) tablet Take 2 tablets by mouth daily. Chewables     rosuvastatin (CRESTOR) 10 MG tablet Take 1 tablet (10 mg total) by mouth daily. 90 tablet 3   No current facility-administered medications on file prior to visit.    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 Neg 2013  CORONARY ATHERECTOMY  10/02/2020   CORONARY STENT INTERVENTION N/A 10/02/2020   Procedure: CORONARY STENT INTERVENTION;  Surgeon: Sherren Mocha, MD;  Location: [facility];  Service: Cardiovascular;  Laterality: N/A;   DILATION AND CURETTAGE OF UTERUS     age 24   EYE SURGERY     G 2 P 2     INTRAVASCULAR ULTRASOUND/IVUS N/A 10/02/2020   Procedure: Intravascular Ultrasound/IVUS;  Surgeon: Sherren Mocha, MD;  Location: [facility];  Service: Cardiovascular;  Laterality: N/A;   RIGHT/LEFT HEART CATH AND CORONARY ANGIOGRAPHY N/A 08/26/2020   Procedure: RIGHT/LEFT HEART CATH AND CORONARY ANGIOGRAPHY;  Surgeon: Sherren Mocha, MD;  Location: [facility];  Service: Cardiovascular;  Laterality: N/A;   TEE WITHOUT CARDIOVERSION N/A 10/20/2020    Procedure: TRANSESOPHAGEAL ECHOCARDIOGRAM (TEE);  Surgeon: Sherren Mocha, MD;  Location: [facility];  Service: Open Heart Surgery;  Laterality: N/A;   TONSILLECTOMY AND ADENOIDECTOMY     TRANSCATHETER AORTIC VALVE REPLACEMENT, TRANSFEMORAL N/A 10/20/2020   Procedure: TRANSCATHETER AORTIC VALVE REPLACEMENT, TRANSFEMORAL;  Surgeon: Sherren Mocha, MD;  Location: [facility];  Service: Open Heart Surgery;  Laterality: N/A;   ULTRASOUND GUIDANCE FOR VASCULAR ACCESS Bilateral 10/20/2020   Procedure: ULTRASOUND GUIDANCE FOR VASCULAR ACCESS;  Surgeon: Sherren Mocha, MD;  Location: [place];  Service: Open Heart Surgery;  Laterality: Bilateral;    Social History   Socioeconomic History   Marital status: Married    Spouse name: Not on file   Number of children: 2   Years of education: Not on file   Highest education level: Not on file  Occupational History   Not on file  Tobacco Use   Smoking status: Never   Smokeless tobacco: Never  Vaping Use   Vaping Use: Never used  Substance and Sexual Activity   Alcohol use: Yes    Comment: maybe 1 drink a month   Drug use: No   Sexual activity: Not Currently  Other Topics Concern   Not on file  Social History Narrative   Not on file   Social Determinants of Health   Financial Resource Strain: Not on file  Food Insecurity: Not on file  Transportation Needs: Not on file  Physical Activity: Not on file  Stress: Not on file  Social Connections: Not on file    Family History  Problem Relation Age of Onset   Hypertension Mother    Stomach cancer Mother        died @ 29   Heart attack Father 65   Colon cancer Neg Hx    Colon polyps Neg Hx    Rectal cancer Neg Hx    Diabetes Neg Hx    Stroke Neg Hx     Review of Systems  Constitutional:  Negative for chills and fever.  Eyes:  Negative for visual disturbance.  Respiratory:  Negative for cough, shortness of breath and wheezing.   Cardiovascular:  Negative for chest pain, palpitations and leg  swelling.  Gastrointestinal:  Negative for abdominal pain, blood in stool, constipation, diarrhea and nausea.       No gerd  Genitourinary:  Negative for dysuria.  Musculoskeletal:  Positive for arthralgias (knee pain). Negative for back pain.  Skin:  Negative for color change and rash.  Neurological:  Negative for dizziness, light-headedness and headaches.  Psychiatric/Behavioral:  Negative for dysphoric mood. The patient is not nervous/anxious.       Objective:   Vitals:   05/07/21 1306  BP: 128/70  Pulse:  64  Temp: 98.1 F (36.7 C)  SpO2: 94%   Filed Weights   05/07/21 1306  Weight: 100 lb 6.4 oz (45.5 kg)   Body mass index is 20.28 kg/m.  BP Readings from Last 3 Encounters:  05/07/21 128/70  04/16/21 (!) 161/79  04/13/21 (!) 164/72    Wt Readings from Last 3 Encounters:  05/07/21 100 lb 6.4 oz (45.5 kg)  04/16/21 99 lb (44.9 kg)  04/13/21 100 lb 3.2 oz (45.5 kg)     Physical Exam Constitutional: She appears well-developed and well-nourished. No distress.  HENT:  Head: Normocephalic and atraumatic.  Right Ear: External ear normal. Normal ear canal and TM Left Ear: External ear normal.  Normal ear canal and TM Mouth/Throat: Oropharynx is clear and moist.  Eyes: Conjunctivae and EOM are normal.  Neck: Neck supple. No tracheal deviation present. No thyromegaly present.  No carotid bruit  Cardiovascular: Normal rate, regular rhythm and normal heart sounds.   2/6 sys murmur heard.  No edema. Pulmonary/Chest: Effort normal and breath sounds normal. No respiratory distress. She has no wheezes. She has no rales.  Breast: deferred   Abdominal: Soft. She exhibits no distension. There is no tenderness.  Lymphadenopathy: She has no cervical adenopathy.  Skin: Skin is warm and dry. She is not diaphoretic.  Psychiatric: She has a normal mood and affect. Her behavior is normal.     Lab Results  Component Value Date   WBC 10.8 (H) 10/21/2020   HGB 10.7 (L) 10/21/2020    HCT 31.0 (L) 10/21/2020   PLT 182 10/21/2020   GLUCOSE 86 04/13/2021   CHOL 142 12/30/2020   TRIG 63 12/30/2020   HDL 65 12/30/2020   LDLDIRECT 156.8 10/24/2012   LDLCALC 64 12/30/2020   ALT 21 12/30/2020   AST 26 12/30/2020   NA 140 04/13/2021   K 4.2 04/13/2021   CL 99 04/13/2021   CREATININE 0.71 04/13/2021   BUN 15 04/13/2021   CO2 22 04/13/2021   TSH 2.03 05/04/2020   INR 1.0 10/16/2020   HGBA1C 5.5 05/04/2020         Assessment & Plan:   Physical exam: Screening blood work  ordered Exercise  active Weight  - on low side, but stable Substance abuse  none   Reviewed recommended immunizations.   Health Maintenance  Topic Date Due   DEXA SCAN  02/29/2020   Zoster Vaccines- Shingrix (1 of 2) 08/06/2021 (Originally 11/21/1985)   TETANUS/TDAP  10/25/2022   INFLUENZA VACCINE  Completed   COVID-19 Vaccine  Completed   HPV VACCINES  Aged Out          See Problem List for Assessment and Plan of chronic medical problems.

## 2021-05-07 ENCOUNTER — Ambulatory Visit (INDEPENDENT_AMBULATORY_CARE_PROVIDER_SITE_OTHER): Payer: Medicare Other | Admitting: Internal Medicine

## 2021-05-07 ENCOUNTER — Encounter: Payer: Self-pay | Admitting: Internal Medicine

## 2021-05-07 VITALS — BP 128/70 | HR 64 | Temp 98.1°F | Ht 59.0 in | Wt 100.4 lb

## 2021-05-07 DIAGNOSIS — I10 Essential (primary) hypertension: Secondary | ICD-10-CM

## 2021-05-07 DIAGNOSIS — E782 Mixed hyperlipidemia: Secondary | ICD-10-CM

## 2021-05-07 DIAGNOSIS — M810 Age-related osteoporosis without current pathological fracture: Secondary | ICD-10-CM | POA: Diagnosis not present

## 2021-05-07 DIAGNOSIS — Z23 Encounter for immunization: Secondary | ICD-10-CM | POA: Diagnosis not present

## 2021-05-07 DIAGNOSIS — Z0000 Encounter for general adult medical examination without abnormal findings: Secondary | ICD-10-CM | POA: Diagnosis not present

## 2021-05-07 LAB — COMPREHENSIVE METABOLIC PANEL
ALT: 21 U/L (ref 0–35)
AST: 25 U/L (ref 0–37)
Albumin: 4.5 g/dL (ref 3.5–5.2)
Alkaline Phosphatase: 68 U/L (ref 39–117)
BUN: 18 mg/dL (ref 6–23)
CO2: 28 mEq/L (ref 19–32)
Calcium: 10.1 mg/dL (ref 8.4–10.5)
Chloride: 100 mEq/L (ref 96–112)
Creatinine, Ser: 0.65 mg/dL (ref 0.40–1.20)
GFR: 80.26 mL/min (ref >60.00)
Glucose, Bld: 88 mg/dL (ref 70–99)
Potassium: 4.3 mEq/L (ref 3.5–5.1)
Sodium: 136 mEq/L (ref 135–145)
Total Bilirubin: 0.5 mg/dL (ref 0.2–1.2)
Total Protein: 7.5 g/dL (ref 6.0–8.3)

## 2021-05-07 LAB — CBC WITH DIFFERENTIAL/PLATELET
Basophils Absolute: 0 10*3/uL (ref 0.0–0.1)
Basophils Relative: 0.5 % (ref 0.0–3.0)
Eosinophils Absolute: 0.2 10*3/uL (ref 0.0–0.7)
Eosinophils Relative: 2.6 % (ref 0.0–5.0)
HCT: 40.4 % (ref 36.0–46.0)
Hemoglobin: 13.5 g/dL (ref 12.0–15.0)
Lymphocytes Relative: 37.6 % (ref 12.0–46.0)
Lymphs Abs: 3.4 10*3/uL (ref 0.7–4.0)
MCHC: 33.4 g/dL (ref 30.0–36.0)
MCV: 92.4 fl (ref 78.0–100.0)
Monocytes Absolute: 0.6 10*3/uL (ref 0.1–1.0)
Monocytes Relative: 6.4 % (ref 3.0–12.0)
Neutro Abs: 4.7 10*3/uL (ref 1.4–7.7)
Neutrophils Relative %: 52.9 % (ref 43.0–77.0)
Platelets: 244 10*3/uL (ref 150.0–400.0)
RBC: 4.37 Mil/uL (ref 3.87–5.11)
RDW: 12.9 % (ref 11.5–15.5)
WBC: 9 10*3/uL (ref 4.0–10.5)

## 2021-05-07 LAB — TSH: TSH: 1.53 u[IU]/mL (ref 0.35–5.50)

## 2021-05-07 LAB — LIPID PANEL
Cholesterol: 143 mg/dL (ref 0–200)
HDL: 69.5 mg/dL (ref >39.00)
LDL Cholesterol: 55 mg/dL (ref 0–99)
NonHDL: 73.95
Total CHOL/HDL Ratio: 2
Triglycerides: 94 mg/dL (ref 0.0–149.0)
VLDL: 18.8 mg/dL (ref 0.0–40.0)

## 2021-05-07 LAB — VITAMIN D 25 HYDROXY (VIT D DEFICIENCY, FRACTURES): VITD: 54.04 ng/mL (ref 30.00–100.00)

## 2021-05-07 NOTE — Addendum Note (Signed)
Addended by: Boris Lown B on: 05/07/2021 01:41 PM   Modules accepted: Orders

## 2021-05-07 NOTE — Assessment & Plan Note (Addendum)
Chronic Check lipid panel  Continue crestor 10 mg qd, zetia 10 mg qd Regular exercise and healthy diet encouraged

## 2021-05-07 NOTE — Assessment & Plan Note (Signed)
Chronic dexa due - ordered Check vitamin d level Would be ok with prolia - could consider evenity as well

## 2021-05-07 NOTE — Assessment & Plan Note (Addendum)
Chronic BP well controlled Continue norvasc 5 mg bid, coreg 6.25 mg bid, atacand 32 mg qd  cmp

## 2021-05-17 ENCOUNTER — Other Ambulatory Visit: Payer: Self-pay | Admitting: Cardiology

## 2021-05-22 ENCOUNTER — Telehealth: Payer: Self-pay

## 2021-05-22 NOTE — Telephone Encounter (Signed)
Prolia VOB initiated via [email or web address]  Last OV:  Next OV:  Last Prolia inj: NEW START Next Prolia inj DUE:   

## 2021-05-24 NOTE — Telephone Encounter (Signed)
Prior auth required for [employer]

## 2021-06-07 NOTE — Telephone Encounter (Signed)
Pt ready for scheduling on or after 06/07/21  Out-of-pocket cost due at time of visit: $30  Primary: UHC Medicare Prolia co-insurance: 0% Admin fee co-insurance: $30   Secondary: n/a Prolia co-insurance:  Admin fee co-insurance:   Deductible: does not apply  Prior Auth: APPROVED PA# [id number] Valid: 06/07/21-06/07/22    ** This summary of benefits is an estimation of the patient's out-of-pocket cost. Exact cost may very based on individual plan coverage.

## 2021-06-16 IMAGING — CT CT HEART MORP W/ CTA COR W/ SCORE W/ CA W/CM &/OR W/O CM
1 series · 12 of 14 positions shown, 15 images · non-contrast
Comparison: Chest CT 01/13/2004.
COMPARISON: Chest CT 01/13/2004.

Addendum:
EXAM:
OVER-READ INTERPRETATION  CT CHEST

The following report is an over-read performed by radiologist Dr.
Mika Ml [REDACTED] on 06/30/2020. This
over-read does not include interpretation of cardiac or coronary
anatomy or pathology. The coronary calcium score/coronary CTA
interpretation by the cardiologist is attached.
CLINICAL DATA: 86 -year-old female with severe aortic stenosis
being evaluated for a TAVR procedure.
Cardiac TAVR CT
TECHNIQUE: The patient was scanned on a Phillips Force scanner. A 120 kV
retrospective scan was triggered in the descending thoracic aorta at
111 HU's. Gantry rotation speed was 250 msecs and collimation was .6
mm. No beta blockade or nitro were given. The 3D data set was
reconstructed in 5% intervals of the R-R cycle. Systolic and
diastolic phases were analyzed on a dedicated work station using
MPR, MIP and VRT modes. The patient received 80 cc of contrast.

[Series 1181: tavr · 0.13mm/px · 12 of 14 slices shown, 15 images]
[im 2/14  vessel]
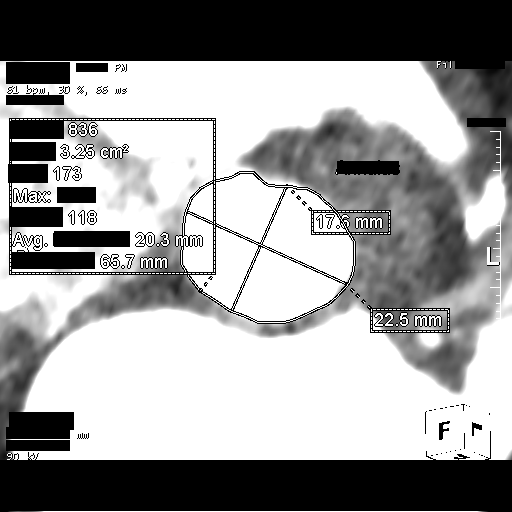
[im 2/14  lung]
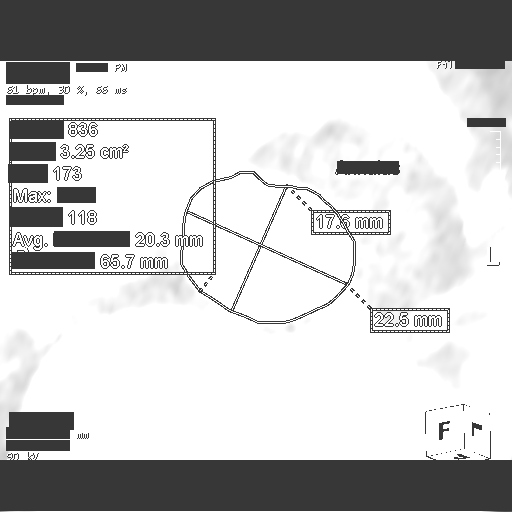
[im 3/14  vessel]
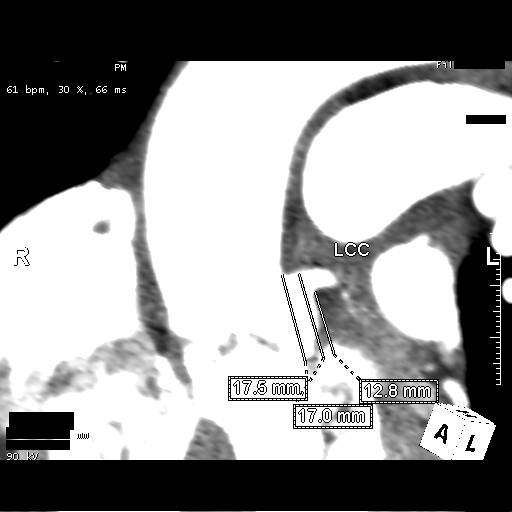
[im 4/14  vessel]
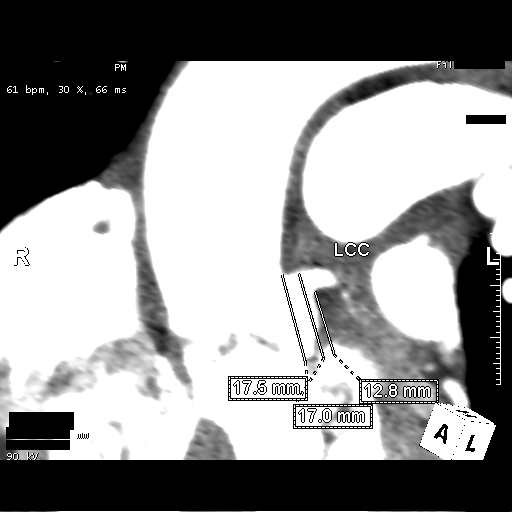
[im 5/14  vessel]
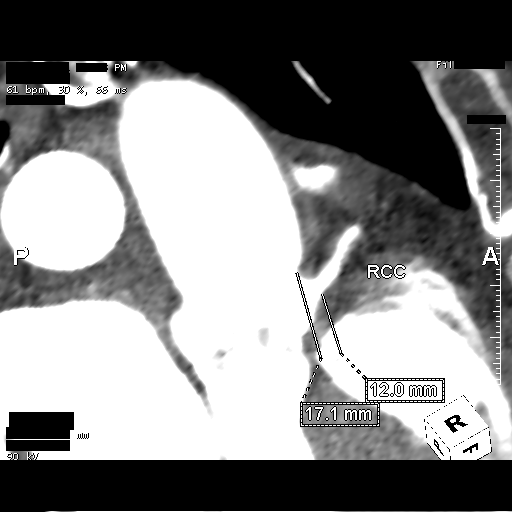
[im 6/14  vessel]
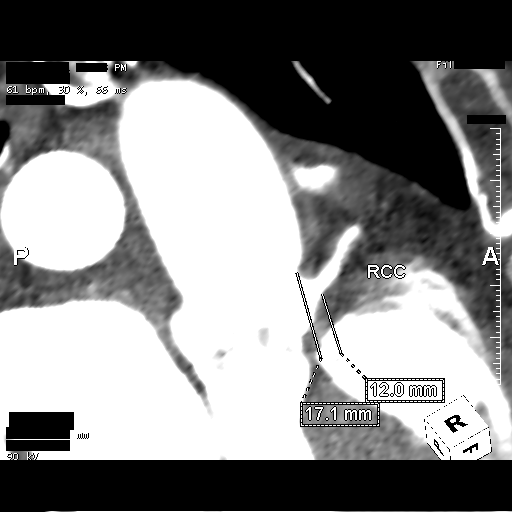
[im 6/14  lung]
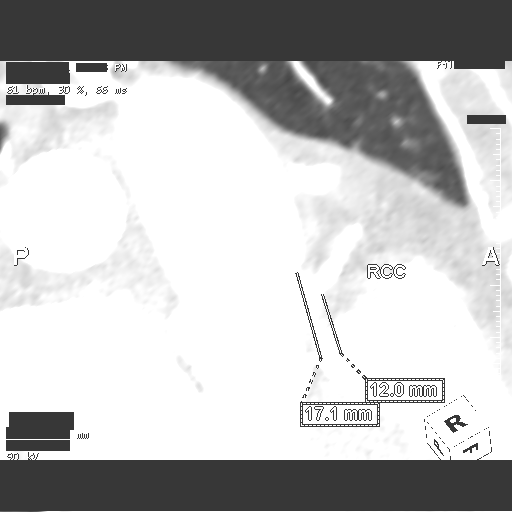
[im 7/14  vessel]
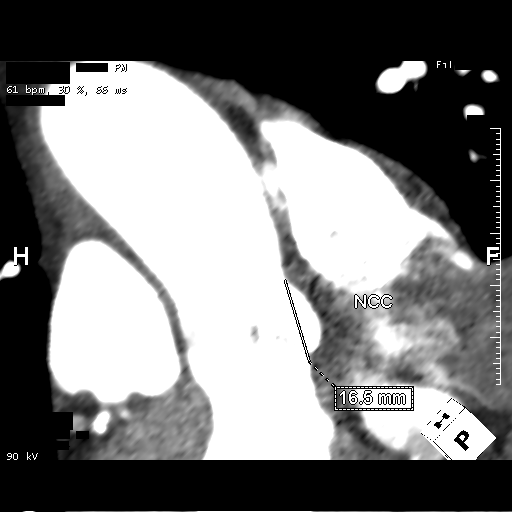
[im 8/14  vessel]
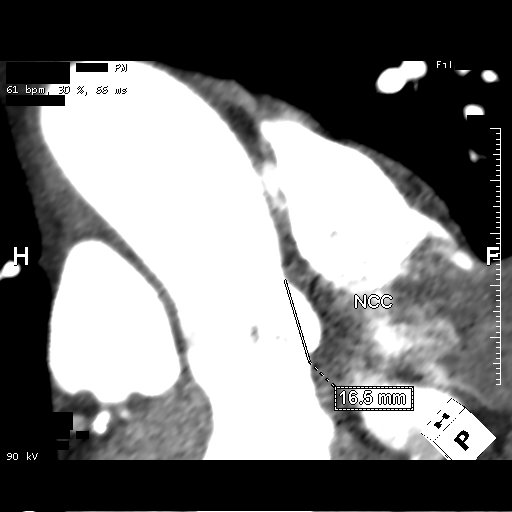
[im 9/14  vessel]
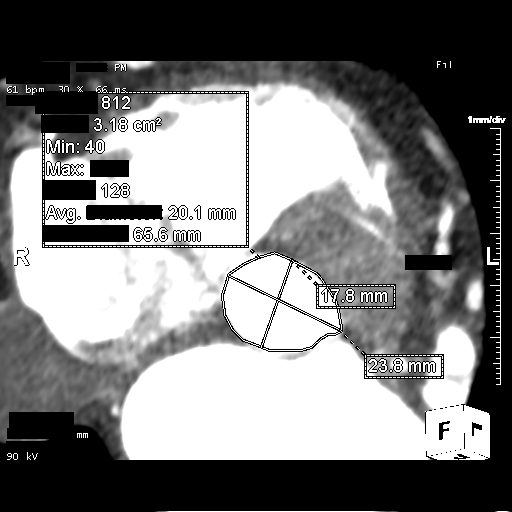
[im 10/14  vessel]
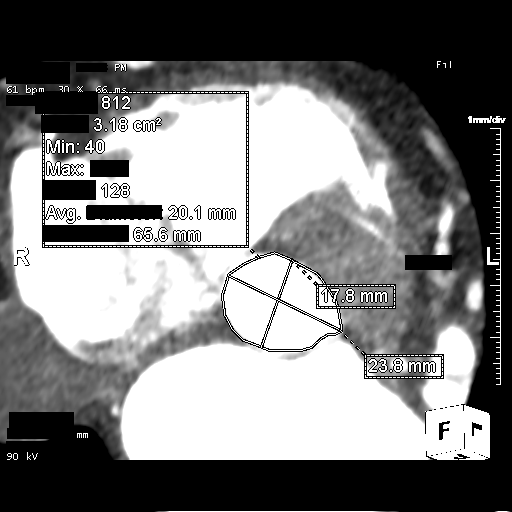
[im 10/14  lung]
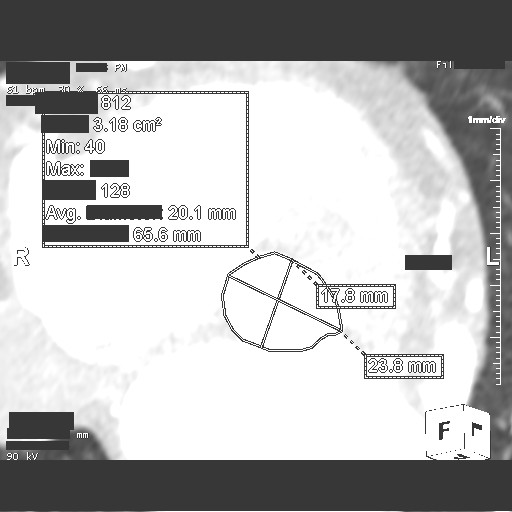
[im 11/14  vessel]
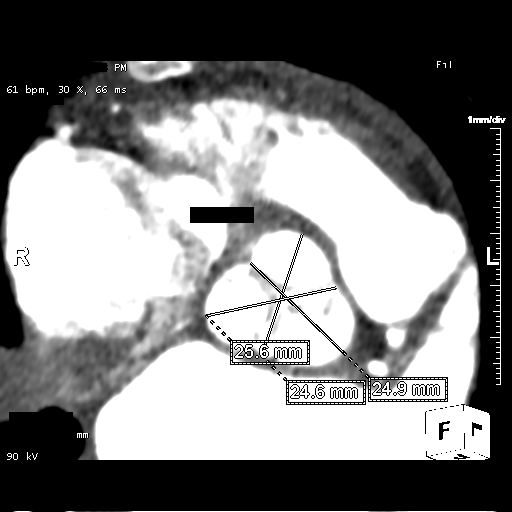
[im 12/14  vessel]
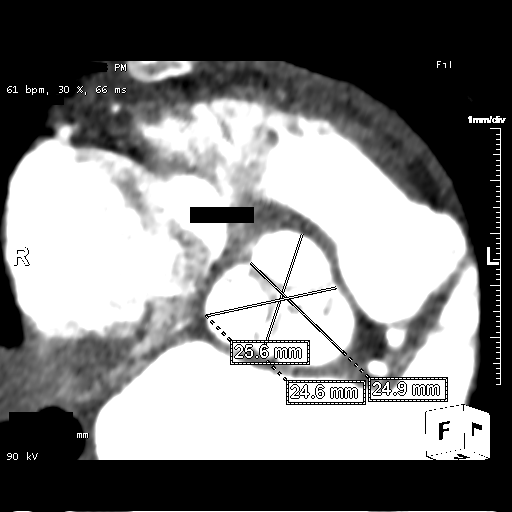
[im 13/14  vessel]
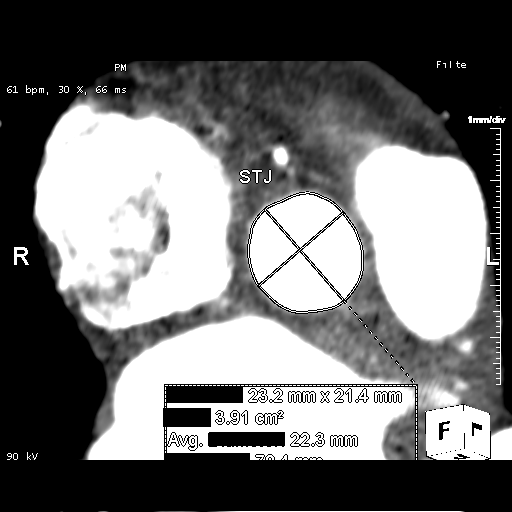

[12 of 14 positions shown; findings below may reference images not displayed]

FINDINGS: Aortic atherosclerosis. Within the visualized portions of the thorax
there are no suspicious appearing pulmonary nodules or masses, there
is no acute consolidative airspace disease, no pleural effusions, no
pneumothorax and no lymphadenopathy. Visualized portions of the
upper abdomen are unremarkable. There are no aggressive appearing
lytic or blastic lesions noted in the visualized portions of the
skeleton.
IMPRESSION: 1.  Aortic Atherosclerosis (0ZFVB-WD3.3).
FINDINGS: Aortic Root:

Aortic valve:trileaflet

Aortic valve calcium score: 8421

Aortic annulus:

Diameter: 23mm x 18mm

Perimeter: 66mm

Area: 325 cm^2

Calcifications: Mild calcification adjacent to left coronary cusp

Coronary height: Min Left - 13mm, Max Left - 17mm; Min Right - 12mm

Sinotubular height: Left cusp - 18mm; Right cusp - 17mm; Noncoronary
cusp - 17mm

LVOT (as measured 3 mm below the annulus):

Diameter: 24mm x 18mm

Area: 318 mm^2

Calcifications: No calcifications

Aortic sinus width: Left cusp - 25mm; Right cusp - 25mm; Noncoronary
cusp - 26mm

Sinotubular junction width: 23mm x 21mm

Optimum Fluoroscopic Angle for Delivery: LAO 27 ROLENCHA 3

Cardiac:

Right atrium: Normal size

Right ventricle: Normal size

Pulmonary arteries: Normal size

Pulmonary veins: Normal configuration

Left atrium: Mild enlargement

Left ventricle: Normal size

Pericardium: Normal thickness

Coronary arteries: Calcium score 476. Proximal to mid LAD heavily
calcified, unable to rule out obstructive CAD
IMPRESSION: 1. Trileaflet aortic valve with severe calcifications (calcium score
8421)

2. Aortic annulus measures 23mm x 18mm with area 325 mm^2 and
perimeter 66mm. Mild annular calcification adjacent to left coronary
cusp. Annular measurements suitable for delivery or 26mm Evolut
valve.

3. Sufficient coronary to annulus distance, measuring 13mm to left
main and 12mm to RCA

4. Optimum Fluoroscopic Angle for Delivery:  LAO 27 ROLENCHA 3

5. Coronary calcium score 476 (74th percentile). Proximal to mid LAD
heavily calcified, unable to rule out obstructive CAD

*** End of Addendum ***
EXAM:
OVER-READ INTERPRETATION  CT CHEST

The following report is an over-read performed by radiologist Dr.
Mika Ml [REDACTED] on 06/30/2020. This
over-read does not include interpretation of cardiac or coronary
anatomy or pathology. The coronary calcium score/coronary CTA
interpretation by the cardiologist is attached.
FINDINGS: Aortic atherosclerosis. Within the visualized portions of the thorax
there are no suspicious appearing pulmonary nodules or masses, there
is no acute consolidative airspace disease, no pleural effusions, no
pneumothorax and no lymphadenopathy. Visualized portions of the
upper abdomen are unremarkable. There are no aggressive appearing
lytic or blastic lesions noted in the visualized portions of the
skeleton.
IMPRESSION: 1.  Aortic Atherosclerosis (0ZFVB-WD3.3).

## 2021-07-19 NOTE — Progress Notes (Signed)
Cardiology Office Note:    Date:  07/20/2021   ID:  Renee Figueroa, DOB May 20, 1934, MRN [id number]  PCP:  Binnie Rail, MD  Cardiologist:  Donato Heinz, MD  Electrophysiologist:  None   Referring MD: Binnie Rail, MD   Chief Complaint  Patient presents with   Cardiac Valve Problem      History of Present Illness:    Renee Figueroa is a 87 y.o. female with a hx of hypertension, hyperlipidemia who presents for follow-up.  She was referred by Dr. Quay Burow for evaluation of aortic stenosis, initially seen on 06/10/2020.  She denies any chest pain, shortness of breath, lightheadedness, or syncope.  Denies any palpitations or lower extremity edema.  Reports she does not exercise regularly.  Most exertion she does is walking around her house or walking the dog.  Reports BP is usually under good control at home, typically 120s.  No smoking history.  Father died of MI at age 77.  Echocardiogram on 05/20/2020 showed EF 70 to 54%, grade 1 diastolic dysfunction, severe mid cavitary obstruction (peak gradient 90 mmHg), normal RV function, severe aortic stenosis (mean gradient 40 mmHg).  Alternate imaging recommended to better assess aortic stenosis versus high flow state.  CTA was ordered, with aortic valve calcium score 1647, consistent with severe AS.  Coronary calcium score 476.  Proximal to mid LAD heavily calcified, unable to rule out obstructive CAD.  Cath on 08/26/2020 showed severe two-vessel CAD (80% proximal RCA, 90% proximal LAD, 80% mid LAD, 50% mid LAD) and severe aortic stenosis with mean gradient 53 mmHg.  Underwent PCI to the RCA on 09/1120, with plan to perform PCI on LAD after TAVR.  Underwent TAVR on 09/28/621, no complications.  Echocardiogram on 11/25/2020 showed EF 70 to 76%, grade 1 diastolic dysfunction, normal RV function, 20 mm TAVR valve with mean gradient 18 mmHg.  Decision was made to hold off on PCI LAD unless having symptoms, just manage medically for now.  Since last  clinic visit, she reports that she has been doing well.  Denies any chest pain, dyspnea, lightheadedness, syncope, lower extremity edema, or palpitations.  Has not been exercising regularly.  Has been taking DAPT, denies any bleeding issues.    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 Neg 2013   CORONARY ATHERECTOMY  10/02/2020   CORONARY STENT INTERVENTION N/A 10/02/2020   Procedure: CORONARY STENT INTERVENTION;  Surgeon: Sherren Mocha, MD;  Location: [facility];  Service: Cardiovascular;  Laterality: N/A;   DILATION AND CURETTAGE OF UTERUS     age 81   EYE SURGERY     G 2 P  2     INTRAVASCULAR ULTRASOUND/IVUS N/A 10/02/2020   Procedure: Intravascular Ultrasound/IVUS;  Surgeon: Sherren Mocha, MD;  Location: [facility];  Service: Cardiovascular;  Laterality: N/A;   RIGHT/LEFT HEART CATH AND CORONARY ANGIOGRAPHY N/A 08/26/2020   Procedure: RIGHT/LEFT HEART CATH AND CORONARY ANGIOGRAPHY;  Surgeon: Sherren Mocha, MD;  Location: [facility];  Service: Cardiovascular;  Laterality: N/A;   TEE WITHOUT CARDIOVERSION N/A 10/20/2020   Procedure: TRANSESOPHAGEAL ECHOCARDIOGRAM (TEE);  Surgeon: Sherren Mocha, MD;  Location: [facility];  Service: Open Heart Surgery;  Laterality: N/A;   TONSILLECTOMY AND ADENOIDECTOMY     TRANSCATHETER AORTIC VALVE REPLACEMENT, TRANSFEMORAL N/A  10/20/2020   Procedure: TRANSCATHETER AORTIC VALVE REPLACEMENT, TRANSFEMORAL;  Surgeon: Sherren Mocha, MD;  Location: [facility];  Service: Open Heart Surgery;  Laterality: N/A;   ULTRASOUND GUIDANCE FOR VASCULAR ACCESS Bilateral 10/20/2020   Procedure: ULTRASOUND GUIDANCE FOR VASCULAR ACCESS;  Surgeon: Sherren Mocha, MD;  Location: [facility];  Service: Open Heart Surgery;  Laterality: Bilateral;    Current Medications: Current Meds  Medication Sig   acetaminophen (TYLENOL) 500 MG tablet Take 1,000 mg by mouth every 6 (six) hours as needed for moderate pain.   amLODipine (NORVASC) 5 MG tablet TAKE 1.5 TABLETS BY MOUTH DAILY   aspirin EC 81 MG tablet Take 1 tablet (81 mg total) by mouth daily. Swallow whole.   BINAXNOW COVID-19 AG HOME TEST KIT TEST AS DIRECTED TODAY   candesartan (ATACAND) 32 MG tablet Take 1 tablet (32 mg total) by mouth daily.   carboxymethylcellulose (REFRESH PLUS) 0.5 % SOLN Place 1 drop into both eyes 3 (three) times daily as needed (dry eyes).   carvedilol (COREG) 6.25 MG tablet Take 1 tablet (6.25 mg total) by mouth 2 (two) times daily.   clopidogrel (PLAVIX) 75 MG tablet Take 1 tablet (75 mg total) by mouth daily.   ezetimibe (ZETIA) 10 MG tablet Take 1 tablet (10 mg total) by mouth daily.   fluticasone (FLONASE) 50 MCG/ACT nasal spray Place 1 spray into both nostrils daily as needed for allergies or rhinitis.   Multiple Minerals-Vitamins (CALCIUM & VIT D3 BONE HEALTH PO) Take 2 each by mouth daily. Chewables   Multiple Vitamins-Minerals (MULTIVITAMIN WITH MINERALS) tablet Take 2 tablets by mouth daily. Chewables   rosuvastatin (CRESTOR) 10 MG tablet TAKE 1 TABLET(10 MG) BY MOUTH DAILY     Allergies:   Patient has no known allergies.   Social History   Socioeconomic History   Marital status: Married    Spouse name: Not on file   Number of children: 2   Years of education: Not on file   Highest education level: Not on file  Occupational History   Not on file   Tobacco Use   Smoking status: Never   Smokeless tobacco: Never  Vaping Use   Vaping Use: Never used  Substance and Sexual Activity   Alcohol use: Yes    Comment: maybe 1 drink a month   Drug use: No   Sexual activity: Not Currently  Other Topics Concern   Not on file  Social History Narrative   Not on file   Social Determinants of Health   Financial Resource Strain: Not on file  Food Insecurity: Not on file  Transportation Needs: Not on file  Physical Activity: Not on file  Stress: Not on file  Social Connections: Not on file     Family History: The patient's family history includes Heart attack (age of onset: 7) in her  father; Hypertension in her mother; Stomach cancer in her mother. There is no history of Colon cancer, Colon polyps, Rectal cancer, Diabetes, or Stroke.  ROS:   Please see the history of present illness.     All other systems reviewed and are negative.  EKGs/Labs/Other Studies Reviewed:    The following studies were reviewed today:   EKG:  EKG is ordered today.  The ekg ordered today demonstrates sinus rhythm, rate 72, nonspecific T wave flattening  Recent Labs: 07/22/2020: NT-Pro BNP 1,512 10/21/2020: Magnesium 1.8 05/07/2021: ALT 21; BUN 18; Creatinine, Ser 0.65; Hemoglobin 13.5; Platelets 244.0; Potassium 4.3; Sodium 136; TSH 1.53  Recent Lipid Panel    Component Value Date/Time   CHOL 143 05/07/2021 1342   CHOL 142 12/30/2020 0832   TRIG 94.0 05/07/2021 1342   HDL 69.50 05/07/2021 1342   HDL 65 12/30/2020 0832   CHOLHDL 2 05/07/2021 1342   VLDL 18.8 05/07/2021 1342   LDLCALC 55 05/07/2021 1342   LDLCALC 64 12/30/2020 0832   LDLCALC 164 (H) 05/04/2020 1007   LDLDIRECT 156.8 10/24/2012 1207    Physical Exam:    VS:  BP 126/72   Pulse 72   Ht '4\' 11"'  (1.499 m)   Wt 100 lb (45.4 kg)   SpO2 97%   BMI 20.20 kg/m     Wt Readings from Last 3 Encounters:  07/20/21 100 lb (45.4 kg)  05/07/21 100 lb 6.4 oz (45.5 kg)  04/16/21 99 lb (44.9  kg)     GEN:  in no acute distress HEENT: Normal NECK: No JVD CARDIAC: RRR, 2/6 systolic murmur loudest at RUSB RESPIRATORY:  Clear to auscultation without rales, wheezing or rhonchi  ABDOMEN: Soft, non-tender, non-distended MUSCULOSKELETAL:  No edema; No deformity  SKIN: Warm and dry NEUROLOGIC:  Alert and oriented x 3 PSYCHIATRIC:  Normal affect   ASSESSMENT:    1. S/P TAVR (transcatheter aortic valve replacement)   2. Coronary artery disease involving native coronary artery of native heart without angina pectoris   3. Essential hypertension   4. Mixed hyperlipidemia      PLAN:    Aortic stenosis s/p TAVR 10/20/20: Cath on 08/26/2020 showed severe aortic stenosis with mean gradient 53 mmHg.  Underwent TAVR on 03/29/4165, no complications.  Echocardiogram on 11/25/2020 showed EF 70 to 06%, grade 1 diastolic dysfunction, normal RV function, 20 mm sapient 3 valve with mean gradient 18 mmHg.  No symptoms to suggest valve dysfunction.  CAD: Cath on 08/26/2020 showed severe two-vessel CAD (80% proximal RCA, 90% proximal LAD, 80% mid LAD, 50% mid LAD) and severe aortic stenosis with mean gradient 53 mmHg.  Underwent PCI to the RCA on 09/1120, with plan to perform PCI on LAD after TAVR.  Underwent TAVR on 3/0/1601, no complications. Plan was medical management and consider PCI if having symptoms.  Reports no anginal symptoms.  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  Asymptomatic.  Follows with Dr. Donzetta Matters in vascular surgery, monitoring  Hypertension: On candesartan 32 mg daily, amlodipine 7.5 mg daily, Coreg 6.25 mg twice daily.  Appears controlled  Hyperlipidemia: On rosuvastatin 10 mg daily and Zetia 10 mg daily.  LDL 64 on 12/30/2020   RTC in 6 months    Medication Adjustments/Labs and Tests Ordered: Current medicines are reviewed at length with the patient today.  Concerns regarding medicines are outlined  above.  No orders of the defined types were placed in  this encounter.   No orders of the defined types were placed in this encounter.   Patient Instructions  Medication Instructions:  Your physician recommends that you continue on your current medications as directed. Please refer to the Current Medication list given to you today.  *If you need a refill on your cardiac medications before your next appointment, please call your pharmacy*  Follow-Up: At [facility], you and your health needs are our priority.  As part of our continuing mission to provide you with exceptional heart care, we have created designated Provider Care Teams.  These Care Teams include your primary Cardiologist (physician) and Advanced Practice Providers (APPs -  Physician Assistants and Nurse Practitioners) who all work together to provide you with the care you need, when you need it.  We recommend signing up for the patient portal called "MyChart".  Sign up information is provided on this After Visit Summary.  MyChart is used to connect with patients for Virtual Visits (Telemedicine).  Patients are able to view lab/test results, encounter notes, upcoming appointments, etc.  Non-urgent messages can be sent to your provider as well.   To learn more about what you can do with MyChart, go to [email or web address].    Your next appointment:   6 month(s)  The format for your next appointment:   In Person  Provider:   Donato Heinz, MD        Signed, Donato Heinz, MD  07/20/2021 5:40 PM    [place]

## 2021-07-20 ENCOUNTER — Encounter: Payer: Self-pay | Admitting: Cardiology

## 2021-07-20 ENCOUNTER — Other Ambulatory Visit: Payer: Self-pay

## 2021-07-20 ENCOUNTER — Ambulatory Visit: Payer: Medicare Other | Admitting: Cardiology

## 2021-07-20 VITALS — BP 126/72 | HR 72 | Ht 59.0 in | Wt 100.0 lb

## 2021-07-20 DIAGNOSIS — E782 Mixed hyperlipidemia: Secondary | ICD-10-CM | POA: Diagnosis not present

## 2021-07-20 DIAGNOSIS — I10 Essential (primary) hypertension: Secondary | ICD-10-CM | POA: Diagnosis not present

## 2021-07-20 DIAGNOSIS — Z952 Presence of prosthetic heart valve: Secondary | ICD-10-CM

## 2021-07-20 DIAGNOSIS — I2510 Atherosclerotic heart disease of native coronary artery without angina pectoris: Secondary | ICD-10-CM

## 2021-07-20 NOTE — Patient Instructions (Signed)
Medication Instructions:  Your physician recommends that you continue on your current medications as directed. Please refer to the Current Medication list given to you today.  *If you need a refill on your cardiac medications before your next appointment, please call your pharmacy*  Follow-Up: At [facility], you and your health needs are our priority.  As part of our continuing mission to provide you with exceptional heart care, we have created designated Provider Care Teams.  These Care Teams include your primary Cardiologist (physician) and Advanced Practice Providers (APPs -  Physician Assistants and Nurse Practitioners) who all work together to provide you with the care you need, when you need it.  We recommend signing up for the patient portal called "MyChart".  Sign up information is provided on this After Visit Summary.  MyChart is used to connect with patients for Virtual Visits (Telemedicine).  Patients are able to view lab/test results, encounter notes, upcoming appointments, etc.  Non-urgent messages can be sent to your provider as well.   To learn more about what you can do with MyChart, go to [email or web address].    Your next appointment:   6 month(s)  The format for your next appointment:   In Person  Provider:   Donato Heinz, MD

## 2021-08-05 ENCOUNTER — Ambulatory Visit (INDEPENDENT_AMBULATORY_CARE_PROVIDER_SITE_OTHER): Payer: Medicare Other

## 2021-08-05 ENCOUNTER — Other Ambulatory Visit: Payer: Self-pay

## 2021-08-05 DIAGNOSIS — M810 Age-related osteoporosis without current pathological fracture: Secondary | ICD-10-CM | POA: Diagnosis not present

## 2021-08-05 MED ORDER — DENOSUMAB 60 MG/ML ~~LOC~~ SOSY
60.0000 mg | PREFILLED_SYRINGE | Freq: Once | SUBCUTANEOUS | Status: AC
Start: 2021-08-05 — End: 2021-08-05
  Administered 2021-08-05: 60 mg via SUBCUTANEOUS

## 2021-08-05 NOTE — Progress Notes (Signed)
Pt came into the office to receive her prolia injection. She tolerated the injection well.

## 2021-08-15 ENCOUNTER — Other Ambulatory Visit: Payer: Self-pay | Admitting: Cardiovascular Disease

## 2021-08-15 DIAGNOSIS — I350 Nonrheumatic aortic (valve) stenosis: Secondary | ICD-10-CM

## 2021-08-31 ENCOUNTER — Other Ambulatory Visit: Payer: Self-pay | Admitting: Physician Assistant

## 2021-08-31 DIAGNOSIS — Z952 Presence of prosthetic heart valve: Secondary | ICD-10-CM

## 2021-09-04 NOTE — Telephone Encounter (Signed)
Last Prolia inj 08/05/21 ?Next Prolia inj due 02/04/22 ?

## 2021-09-29 ENCOUNTER — Other Ambulatory Visit: Payer: Self-pay

## 2021-09-29 ENCOUNTER — Ambulatory Visit: Payer: Medicare Other | Admitting: Physician Assistant

## 2021-09-29 ENCOUNTER — Ambulatory Visit (HOSPITAL_COMMUNITY): Payer: Medicare Other | Attending: Cardiology

## 2021-09-29 VITALS — BP 150/60 | HR 74 | Ht 59.0 in | Wt 99.0 lb

## 2021-09-29 DIAGNOSIS — I10 Essential (primary) hypertension: Secondary | ICD-10-CM | POA: Diagnosis not present

## 2021-09-29 DIAGNOSIS — I2510 Atherosclerotic heart disease of native coronary artery without angina pectoris: Secondary | ICD-10-CM | POA: Diagnosis not present

## 2021-09-29 DIAGNOSIS — I719 Aortic aneurysm of unspecified site, without rupture: Secondary | ICD-10-CM | POA: Diagnosis not present

## 2021-09-29 DIAGNOSIS — E785 Hyperlipidemia, unspecified: Secondary | ICD-10-CM

## 2021-09-29 DIAGNOSIS — I350 Nonrheumatic aortic (valve) stenosis: Secondary | ICD-10-CM | POA: Diagnosis not present

## 2021-09-29 DIAGNOSIS — Z952 Presence of prosthetic heart valve: Secondary | ICD-10-CM | POA: Insufficient documentation

## 2021-09-29 LAB — ECHOCARDIOGRAM COMPLETE
AR max vel: 0.8 cm2
AV Area VTI: 0.75 cm2
AV Area mean vel: 0.84 cm2
AV Mean grad: 16 mmHg
AV Peak grad: 32.1 mmHg
Ao pk vel: 2.83 m/s
Area-P 1/2: 2.07 cm2
P 1/2 time: 605 msec
S' Lateral: 2.3 cm

## 2021-09-29 MED ORDER — AMOXICILLIN 500 MG PO TABS: ORAL_TABLET | ORAL | 6 refills | Status: DC

## 2021-09-29 NOTE — Progress Notes (Signed)
HEART AND [place]                                     Cardiology Office Note:    Date:  09/30/2021   ID:  Renee Figueroa, DOB 1934/03/10, MRN [id number]  PCP:  Binnie Rail, MD  [facility] Cardiologist:  Donato Heinz, MD / Dr. Burt Knack & Dr. Roxy Manns (TAVR) [facility] Electrophysiologist:  None   Referring MD: Binnie Rail, MD   CC: 1 year s/p TAVR  History of Present Illness:    Renee Figueroa is a 88 y.o. female with a hx of  CAD s/p DES to RCA and known high grade LAD lesion (unvascularized), HTN, HLD, osteoporosis, arthritis and severe AS s/p TAVR (10/20/20) who presents to clinic for follow up.   Pt reports having a murmur since birth. She was noted to have a louder heart murmur by her PCP recently and underwent 2D echocardiogram on 05/20/2020. This showed a mean gradient across aortic valve of 40 mmHg with a peak gradient of 61 mmHg.  Aortic valve area was 0.9 cm.  Left ventricular ejection fraction was 70 to 75% with hyperdynamic function and mild LVH with grade 1 diastolic dysfunction. There was felt to be severe mid cavitary obstruction with a mid cavitary gradient of 90 mmHg.  She was evaluated by Dr. Burt Knack and was found to have a BNP of 1512. She underwent cardiac catheterization on 08/26/2020 showing severe two-vessel coronary disease with 90% proximal and 80% mid LAD stenosis as well as 80% proximal RCA stenosis.  The mean gradient across the aortic valve was measured at 53 mmHg with a peak to peak gradient of 79 mmHg. Calculated aortic valve area was 0.48 cm. She was evaluated by Dr. Cyndia Bent who recommended PCI followed by TAVR.    She was brough back to the cath lab on 10/02/20 for two vessel PCI. She underwent successful PCI of the RCA; however, procedure was complicated by prolonged hypotension following stenting likely related to the presence of very severe aortic stenosis. Her residual severe proximal LAD stenosis  requiring atherectomy and PCI was left as Dr. Burt Knack did not feel like it was safe to proceed in the setting of severe AS. Plans were made to proceed with TAVR using a 20 mm Edwards S3U to ensure coronary access later.    She was evaluated by the multidisciplinary valve team and underwent successful TAVR with a 20 mm Edwards Sapien 3 Ultra  THV via the TF approach on 10/20/20. Post operative echo showed EF 65%, normally functioning TAVR with a mean gradient of 15 mmHg and no PVL. She was continued on aspirin and plavix. She has done well in follow up.  1 month echo showed EF 70%, normally functioning TAVR with a mean gradient of 18 mm hg and no PVL. Patient was reluctant to proceed with staged PCI to LAD. Discussed with Dr. Burt Knack and given lack of symptoms, we chose medical therapy.   Today she presents to clinic for follow up. No CP or SOB. No LE edema, orthopnea or PND. No dizziness or syncope. No blood in stool or urine. No palpitations. She stays very active around the house and in her church. She has slowed down recently due to decreased motivation vs exercise intolerance.    Past Medical History:  Diagnosis Date   Allergy  seasonal   Arthritis    mild   Basal cell carcinoma of anterior chest    skin   Colon polyps    adenomatous   Coronary artery disease    Diverticulosis    Heart murmur    Herpes zoster 04/2011   C 6  LUE   Hyperlipidemia    Hypertension    Osteoporosis    Renal calculi 1962    X 1    S/P TAVR (transcatheter aortic valve replacement) 10/20/2020   s/p TAVR with a 20 mm Edwards S3U via the TF approach by Drs Burt Knack and Roxy Manns    Past Surgical History:  Procedure Laterality Date   CARDIAC CATHETERIZATION     cataract     colonoscopy with polypectomy  2004    adenomatous; Dr Henrene Pastor. Neg 2013   CORONARY ATHERECTOMY  10/02/2020   CORONARY STENT INTERVENTION N/A 10/02/2020   Procedure: CORONARY STENT INTERVENTION;  Surgeon: Sherren Mocha, MD;  Location: [place] [facility];  Service: Cardiovascular;  Laterality: N/A;   DILATION AND CURETTAGE OF UTERUS     age 20   EYE SURGERY     G 2 P 2     INTRAVASCULAR ULTRASOUND/IVUS N/A 10/02/2020   Procedure: Intravascular Ultrasound/IVUS;  Surgeon: Sherren Mocha, MD;  Location: [place] [facility];  Service: Cardiovascular;  Laterality: N/A;   RIGHT/LEFT HEART CATH AND CORONARY ANGIOGRAPHY N/A 08/26/2020   Procedure: RIGHT/LEFT HEART CATH AND CORONARY ANGIOGRAPHY;  Surgeon: Sherren Mocha, MD;  Location: [place] [facility];  Service: Cardiovascular;  Laterality: N/A;   TEE WITHOUT CARDIOVERSION N/A 10/20/2020   Procedure: TRANSESOPHAGEAL ECHOCARDIOGRAM (TEE);  Surgeon: Sherren Mocha, MD;  Location: [place];  Service: Open Heart Surgery;  Laterality: N/A;   TONSILLECTOMY AND ADENOIDECTOMY     TRANSCATHETER AORTIC VALVE REPLACEMENT, TRANSFEMORAL N/A 10/20/2020   Procedure: TRANSCATHETER AORTIC VALVE REPLACEMENT, TRANSFEMORAL;  Surgeon: Sherren Mocha, MD;  Location: [place];  Service: Open Heart Surgery;  Laterality: N/A;   ULTRASOUND GUIDANCE FOR VASCULAR ACCESS Bilateral 10/20/2020   Procedure: ULTRASOUND GUIDANCE FOR VASCULAR ACCESS;  Surgeon: Sherren Mocha, MD;  Location: [place];  Service: Open Heart Surgery;  Laterality: Bilateral;    Current Medications: Current Meds  Medication Sig   acetaminophen (TYLENOL) 500 MG tablet Take 1,000 mg by mouth every 6 (six) hours as needed for moderate pain.   amLODipine (NORVASC) 5 MG tablet TAKE 1.5 TABLETS BY MOUTH DAILY   amoxicillin (AMOXIL) 500 MG tablet Take 4 tablets by mouth 1 hour  prior to dental procedures and cleanings   aspirin EC 81 MG tablet Take 81 mg by mouth daily. Swallow whole.   BINAXNOW COVID-19 AG HOME TEST KIT TEST AS DIRECTED TODAY   candesartan (ATACAND) 32 MG tablet Take 1 tablet (32 mg total) by mouth daily.   carboxymethylcellulose (REFRESH PLUS) 0.5 % SOLN Place 1 drop into both eyes 3 (three) times daily as needed (dry eyes).   carvedilol  (COREG) 6.25 MG tablet Take 1 tablet (6.25 mg total) by mouth 2 (two) times daily.   ezetimibe (ZETIA) 10 MG tablet Take 1 tablet (10 mg total) by mouth daily.   fluticasone (FLONASE) 50 MCG/ACT nasal spray Place 1 spray into both nostrils daily as needed for allergies or rhinitis.   Multiple Minerals-Vitamins (CALCIUM & VIT D3 BONE HEALTH PO) Take 2 each by mouth daily. Chewables   Multiple Vitamins-Minerals (MULTIVITAMIN WITH MINERALS) tablet Take 2 tablets by mouth daily. Chewables   rosuvastatin (CRESTOR) 10 MG  tablet TAKE 1 TABLET(10 MG) BY MOUTH DAILY   [DISCONTINUED] amoxicillin (AMOXIL) 500 MG tablet Take 500 mg by mouth 2 (two) times daily.   [DISCONTINUED] clopidogrel (PLAVIX) 75 MG tablet TAKE 1 TABLET(75 MG) BY MOUTH DAILY     Allergies:   Patient has no known allergies.   Social History   Socioeconomic History   Marital status: Married    Spouse name: Not on file   Number of children: 2   Years of education: Not on file   Highest education level: Not on file  Occupational History   Not on file  Tobacco Use   Smoking status: Never   Smokeless tobacco: Never  Vaping Use   Vaping Use: Never used  Substance and Sexual Activity   Alcohol use: Yes    Comment: maybe 1 drink a month   Drug use: No   Sexual activity: Not Currently  Other Topics Concern   Not on file  Social History Narrative   Not on file   Social Determinants of Health   Financial Resource Strain: Not on file  Food Insecurity: Not on file  Transportation Needs: Not on file  Physical Activity: Not on file  Stress: Not on file  Social Connections: Not on file     Family History: The patient's family history includes Heart attack (age of onset: 40) in her father; Hypertension in her mother; Stomach cancer in her mother. There is no history of Colon cancer, Colon polyps, Rectal cancer, Diabetes, or Stroke.  ROS:   Please see the history of present illness.    All other systems reviewed and are  negative.  EKGs/Labs/Other Studies Reviewed:    The following studies were reviewed today:   TAVR OPERATIVE NOTE     Date of Procedure:                10/20/2020   Preoperative Diagnosis:      Severe Aortic Stenosis    Postoperative Diagnosis:    Same    Procedure:        Transcatheter Aortic Valve Replacement - Percutaneous  Transfemoral Approach             Edwards Sapien 3 THV (size 20 mm, model # P825213, serial # [id number])              Co-Surgeons:                        Valentina Gu. Roxy Manns, MD and Sherren Mocha, MD   Anesthesiologist:                  Belinda Block, MD   Echocardiographer:              Jenkins Rouge, MD   Pre-operative Echo Findings: Severe aortic stenosis Normal left ventricular systolic function   Post-operative Echo Findings: No paravalvular leak Normal/unchanged left ventricular systolic function   _____________   Echo 10/21/20:  IMPRESSIONS   1. Left ventricular ejection fraction, by estimation, is 65 to 70%. The  left ventricle has normal function. There is moderate left ventricular  hypertrophy.   2. The mitral valve is abnormal. Mild mitral valve regurgitation.   3. Post TAVR with 20 mm Sapien 3 Ultra no PVL mean gradient 15 peak 30  mmHg with AVA 1.35 cm2 and DVI 0.64 . The aortic valve has been  repaired/replaced.   4. There is normal pulmonary artery systolic pressure.     ____________________  Echo 11/25/20 IMPRESSIONS  1. Left ventricular ejection fraction, by estimation, is 70 to 75%. The left ventricle has hyperdynamic function. The left ventricle has no regional wall motion abnormalities. Left ventricular diastolic parameters are consistent with Grade I diastolic  dysfunction (impaired relaxation). Elevated left ventricular end-diastolic pressure. 3D left ventricular ejection fraction analysis performed but not reported based on interpreter judgement due to suboptimal quality.  2. Right ventricular systolic function is normal. The  right ventricular size is normal. There is normal pulmonary artery systolic pressure.  3. The mitral valve is normal in structure. Mild mitral valve regurgitation. No evidence of mitral stenosis.  4. The aortic valve has been repaired/replaced. There is a 20 mm Sapien prosthetic, stented (TAVR) valve present in the aortic position. Aortic valve regurgitation is not visualized. No aortic stenosis is present. Aortic valve mean gradient measures  18.0 mmHg. Aortic valve Vmax measures 3.08 m/s. DI 0.48.  5. The inferior vena cava is normal in size with greater than 50% respiratory variability, suggesting right atrial pressure of 3 mmHg.  6. Left atrial size was mildly dilated.  7. Compared to prior echo, mean AVG has slightly increased from 15 to 62mHg, DVI has decreased from 0.64 to 0.48.   __________________________  Echo 09/29/21 IMPRESSIONS   1. Left ventricular ejection fraction, by estimation, is 65 to 70%. The  left ventricle has hyperdynamic function. The left ventricle has no  regional wall motion abnormalities. There is mild left ventricular  hypertrophy. Left ventricular diastolic  parameters are consistent with Grade I diastolic dysfunction (impaired  relaxation).   2. Right ventricular systolic function is normal. The right ventricular  size is normal. There is normal pulmonary artery systolic pressure. The  estimated right ventricular systolic pressure is 327.7mmHg.   3. The mitral valve is normal in structure. Mild mitral valve  regurgitation. No evidence of mitral stenosis.   4. Bioprosthetic aortic valve s/p TAVR. Mild peri-valvular leakage. Mean  gradient 16 mmHg. .   5. The inferior vena cava is normal in size with greater than 50%  respiratory variability, suggesting right atrial pressure of 3 mmHg.   EKG:  EKG is NOT ordered today.  Recent Labs: 10/21/2020: Magnesium 1.8 05/07/2021: ALT 21; BUN 18; Creatinine, Ser 0.65; Hemoglobin 13.5; Platelets 244.0; Potassium 4.3;  Sodium 136; TSH 1.53  Recent Lipid Panel    Component Value Date/Time   CHOL 143 05/07/2021 1342   CHOL 142 12/30/2020 0832   TRIG 94.0 05/07/2021 1342   HDL 69.50 05/07/2021 1342   HDL 65 12/30/2020 0832   CHOLHDL 2 05/07/2021 1342   VLDL 18.8 05/07/2021 1342   LDLCALC 55 05/07/2021 1342   LDLCALC 64 12/30/2020 0832   LDLCALC 164 (H) 05/04/2020 1007   LDLDIRECT 156.8 10/24/2012 1207     Risk Assessment/Calculations:       Physical Exam:    VS:  BP (!) 150/60 (BP Location: Left Arm, Patient Position: Sitting, Cuff Size: Normal)    Pulse 74    Ht _0  (1.499 m)    Wt 99 lb (44.9 kg)    SpO2 94%    BMI 20.00 kg/m     Wt Readings from Last 3 Encounters:  09/29/21 99 lb (44.9 kg)  07/20/21 100 lb (45.4 kg)  05/07/21 100 lb 6.4 oz (45.5 kg)     GEN:  Well nourished, well developed in no acute distress HEENT: Normal NECK: No JVD; No carotid bruits LYMPHATICS: No lymphadenopathy CARDIAC: RRR, 2/6 SEM. no rubs,  gallops RESPIRATORY:  Clear to auscultation without rales, wheezing or rhonchi  ABDOMEN: Soft, non-tender, non-distended MUSCULOSKELETAL:  No edema; No deformity  SKIN: Warm and dry.   NEUROLOGIC:  Alert and oriented x 3 PSYCHIATRIC:  Normal affect   ASSESSMENT:    1. S/P TAVR (transcatheter aortic valve replacement)   2. Coronary artery disease involving native coronary artery of native heart without angina pectoris   3. Penetrating ulcer of aorta ([place])   4. Essential hypertension   5. Hyperlipidemia, unspecified hyperlipidemia type   6. Severe aortic stenosis     PLAN:    In order of problems listed above:  Severe AS s/p TAVR: echo today shows EF 65%, normally functioning TAVR with a mean gradient of 16 mm hg and mild PVL. Mild MR. She has NYHA class I symptoms. SBE prophylaxis discussed; she has amoxicillin. She is still on aspirin and plavix. Will discontinue at Plavix at this time. Continue regular follow up with Dr. Gardiner Rhyme.   CAD: pre TAVR cath  showed severe two-vessel coronary disease with 90% proximal and 80% mid LAD stenosis as well as 80% proximal RCA stenosis. She underwent successful PCI of the RCA on 10/02/20; however, procedure was complicated by prolonged hypotension following stenting likely related to the presence of very severe aortic stenosis. Her residual severe proximal LAD stenosis requiring atherectomy and PCI was deferred as Dr. Burt Knack did not feel like it was safe to proceed. Plans were made to proceed with TAVR using a 20 mm Edwards S3U to ensure coronary access later. Given lack of chest pain, dyspnea or exercise intolerance, we elected to treat this medically.    Aortic penetrating ulcer: pre TAVR CT showed aortic atherosclerosis with penetrating ulcer measuring up to 8 mm in the infrarenal abdominal aorta. She is followed by Dr Donzetta Matters    HTN: BP elevated today. She has a long history of white coat HTN. Reviewed logs of BP at home which have been stable  HLD: continue statin and Zetia   Medication Adjustments/Labs and Tests Ordered: Current medicines are reviewed at length with the patient today.  Concerns regarding medicines are outlined above.  No orders of the defined types were placed in this encounter.  Meds ordered this encounter  Medications   amoxicillin (AMOXIL) 500 MG tablet    Sig: Take 4 tablets by mouth 1 hour  prior to dental procedures and cleanings    Dispense:  12 tablet    Refill:  6    Patient Instructions  Medication Instructions:  1) Stop Plavix   *If you need a refill on your cardiac medications before your next appointment, please call your pharmacy*   Lab Work: None ordered   If you have labs (blood work) drawn today and your tests are completely normal, you will receive your results only by: [place] (if you have MyChart) OR A paper copy in the mail If you have any lab test that is abnormal or we need to change your treatment, we will call you to review the  results.   Testing/Procedures: None ordered    Follow-Up: Follow up as scheduled }    Other Instructions None     Signed, Angelena Form, PA-C  09/30/2021 9:16 AM    [facility]

## 2021-09-29 NOTE — Patient Instructions (Signed)
Medication Instructions:  1) Stop Plavix   *If you need a refill on your cardiac medications before your next appointment, please call your pharmacy*   Lab Work: None ordered   If you have labs (blood work) drawn today and your tests are completely normal, you will receive your results only by: [place] (if you have MyChart) OR A paper copy in the mail If you have any lab test that is abnormal or we need to change your treatment, we will call you to review the results.   Testing/Procedures: None ordered    Follow-Up: Follow up as scheduled }    Other Instructions None

## 2021-11-01 ENCOUNTER — Other Ambulatory Visit: Payer: Self-pay | Admitting: Physician Assistant

## 2021-11-13 ENCOUNTER — Other Ambulatory Visit: Payer: Self-pay | Admitting: Cardiovascular Disease

## 2021-11-13 DIAGNOSIS — I350 Nonrheumatic aortic (valve) stenosis: Secondary | ICD-10-CM

## 2021-11-23 ENCOUNTER — Other Ambulatory Visit: Payer: Self-pay

## 2021-11-23 MED ORDER — CANDESARTAN CILEXETIL 32 MG PO TABS: 32.0000 mg | ORAL_TABLET | Freq: Every day | ORAL | 3 refills | Status: DC

## 2021-11-24 DIAGNOSIS — H524 Presbyopia: Secondary | ICD-10-CM | POA: Diagnosis not present

## 2021-11-24 DIAGNOSIS — Z961 Presence of intraocular lens: Secondary | ICD-10-CM | POA: Diagnosis not present

## 2021-11-24 DIAGNOSIS — H40053 Ocular hypertension, bilateral: Secondary | ICD-10-CM | POA: Diagnosis not present

## 2021-12-23 NOTE — Telephone Encounter (Signed)
Prolia VOB initiated via [email or web address] ? ?Last Prolia inj 08/05/21 ?Next Prolia inj due 02/04/22 ?

## 2021-12-27 NOTE — Progress Notes (Signed)
?Cardiology Office Note:   ? ?Date:  12/30/2021  ? ?ID:  Renee Figueroa, DOB 1933-11-08, MRN [id number] ? ?PCP:  Binnie Rail, MD  ?Cardiologist:  Donato Heinz, MD  ?Electrophysiologist:  None  ? ?Referring MD: Binnie Rail, MD  ? ?Chief Complaint  ?Patient presents with  ? Aortic Stenosis  ? ? ? ?History of Present Illness:   ? ?Renee Figueroa is a 88 y.o. female with a hx of hypertension, hyperlipidemia who presents for follow-up.  She was referred by Dr. Quay Burow for evaluation of aortic stenosis, initially seen on 06/10/2020.  She denies any chest pain, shortness of breath, lightheadedness, or syncope.  Denies any palpitations or lower extremity edema.  Reports she does not exercise regularly.  Most exertion she does is walking around her house or walking the dog.  Reports BP is usually under good control at home, typically 120s.  No smoking history.  Father died of MI at age 38. ? ?Echocardiogram on 05/20/2020 showed EF 70 to 50%, grade 1 diastolic dysfunction, severe mid cavitary obstruction (peak gradient 90 mmHg), normal RV function, severe aortic stenosis (mean gradient 40 mmHg).  Alternate imaging recommended to better assess aortic stenosis versus high flow state.  CTA was ordered, with aortic valve calcium score 1647, consistent with severe AS.  Coronary calcium score 476.  Proximal to mid LAD heavily calcified, unable to rule out obstructive CAD.  Cath on 08/26/2020 showed severe two-vessel CAD (80% proximal RCA, 90% proximal LAD, 80% mid LAD, 50% mid LAD) and severe aortic stenosis with mean gradient 53 mmHg.  Underwent PCI to the RCA on 09/1120, with plan to perform PCI on LAD after TAVR.  Underwent TAVR on 10/25/4654, no complications.  Echocardiogram on 11/25/2020 showed EF 70 to 81%, grade 1 diastolic dysfunction, normal RV function, 20 mm TAVR valve with mean gradient 18 mmHg.  Decision was made to hold off on PCI LAD unless having symptoms, just manage medically for now.  Echocardiogram  09/29/2021 showed EF 65 to 27%, grade 1 diastolic dysfunction, normal RV function, mild PVL, MG 16 mmHg. ? ?Since last clinic visit, she reports that she is doing well.  Reports BP at home has been 110s to 130s over 50s to 60s.  Denies any chest pain, dyspnea, lightheadedness, syncope, lower extremity edema, or palpitations.  She walks 3 days/week for at least 20 minutes, denies any exertional symptoms. ? ? ? ?Past Medical History:  ?Diagnosis Date  ? Allergy   ? seasonal  ? Arthritis   ? mild  ? Basal cell carcinoma of anterior chest   ? skin  ? Colon polyps   ? adenomatous  ? Coronary artery disease   ? Diverticulosis   ? Heart murmur   ? Herpes zoster 04/2011  ? C 6  LUE  ? Hyperlipidemia   ? Hypertension   ? Osteoporosis   ? Renal calculi 1962  ?  X 1   ? 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 s/p TAVR with a 20 mm Edwards S3U via the TF approach by Drs Burt Knack and Roxy Manns  ? ? ?Past Surgical History:  ?Procedure Laterality Date  ? CARDIAC CATHETERIZATION    ? cataract    ? colonoscopy with polypectomy  2004   ? adenomatous; Dr Henrene Pastor. Neg 2013  ? CORONARY ATHERECTOMY  10/02/2020  ? CORONARY STENT INTERVENTION N/A 10/02/2020  ? Procedure: CORONARY STENT INTERVENTION;  Surgeon: Sherren Mocha, MD;  Location: [facility];  Service: Cardiovascular;  Laterality: N/A;  ? DILATION AND CURETTAGE OF UTERUS    ? age 16  ? EYE SURGERY    ? G 2 P 2    ? INTRAVASCULAR ULTRASOUND/IVUS N/A 10/02/2020  ? Procedure: Intravascular Ultrasound/IVUS;  Surgeon: Sherren Mocha, MD;  Location: [place] [facility];  Service: Cardiovascular;  Laterality: N/A;  ? RIGHT/LEFT HEART CATH AND CORONARY ANGIOGRAPHY N/A 08/26/2020  ? Procedure: RIGHT/LEFT HEART CATH AND CORONARY ANGIOGRAPHY;  Surgeon: Sherren Mocha, MD;  Location: [facility];  Service: Cardiovascular;  Laterality: N/A;  ? TEE WITHOUT CARDIOVERSION N/A 10/20/2020  ? Procedure: TRANSESOPHAGEAL ECHOCARDIOGRAM (TEE);  Surgeon: Sherren Mocha, MD;   Location: [place];  Service: Open Heart Surgery;  Laterality: N/A;  ? TONSILLECTOMY AND ADENOIDECTOMY    ? TRANSCATHETER AORTIC VALVE REPLACEMENT, TRANSFEMORAL N/A 10/20/2020  ? Procedure: TRANSCATHETER AORTIC VALVE REPLACEMENT, TRANSFEMORAL;  Surgeon: Sherren Mocha, MD;  Location: [place];  Service: Open Heart Surgery;  Laterality: N/A;  ? ULTRASOUND GUIDANCE FOR VASCULAR ACCESS Bilateral 10/20/2020  ? Procedure: ULTRASOUND GUIDANCE FOR VASCULAR ACCESS;  Surgeon: Sherren Mocha, MD;  Location: [place];  Service: Open Heart Surgery;  Laterality: Bilateral;  ? ? ?Current Medications: ?Current Meds  ?Medication Sig  ? acetaminophen (TYLENOL) 500 MG tablet Take 1,000 mg by mouth every 6 (six) hours as needed for moderate pain.  ? amLODipine (NORVASC) 5 MG tablet TAKE 1.5 TABLETS BY MOUTH DAILY  ? amoxicillin (AMOXIL) 500 MG tablet Take 4 tablets by mouth 1 hour  prior to dental procedures and cleanings  ? aspirin EC 81 MG tablet Take 81 mg by mouth daily. Swallow whole.  ? candesartan (ATACAND) 32 MG tablet Take 1 tablet (32 mg total) by mouth daily.  ? carboxymethylcellulose (REFRESH PLUS) 0.5 % SOLN Place 1 drop into both eyes 3 (three) times daily as needed (dry eyes).  ? carvedilol (COREG) 6.25 MG tablet TAKE 1 TABLET(6.25 MG) BY MOUTH TWICE DAILY  ? ezetimibe (ZETIA) 10 MG tablet TAKE 1 TABLET(10 MG) BY MOUTH DAILY  ? fluticasone (FLONASE) 50 MCG/ACT nasal spray Place 1 spray into both nostrils daily as needed for allergies or rhinitis.  ? Multiple Minerals-Vitamins (CALCIUM & VIT D3 BONE HEALTH PO) Take 2 each by mouth daily. Chewables  ? Multiple Vitamins-Minerals (MULTIVITAMIN WITH MINERALS) tablet Take 2 tablets by mouth daily. Chewables  ? rosuvastatin (CRESTOR) 10 MG tablet TAKE 1 TABLET(10 MG) BY MOUTH DAILY  ?  ? ?Allergies:   Patient has no known allergies.  ? ?Social History  ? ?Socioeconomic History  ? Marital status: Married  ?  Spouse name: Not on file  ? Number of children: 2  ? Years of education: Not on  file  ? Highest education level: Not on file  ?Occupational History  ? Not on file  ?Tobacco Use  ? Smoking status: Never  ? Smokeless tobacco: Never  ?Vaping Use  ? Vaping Use: Never used  ?Substance and Sexual Activity  ? Alcohol use: Yes  ?  Comment: maybe 1 drink a month  ? Drug use: No  ? Sexual activity: Not Currently  ?Other Topics Concern  ? Not on file  ?Social History Narrative  ? Not on file  ? ?Social Determinants of Health  ? ?Financial Resource Strain: Not on file  ?Food Insecurity: Not on file  ?Transportation Needs: Not on file  ?Physical Activity: Not on file  ?Stress: Not on file  ?Social Connections: Not on file  ?  ? ?Family History: ?  The patient's family history includes Heart attack (age of onset: 41) in her father; Hypertension in her mother; Stomach cancer in her mother. There is no history of Colon cancer, Colon polyps, Rectal cancer, Diabetes, or Stroke. ? ?ROS:   ?Please see the history of present illness.    ? All other systems reviewed and are negative. ? ?EKGs/Labs/Other Studies Reviewed:   ? ?The following studies were reviewed today: ? ? ?EKG:   ?12/30/2021: Normal sinus rhythm, rate 62, no ST abnormalities ? ?Recent Labs: ?05/07/2021: ALT 21; BUN 18; Creatinine, Ser 0.65; Hemoglobin 13.5; Platelets 244.0; Potassium 4.3; Sodium 136; TSH 1.53  ?Recent Lipid Panel ?   ?Component Value Date/Time  ? CHOL 143 05/07/2021 1342  ? CHOL 142 12/30/2020 0832  ? TRIG 94.0 05/07/2021 1342  ? HDL 69.50 05/07/2021 1342  ? HDL 65 12/30/2020 0832  ? CHOLHDL 2 05/07/2021 1342  ? VLDL 18.8 05/07/2021 1342  ? LDLCALC 55 05/07/2021 1342  ? [place] 64 12/30/2020 0832  ? LDLCALC 164 (H) 05/04/2020 1007  ? LDLDIRECT 156.8 10/24/2012 1207  ? ? ?Physical Exam:   ? ?VS:  BP (!) 156/73   Pulse 62   Ht '4\' 11"'$  (1.499 m)   Wt 100 lb 9.6 oz (45.6 kg)   SpO2 99%   BMI 20.32 kg/m?    ? ?Wt Readings from Last 3 Encounters:  ?12/30/21 100 lb 9.6 oz (45.6 kg)  ?09/29/21 99 lb (44.9 kg)  ?07/20/21 100 lb (45.4 kg)  ?   ? ?GEN:  in no acute distress ?HEENT: Normal ?NECK: No JVD ?CARDIAC: RRR, 2/6 systolic murmur loudest at RUSB ?RESPIRATORY:  Clear to auscultation without rales, wheezing or rhonchi  ?ABDOMEN: Soft, non-tender, non-di

## 2021-12-30 ENCOUNTER — Ambulatory Visit (INDEPENDENT_AMBULATORY_CARE_PROVIDER_SITE_OTHER): Payer: Medicare Other | Admitting: Cardiology

## 2021-12-30 ENCOUNTER — Encounter: Payer: Self-pay | Admitting: Cardiology

## 2021-12-30 VITALS — BP 156/73 | HR 62 | Ht 59.0 in | Wt 100.6 lb

## 2021-12-30 DIAGNOSIS — I10 Essential (primary) hypertension: Secondary | ICD-10-CM

## 2021-12-30 DIAGNOSIS — E785 Hyperlipidemia, unspecified: Secondary | ICD-10-CM

## 2021-12-30 DIAGNOSIS — I2510 Atherosclerotic heart disease of native coronary artery without angina pectoris: Secondary | ICD-10-CM | POA: Diagnosis not present

## 2021-12-30 DIAGNOSIS — Z952 Presence of prosthetic heart valve: Secondary | ICD-10-CM | POA: Diagnosis not present

## 2021-12-30 NOTE — Patient Instructions (Addendum)
Medication Instructions:  ?Your physician recommends that you continue on your current medications as directed. Please refer to the Current Medication list given to you today. ? ?*If you need a refill on your cardiac medications before your next appointment, please call your pharmacy* ? ?Follow-Up: ?At [facility], you and your health needs are our priority.  As part of our continuing mission to provide you with exceptional heart care, we have created designated Provider Care Teams.  These Care Teams include your primary Cardiologist (physician) and Advanced Practice Providers (APPs -  Physician Assistants and Nurse Practitioners) who all work together to provide you with the care you need, when you need it. ? ?We recommend signing up for the patient portal called "MyChart".  Sign up information is provided on this After Visit Summary.  MyChart is used to connect with patients for Virtual Visits (Telemedicine).  Patients are able to view lab/test results, encounter notes, upcoming appointments, etc.  Non-urgent messages can be sent to your provider as well.   ?To learn more about what you can do with MyChart, go to [email or web address].   ? ?Your next appointment:   ?07/07/21 @ 3pm ? ?Provider:   ?Donato Heinz, MD { ?

## 2022-01-03 NOTE — Telephone Encounter (Signed)
Prior auth required for PROLIA  PA PROCESS DETAILS: Effective 08/22/2021 if the patient is new to Prolia, Prior authorization and Step Therapy are required & not on file. Please go to [email or web address] or call [phone] to initiate the prior authorization. For exception to the policy please visit [email or web address] and review Policy Number [id number] 

## 2022-01-20 ENCOUNTER — Other Ambulatory Visit: Payer: Self-pay | Admitting: Physician Assistant

## 2022-01-20 NOTE — Telephone Encounter (Signed)
Pt ready for scheduling on or after 02/04/22  Out-of-pocket cost due at time of visit: $296  Primary: [facility] Medicare Prolia co-insurance: 20% (approximately $276) Admin fee co-insurance: $20  Secondary: n/a Prolia co-insurance:  Admin fee co-insurance:   Deductible: does not apply  Prior Auth: APPROVED PA# [id number] Valid: 06/07/21-06/07/22  ** This summary of benefits is an estimation of the patient's out-of-pocket cost. Exact cost may vary based on individual plan coverage.

## 2022-01-21 NOTE — Telephone Encounter (Signed)
This is Dr. Schumann's pt 

## 2022-02-09 ENCOUNTER — Ambulatory Visit (INDEPENDENT_AMBULATORY_CARE_PROVIDER_SITE_OTHER): Payer: Medicare Other

## 2022-02-09 DIAGNOSIS — M810 Age-related osteoporosis without current pathological fracture: Secondary | ICD-10-CM

## 2022-02-09 MED ORDER — DENOSUMAB 60 MG/ML ~~LOC~~ SOSY
60.0000 mg | PREFILLED_SYRINGE | Freq: Once | SUBCUTANEOUS | Status: AC
  Administered 2022-02-09: 60 mg via SUBCUTANEOUS

## 2022-02-09 NOTE — Progress Notes (Cosign Needed)
Prolia Last Adminsiterd 08/05/2021 Administered Prolia 60 mg/ml Sub Q to right upper arm, ordered by PCP.  Patient tolerated injection will.

## 2022-02-19 NOTE — Telephone Encounter (Signed)
Last Prolia inj 02/09/22 Next Prolia inj due 08/12/22  Prior Auth: APPROVED PA# [id number] Valid: 06/07/21-06/07/22

## 2022-03-09 ENCOUNTER — Inpatient Hospital Stay: Admission: RE | Admit: 2022-03-09 | Payer: Medicare Other | Source: Ambulatory Visit

## 2022-03-29 ENCOUNTER — Ambulatory Visit (INDEPENDENT_AMBULATORY_CARE_PROVIDER_SITE_OTHER)
Admission: RE | Admit: 2022-03-29 | Discharge: 2022-03-29 | Disposition: A | Discharge status: Home / Self Care | Payer: Medicare Other | Source: Ambulatory Visit | Attending: Internal Medicine | Admitting: Internal Medicine

## 2022-03-29 DIAGNOSIS — M810 Age-related osteoporosis without current pathological fracture: Secondary | ICD-10-CM

## 2022-05-08 ENCOUNTER — Encounter: Payer: Self-pay | Admitting: Internal Medicine

## 2022-05-08 NOTE — Patient Instructions (Addendum)
Blood work was ordered.     Medications changes include :   none    Return in about 1 year (around 05/10/2023) for Physical Exam.   Health Maintenance, Female Adopting a healthy lifestyle and getting preventive care are important in promoting health and wellness. Ask your health care provider about: The right schedule for you to have regular tests and exams. Things you can do on your own to prevent diseases and keep yourself healthy. What should I know about diet, weight, and exercise? Eat a healthy diet  Eat a diet that includes plenty of vegetables, fruits, low-fat dairy products, and lean protein. Do not eat a lot of foods that are high in solid fats, added sugars, or sodium. Maintain a healthy weight Body mass index (BMI) is used to identify weight problems. It estimates body fat based on height and weight. Your health care provider can help determine your BMI and help you achieve or maintain a healthy weight. Get regular exercise Get regular exercise. This is one of the most important things you can do for your health. Most adults should: Exercise for at least 150 minutes each week. The exercise should increase your heart rate and make you sweat (moderate-intensity exercise). Do strengthening exercises at least twice a week. This is in addition to the moderate-intensity exercise. Spend less time sitting. Even light physical activity can be beneficial. Watch cholesterol and blood lipids Have your blood tested for lipids and cholesterol at 86 years of age, then have this test every 5 years. Have your cholesterol levels checked more often if: Your lipid or cholesterol levels are high. You are older than 86 years of age. You are at high risk for heart disease. What should I know about cancer screening? Depending on your health history and family history, you may need to have cancer screening at various ages. This may include screening for: Breast cancer. Cervical  cancer. Colorectal cancer. Skin cancer. Lung cancer. What should I know about heart disease, diabetes, and high blood pressure? Blood pressure and heart disease High blood pressure causes heart disease and increases the risk of stroke. This is more likely to develop in people who have high blood pressure readings or are overweight. Have your blood pressure checked: Every 3-5 years if you are 14-21 years of age. Every year if you are 51 years old or older. Diabetes Have regular diabetes screenings. This checks your fasting blood sugar level. Have the screening done: Once every three years after age 23 if you are at a normal weight and have a low risk for diabetes. More often and at a younger age if you are overweight or have a high risk for diabetes. What should I know about preventing infection? Hepatitis B If you have a higher risk for hepatitis B, you should be screened for this virus. Talk with your health care provider to find out if you are at risk for hepatitis B infection. Hepatitis C Testing is recommended for: Everyone born from [birth year] through 1963. Anyone with known risk factors for hepatitis C. Sexually transmitted infections (STIs) Get screened for STIs, including gonorrhea and chlamydia, if: You are sexually active and are younger than 86 years of age. You are older than 86 years of age and your health care provider tells you that you are at risk for this type of infection. Your sexual activity has changed since you were last screened, and you are at increased risk for chlamydia or gonorrhea. Ask your health care  provider if you are at risk. Ask your health care provider about whether you are at high risk for HIV. Your health care provider may recommend a prescription medicine to help prevent HIV infection. If you choose to take medicine to prevent HIV, you should first get tested for HIV. You should then be tested every 3 months for as long as you are taking the  medicine. Pregnancy If you are about to stop having your period (premenopausal) and you may become pregnant, seek counseling before you get pregnant. Take 400 to 800 micrograms (mcg) of folic acid every day if you become pregnant. Ask for birth control (contraception) if you want to prevent pregnancy. Osteoporosis and menopause Osteoporosis is a disease in which the bones lose minerals and strength with aging. This can result in bone fractures. If you are 37 years old or older, or if you are at risk for osteoporosis and fractures, ask your health care provider if you should: Be screened for bone loss. Take a calcium or vitamin D supplement to lower your risk of fractures. Be given hormone replacement therapy (HRT) to treat symptoms of menopause. Follow these instructions at home: Alcohol use Do not drink alcohol if: Your health care provider tells you not to drink. You are pregnant, may be pregnant, or are planning to become pregnant. If you drink alcohol: Limit how much you have to: 0-1 drink a day. Know how much alcohol is in your drink. In the [place] one drink equals one 12 oz bottle of beer (355 mL), one 5 oz glass of wine (148 mL), or one 1 oz glass of hard liquor (44 mL). Lifestyle Do not use any products that contain nicotine or tobacco. These products include cigarettes, chewing tobacco, and vaping devices, such as e-cigarettes. If you need help quitting, ask your health care provider. Do not use street drugs. Do not share needles. Ask your health care provider for help if you need support or information about quitting drugs. General instructions Schedule regular health, dental, and eye exams. Stay current with your vaccines. Tell your health care provider if: You often feel depressed. You have ever been abused or do not feel safe at home. Summary Adopting a healthy lifestyle and getting preventive care are important in promoting health and wellness. Follow your health care  provider's instructions about healthy diet, exercising, and getting tested or screened for diseases. Follow your health care provider's instructions on monitoring your cholesterol and blood pressure. This information is not intended to replace advice given to you by your health care provider. Make sure you discuss any questions you have with your health care provider. Document Revised: 12/28/2020 Document Reviewed: 12/28/2020 Elsevier Patient Education  [place].

## 2022-05-08 NOTE — Progress Notes (Unsigned)
Subjective:    Patient ID: Renee Figueroa, female    DOB: 1933/11/25, 88 y.o.   MRN: [id number]      HPI Renee Figueroa is here for a Physical exam.   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   Medications and allergies reviewed with patient and updated if appropriate.  Current Outpatient Medications on File Prior to Visit  Medication Sig Dispense Refill   acetaminophen (TYLENOL) 500 MG tablet Take 1,000 mg by mouth every 6 (six) hours as needed for moderate pain.     amLODipine (NORVASC) 5 MG tablet TAKE 1 AND 1/2 TABLETS BY MOUTH DAILY 135 tablet 3   amoxicillin (AMOXIL) 500 MG tablet Take 4 tablets by mouth 1 hour  prior to dental procedures and cleanings 12 tablet 6   aspirin EC 81 MG tablet Take 81 mg by mouth daily. Swallow whole.     BINAXNOW COVID-19 AG HOME TEST KIT TEST AS DIRECTED TODAY     candesartan (ATACAND) 32 MG tablet Take 1 tablet (32 mg total) by mouth daily. 90 tablet 3   carboxymethylcellulose (REFRESH PLUS) 0.5 % SOLN Place 1 drop into both eyes 3 (three) times daily as needed (dry eyes).     carvedilol (COREG) 6.25 MG tablet TAKE 1 TABLET(6.25 MG) BY MOUTH TWICE DAILY 180 tablet 3   ezetimibe (ZETIA) 10 MG tablet TAKE 1 TABLET(10 MG) BY MOUTH DAILY 90 tablet 3   fluticasone (FLONASE) 50 MCG/ACT nasal spray Place 1 spray into both nostrils daily as needed for allergies or rhinitis.     Multiple Minerals-Vitamins (CALCIUM & VIT D3 BONE HEALTH PO) Take 2 each by mouth daily. Chewables     Multiple Vitamins-Minerals (MULTIVITAMIN WITH MINERALS) tablet Take 2 tablets by mouth daily. Chewables     rosuvastatin (CRESTOR) 10 MG tablet TAKE 1 TABLET(10 MG) BY MOUTH DAILY 90 tablet 3   No current facility-administered medications on file prior to visit.    Review of Systems     Objective:  There were no vitals filed for this visit. There were no vitals filed for this visit. There is no height or weight on file to calculate BMI.  BP Readings from Last 3  Encounters:  12/30/21 (!) 156/73  09/29/21 (!) 150/60  07/20/21 126/72    Wt Readings from Last 3 Encounters:  12/30/21 100 lb 9.6 oz (45.6 kg)  09/29/21 99 lb (44.9 kg)  07/20/21 100 lb (45.4 kg)       Physical Exam Constitutional: She appears well-developed and well-nourished. No distress.  HENT:  Head: Normocephalic and atraumatic.  Right Ear: External ear normal. Normal ear canal and TM Left Ear: External ear normal.  Normal ear canal and TM Mouth/Throat: Oropharynx is clear and moist.  Eyes: Conjunctivae normal.  Neck: Neck supple. No tracheal deviation present. No thyromegaly present.  No carotid bruit  Cardiovascular: Normal rate, regular rhythm and normal heart sounds.   No murmur heard.  No edema. Pulmonary/Chest: Effort normal and breath sounds normal. No respiratory distress. She has no wheezes. She has no rales.  Breast: deferred   Abdominal: Soft. She exhibits no distension. There is no tenderness.  Lymphadenopathy: She has no cervical adenopathy.  Skin: Skin is warm and dry. She is not diaphoretic.  Psychiatric: She has a normal mood and affect. Her behavior is normal.     Lab Results  Component Value Date   WBC 9.0 05/07/2021   HGB 13.5 05/07/2021   HCT 40.4 05/07/2021  PLT 244.0 05/07/2021   GLUCOSE 88 05/07/2021   CHOL 143 05/07/2021   TRIG 94.0 05/07/2021   HDL 69.50 05/07/2021   LDLDIRECT 156.8 10/24/2012   LDLCALC 55 05/07/2021   ALT 21 05/07/2021   AST 25 05/07/2021   NA 136 05/07/2021   K 4.3 05/07/2021   CL 100 05/07/2021   CREATININE 0.65 05/07/2021   BUN 18 05/07/2021   CO2 28 05/07/2021   TSH 1.53 05/07/2021   INR 1.0 10/16/2020   HGBA1C 5.5 05/04/2020         Assessment & Plan:   Physical exam: Screening blood work  ordered Exercise   Weight   Substance abuse  none   Reviewed recommended immunizations.  Flu vaccine today   Health Maintenance  Topic Date Due   Zoster Vaccines- Shingrix (1 of 2) Never done    COVID-19 Vaccine (5 - Pfizer series) 06/23/2021   INFLUENZA VACCINE  03/22/2022   TETANUS/TDAP  10/25/2022   DEXA SCAN  03/29/2024   Pneumonia Vaccine 56+ Years old  Completed   HPV VACCINES  Aged Out          See Problem List for Assessment and Plan of chronic medical problems.

## 2022-05-09 ENCOUNTER — Other Ambulatory Visit: Payer: Self-pay | Admitting: Cardiology

## 2022-05-09 ENCOUNTER — Encounter: Payer: Medicare Other | Admitting: Internal Medicine

## 2022-05-09 ENCOUNTER — Ambulatory Visit (INDEPENDENT_AMBULATORY_CARE_PROVIDER_SITE_OTHER): Payer: Medicare Other | Admitting: Internal Medicine

## 2022-05-09 VITALS — BP 140/80 | HR 80 | Temp 98.0°F | Ht 59.0 in | Wt 101.2 lb

## 2022-05-09 DIAGNOSIS — I25118 Atherosclerotic heart disease of native coronary artery with other forms of angina pectoris: Secondary | ICD-10-CM

## 2022-05-09 DIAGNOSIS — E782 Mixed hyperlipidemia: Secondary | ICD-10-CM | POA: Diagnosis not present

## 2022-05-09 DIAGNOSIS — Z23 Encounter for immunization: Secondary | ICD-10-CM | POA: Diagnosis not present

## 2022-05-09 DIAGNOSIS — M810 Age-related osteoporosis without current pathological fracture: Secondary | ICD-10-CM | POA: Diagnosis not present

## 2022-05-09 DIAGNOSIS — Z0000 Encounter for general adult medical examination without abnormal findings: Secondary | ICD-10-CM | POA: Diagnosis not present

## 2022-05-09 DIAGNOSIS — I10 Essential (primary) hypertension: Secondary | ICD-10-CM

## 2022-05-09 LAB — LIPID PANEL
Cholesterol: 141 mg/dL (ref 0–200)
HDL: 63.6 mg/dL (ref >39.00)
LDL Cholesterol: 55 mg/dL (ref 0–99)
NonHDL: 77.38
Total CHOL/HDL Ratio: 2
Triglycerides: 112 mg/dL (ref 0.0–149.0)
VLDL: 22.4 mg/dL (ref 0.0–40.0)

## 2022-05-09 LAB — CBC WITH DIFFERENTIAL/PLATELET
Basophils Absolute: 0 10*3/uL (ref 0.0–0.1)
Basophils Relative: 0.4 % (ref 0.0–3.0)
Eosinophils Absolute: 0.2 10*3/uL (ref 0.0–0.7)
Eosinophils Relative: 2.4 % (ref 0.0–5.0)
HCT: 40.9 % (ref 36.0–46.0)
Hemoglobin: 13.7 g/dL (ref 12.0–15.0)
Lymphocytes Relative: 42.7 % (ref 12.0–46.0)
Lymphs Abs: 3.1 10*3/uL (ref 0.7–4.0)
MCHC: 33.4 g/dL (ref 30.0–36.0)
MCV: 92.8 fl (ref 78.0–100.0)
Monocytes Absolute: 0.5 10*3/uL (ref 0.1–1.0)
Monocytes Relative: 7 % (ref 3.0–12.0)
Neutro Abs: 3.4 10*3/uL (ref 1.4–7.7)
Neutrophils Relative %: 47.5 % (ref 43.0–77.0)
Platelets: 220 10*3/uL (ref 150.0–400.0)
RBC: 4.41 Mil/uL (ref 3.87–5.11)
RDW: 12.9 % (ref 11.5–15.5)
WBC: 7.2 10*3/uL (ref 4.0–10.5)

## 2022-05-09 LAB — COMPREHENSIVE METABOLIC PANEL
ALT: 16 U/L (ref 0–35)
AST: 20 U/L (ref 0–37)
Albumin: 4.5 g/dL (ref 3.5–5.2)
Alkaline Phosphatase: 42 U/L (ref 39–117)
BUN: 17 mg/dL (ref 6–23)
CO2: 28 mEq/L (ref 19–32)
Calcium: 9.8 mg/dL (ref 8.4–10.5)
Chloride: 102 mEq/L (ref 96–112)
Creatinine, Ser: 0.73 mg/dL (ref 0.40–1.20)
GFR: 74.44 mL/min (ref >60.00)
Glucose, Bld: 90 mg/dL (ref 70–99)
Potassium: 4.1 mEq/L (ref 3.5–5.1)
Sodium: 139 mEq/L (ref 135–145)
Total Bilirubin: 0.5 mg/dL (ref 0.2–1.2)
Total Protein: 7.7 g/dL (ref 6.0–8.3)

## 2022-05-09 LAB — VITAMIN D 25 HYDROXY (VIT D DEFICIENCY, FRACTURES): VITD: 75.2 ng/mL (ref 30.00–100.00)

## 2022-05-09 LAB — TSH: TSH: 1.73 u[IU]/mL (ref 0.35–5.50)

## 2022-05-09 NOTE — Assessment & Plan Note (Signed)
Chronic Regular exercise and healthy diet encouraged Check lipid panel  Continue Crestor 10 mg daily 

## 2022-05-09 NOTE — Assessment & Plan Note (Signed)
Chronic Blood pressure well controlled CMP Continue amlodipine 7.5 mg daily, Atacand 32 mg daily, Coreg 6.25 mg twice daily

## 2022-05-09 NOTE — Assessment & Plan Note (Addendum)
Chronic dexa up tod date - we reviewed this On prolia every 6 months-continue Continue calcium and vitamin D Check vitamin D level She is very active-has not been walking as much due to the heat, but will start to walk a little bit more-encouraged regular exercise Discussed fall prevention-she denies falls

## 2022-05-09 NOTE — Assessment & Plan Note (Signed)
Chronic Following with cardiology Denies any symptoms consistent with angina Continue current medications

## 2022-06-24 ENCOUNTER — Other Ambulatory Visit (HOSPITAL_COMMUNITY): Payer: Self-pay

## 2022-06-24 NOTE — Telephone Encounter (Signed)
Pharmacy Patient Advocate Encounter  Insurance verification completed.    The patient is insured through [employer]   Ran test claims for: Prolia '60mg'$ .  Pharmacy benefit copay: $300.00

## 2022-07-05 NOTE — Progress Notes (Unsigned)
Cardiology Office Note:    Date:  12/30/2021   ID:  Renee Figueroa, DOB 07-09-1934, MRN [id number]  PCP:  Binnie Rail, MD  Cardiologist:  Donato Heinz, MD  Electrophysiologist:  None   Referring MD: Binnie Rail, MD   Chief Complaint  Patient presents with   Aortic Stenosis     History of Present Illness:    Renee Figueroa is a 88 y.o. female with a hx of hypertension, hyperlipidemia who presents for follow-up.  She was referred by Dr. Quay Burow for evaluation of aortic stenosis, initially seen on 06/10/2020.  She denies any chest pain, shortness of breath, lightheadedness, or syncope.  Denies any palpitations or lower extremity edema.  Reports she does not exercise regularly.  Most exertion she does is walking around her house or walking the dog.  Reports BP is usually under good control at home, typically 120s.  No smoking history.  Father died of MI at age 36.  Echocardiogram on 05/20/2020 showed EF 70 to 32%, grade 1 diastolic dysfunction, severe mid cavitary obstruction (peak gradient 90 mmHg), normal RV function, severe aortic stenosis (mean gradient 40 mmHg).  Alternate imaging recommended to better assess aortic stenosis versus high flow state.  CTA was ordered, with aortic valve calcium score 1647, consistent with severe AS.  Coronary calcium score 476.  Proximal to mid LAD heavily calcified, unable to rule out obstructive CAD.  Cath on 08/26/2020 showed severe two-vessel CAD (80% proximal RCA, 90% proximal LAD, 80% mid LAD, 50% mid LAD) and severe aortic stenosis with mean gradient 53 mmHg.  Underwent PCI to the RCA on 09/1120, with plan to perform PCI on LAD after TAVR.  Underwent TAVR on 11/23/100, no complications.  Echocardiogram on 11/25/2020 showed EF 70 to 72%, grade 1 diastolic dysfunction, normal RV function, 20 mm TAVR valve with mean gradient 18 mmHg.  Decision was made to hold off on PCI LAD unless having symptoms, just manage medically for now.  Echocardiogram  09/29/2021 showed EF 65 to 53%, grade 1 diastolic dysfunction, normal RV function, mild PVL, MG 16 mmHg.  Since last clinic visit,  she reports that she is doing well.  Reports BP at home has been 110s to 130s over 50s to 60s.  Denies any chest pain, dyspnea, lightheadedness, syncope, lower extremity edema, or palpitations.  She walks 3 days/week for at least 20 minutes, denies any exertional symptoms.    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 Neg 2013   CORONARY ATHERECTOMY  10/02/2020   CORONARY STENT INTERVENTION N/A 10/02/2020   Procedure: CORONARY STENT INTERVENTION;  Surgeon: Sherren Mocha, MD;  Location: [facility];  Service: Cardiovascular;  Laterality: N/A;   DILATION AND CURETTAGE OF UTERUS     age 89   EYE SURGERY     G 2 P 2     INTRAVASCULAR ULTRASOUND/IVUS N/A 10/02/2020   Procedure: Intravascular Ultrasound/IVUS;  Surgeon: Sherren Mocha, MD;  Location: [facility];  Service: Cardiovascular;  Laterality: N/A;   RIGHT/LEFT HEART CATH AND CORONARY ANGIOGRAPHY N/A 08/26/2020   Procedure: RIGHT/LEFT HEART CATH AND CORONARY ANGIOGRAPHY;  Surgeon: Sherren Mocha, MD;  Location: [facility];  Service: Cardiovascular;  Laterality: N/A;   TEE WITHOUT CARDIOVERSION N/A 10/20/2020   Procedure: TRANSESOPHAGEAL ECHOCARDIOGRAM (TEE);  Surgeon: Sherren Mocha, MD;   Location: [facility];  Service: Open Heart Surgery;  Laterality: N/A;   TONSILLECTOMY AND ADENOIDECTOMY     TRANSCATHETER AORTIC VALVE REPLACEMENT, TRANSFEMORAL N/A 10/20/2020   Procedure: TRANSCATHETER AORTIC VALVE REPLACEMENT, TRANSFEMORAL;  Surgeon: Sherren Mocha, MD;  Location: [facility];  Service: Open Heart Surgery;  Laterality: N/A;   ULTRASOUND GUIDANCE FOR VASCULAR ACCESS Bilateral 10/20/2020   Procedure: ULTRASOUND GUIDANCE FOR VASCULAR ACCESS;  Surgeon: Sherren Mocha, MD;  Location: [facility];  Service: Open Heart Surgery;  Laterality: Bilateral;    Current Medications: Current Meds  Medication Sig   acetaminophen (TYLENOL) 500 MG tablet Take 1,000 mg by mouth every 6 (six) hours as needed for moderate pain.   amLODipine (NORVASC) 5 MG tablet TAKE 1.5 TABLETS BY MOUTH DAILY   amoxicillin (AMOXIL) 500 MG tablet Take 4 tablets by mouth 1 hour  prior to dental procedures and cleanings   aspirin EC 81 MG tablet Take 81 mg by mouth daily. Swallow whole.   candesartan (ATACAND) 32 MG tablet Take 1 tablet (32 mg total) by mouth daily.   carboxymethylcellulose (REFRESH PLUS) 0.5 % SOLN Place 1 drop into both eyes 3 (three) times daily as needed (dry eyes).   carvedilol (COREG) 6.25 MG tablet TAKE 1 TABLET(6.25 MG) BY MOUTH TWICE DAILY   ezetimibe (ZETIA) 10 MG tablet TAKE 1 TABLET(10 MG) BY MOUTH DAILY   fluticasone (FLONASE) 50 MCG/ACT nasal spray Place 1 spray into both nostrils daily as needed for allergies or rhinitis.   Multiple Minerals-Vitamins (CALCIUM & VIT D3 BONE HEALTH PO) Take 2 each by mouth daily. Chewables   Multiple Vitamins-Minerals (MULTIVITAMIN WITH MINERALS) tablet Take 2 tablets by mouth daily. Chewables   rosuvastatin (CRESTOR) 10 MG tablet TAKE 1 TABLET(10 MG) BY MOUTH DAILY     Allergies:   Patient has no known allergies.   Social History   Socioeconomic History   Marital status: Married    Spouse name: Not on file   Number of children: 2   Years of education: Not on  file   Highest education level: Not on file  Occupational History   Not on file  Tobacco Use   Smoking status: Never   Smokeless tobacco: Never  Vaping Use   Vaping Use: Never used  Substance and Sexual Activity   Alcohol use: Yes    Comment: maybe 1 drink a month   Drug use: No   Sexual activity: Not Currently  Other Topics Concern   Not on file  Social History Narrative   Not on file   Social Determinants of Health   Financial Resource Strain: Not on file  Food Insecurity: Not on file  Transportation Needs: Not on file  Physical Activity: Not on file  Stress: Not on file  Social Connections: Not on file  Family History: The patient's family history includes Heart attack (age of onset: 52) in her father; Hypertension in her mother; Stomach cancer in her mother. There is no history of Colon cancer, Colon polyps, Rectal cancer, Diabetes, or Stroke.  ROS:   Please see the history of present illness.     All other systems reviewed and are negative.  EKGs/Labs/Other Studies Reviewed:    The following studies were reviewed today:   EKG:   12/30/2021: Normal sinus rhythm, rate 62, no ST abnormalities  Recent Labs: 05/07/2021: ALT 21; BUN 18; Creatinine, Ser 0.65; Hemoglobin 13.5; Platelets 244.0; Potassium 4.3; Sodium 136; TSH 1.53  Recent Lipid Panel    Component Value Date/Time   CHOL 143 05/07/2021 1342   CHOL 142 12/30/2020 0832   TRIG 94.0 05/07/2021 1342   HDL 69.50 05/07/2021 1342   HDL 65 12/30/2020 0832   CHOLHDL 2 05/07/2021 1342   VLDL 18.8 05/07/2021 1342   LDLCALC 55 05/07/2021 1342   LDLCALC 64 12/30/2020 0832   LDLCALC 164 (H) 05/04/2020 1007   LDLDIRECT 156.8 10/24/2012 1207    Physical Exam:    VS:  BP (!) 156/73   Pulse 62   Ht '4\' 11"'$  (1.499 m)   Wt 100 lb 9.6 oz (45.6 kg)   SpO2 99%   BMI 20.32 kg/m     Wt Readings from Last 3 Encounters:  12/30/21 100 lb 9.6 oz (45.6 kg)  09/29/21 99 lb (44.9 kg)  07/20/21 100 lb (45.4 kg)      GEN:  in no acute distress HEENT: Normal NECK: No JVD CARDIAC: RRR, 2/6 systolic murmur loudest at RUSB RESPIRATORY:  Clear to auscultation without rales, wheezing or rhonchi  ABDOMEN: Soft, non-tender, non-distended MUSCULOSKELETAL:  No edema; No deformity  SKIN: Warm and dry NEUROLOGIC:  Alert and oriented x 3 PSYCHIATRIC:  Normal affect   ASSESSMENT:    1. S/P TAVR (transcatheter aortic valve replacement)   2. Coronary artery disease involving native coronary artery of native heart without angina pectoris   3. Essential hypertension   4. Hyperlipidemia, unspecified hyperlipidemia type       PLAN:    Aortic stenosis s/p TAVR 10/20/20: Cath on 08/26/2020 showed severe aortic stenosis with mean gradient 53 mmHg.  Underwent TAVR on 01/25/4402, no complications.  Echocardiogram on 11/25/2020 showed EF 70 to 47%, grade 1 diastolic dysfunction, normal RV function, 20 mm sapient 3 valve with mean gradient 18 mmHg.  No symptoms to suggest valve dysfunction.  Echocardiogram 09/29/2021 showed EF 65 to 42%, grade 1 diastolic dysfunction, normal RV function, mild PVL, MG 16 mmHg.  CAD: Cath on 08/26/2020 showed severe two-vessel CAD (80% proximal RCA, 90% proximal LAD, 80% mid LAD, 50% mid LAD) and severe aortic stenosis with mean gradient 53 mmHg.  Underwent PCI to the RCA on 09/1120, with plan to perform PCI on LAD after TAVR.  Underwent TAVR on 12/28/5636, no complications. Plan was medical management and consider PCI if having symptoms.  Reports no anginal symptoms.  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  Asymptomatic.  Follows with Dr. Donzetta Matters in vascular surgery, monitoring  Hypertension: On candesartan 32 mg daily, amlodipine 7.5 mg daily, Coreg 6.25 mg twice daily.  Elevated in clinic but appears under good control at home  Hyperlipidemia: On rosuvastatin 10 mg daily and Zetia 10 mg daily.  LDL 55 on  05/07/21   RTC in 6 months  Medication Adjustments/Labs and Tests Ordered: Current medicines are reviewed at length with the patient today.  Concerns regarding medicines are outlined above.  Orders Placed This Encounter  Procedures   EKG 12-Lead     No orders of the defined types were placed in this encounter.    Patient Instructions  Medication Instructions:  Your physician recommends that you continue on your current medications as directed. Please refer to the Current Medication list given to you today.  *If you need a refill on your cardiac medications before your next appointment, please call your pharmacy*  Follow-Up: At [facility], you and your health needs are our priority.  As part of our continuing mission to provide you with exceptional heart care, we have created designated Provider Care Teams.  These Care Teams include your primary Cardiologist (physician) and Advanced Practice Providers (APPs -  Physician Assistants and Nurse Practitioners) who all work together to provide you with the care you need, when you need it.  We recommend signing up for the patient portal called "MyChart".  Sign up information is provided on this After Visit Summary.  MyChart is used to connect with patients for Virtual Visits (Telemedicine).  Patients are able to view lab/test results, encounter notes, upcoming appointments, etc.  Non-urgent messages can be sent to your provider as well.   To learn more about what you can do with MyChart, go to [email or web address].    Your next appointment:   07/07/21 @ 3pm  Provider:   Donato Heinz, MD {   Signed, Donato Heinz, MD  12/30/2021 5:53 PM    [place]

## 2022-07-07 ENCOUNTER — Ambulatory Visit: Payer: Medicare Other | Attending: Cardiology | Admitting: Cardiology

## 2022-07-07 ENCOUNTER — Encounter: Payer: Self-pay | Admitting: Cardiology

## 2022-07-07 VITALS — BP 152/70 | HR 69 | Ht 59.0 in | Wt 101.4 lb

## 2022-07-07 DIAGNOSIS — Z952 Presence of prosthetic heart valve: Secondary | ICD-10-CM | POA: Diagnosis not present

## 2022-07-07 DIAGNOSIS — E785 Hyperlipidemia, unspecified: Secondary | ICD-10-CM | POA: Diagnosis not present

## 2022-07-07 DIAGNOSIS — I2510 Atherosclerotic heart disease of native coronary artery without angina pectoris: Secondary | ICD-10-CM | POA: Diagnosis not present

## 2022-07-07 DIAGNOSIS — I10 Essential (primary) hypertension: Secondary | ICD-10-CM | POA: Diagnosis not present

## 2022-07-07 NOTE — Patient Instructions (Signed)
Medication Instructions:  Your physician recommends that you continue on your current medications as directed. Please refer to the Current Medication list given to you today.  *If you need a refill on your cardiac medications before your next appointment, please call your pharmacy*  Follow-Up: At [facility], you and your health needs are our priority.  As part of our continuing mission to provide you with exceptional heart care, we have created designated Provider Care Teams.  These Care Teams include your primary Cardiologist (physician) and Advanced Practice Providers (APPs -  Physician Assistants and Nurse Practitioners) who all work together to provide you with the care you need, when you need it.  We recommend signing up for the patient portal called "MyChart".  Sign up information is provided on this After Visit Summary.  MyChart is used to connect with patients for Virtual Visits (Telemedicine).  Patients are able to view lab/test results, encounter notes, upcoming appointments, etc.  Non-urgent messages can be sent to your provider as well.   To learn more about what you can do with MyChart, go to [email or web address].    Your next appointment:   6 month(s)  The format for your next appointment:   In Person  Provider:   Christopher L Schumann, MD         

## 2022-07-18 NOTE — Telephone Encounter (Signed)
Prolia VOB initiated via [email or web address] 

## 2022-07-19 NOTE — Telephone Encounter (Signed)
Pt ready for scheduling on or after 08/12/22   Out-of-pocket cost due at time of visit: $296   Primary: [facility] Medicare Prolia co-insurance: 20% (approximately $276) Admin fee co-insurance: $20   Secondary: n/a Prolia co-insurance:  Admin fee co-insurance:    Deductible: does not apply   Prior Auth: APPROVED PA# [id number] Valid: 07/19/22-07/20/23   ** This summary of benefits is an estimation of the patient's out-of-pocket cost. Exact cost may vary based on individual plan coverage.

## 2022-09-26 ENCOUNTER — Encounter: Payer: Self-pay | Admitting: Internal Medicine

## 2022-09-28 ENCOUNTER — Ambulatory Visit: Payer: Medicare Other

## 2022-09-28 DIAGNOSIS — C44529 Squamous cell carcinoma of skin of other part of trunk: Secondary | ICD-10-CM | POA: Diagnosis not present

## 2022-10-12 ENCOUNTER — Ambulatory Visit: Payer: Medicare Other

## 2022-10-12 DIAGNOSIS — M810 Age-related osteoporosis without current pathological fracture: Secondary | ICD-10-CM | POA: Diagnosis not present

## 2022-10-12 MED ORDER — DENOSUMAB 60 MG/ML ~~LOC~~ SOSY
60.0000 mg | PREFILLED_SYRINGE | Freq: Once | SUBCUTANEOUS | Status: AC
  Administered 2022-10-12: 60 mg via SUBCUTANEOUS

## 2022-10-12 MED ORDER — DENOSUMAB 60 MG/ML ~~LOC~~ SOSY: 60.0000 mg | PREFILLED_SYRINGE | Freq: Once | SUBCUTANEOUS | 0 refills | Status: DC

## 2022-10-12 NOTE — Progress Notes (Signed)
Prolia given.  Please co-sign.

## 2022-10-18 ENCOUNTER — Other Ambulatory Visit: Payer: Self-pay | Admitting: Cardiology

## 2022-10-19 ENCOUNTER — Other Ambulatory Visit: Payer: Self-pay | Admitting: Cardiology

## 2022-10-31 ENCOUNTER — Other Ambulatory Visit: Payer: Self-pay | Admitting: Cardiology

## 2022-11-01 ENCOUNTER — Telehealth: Payer: Self-pay

## 2022-11-01 NOTE — Telephone Encounter (Signed)
[facility] to schedule their annual wellness visit. Appointment made for 11/02/22.  Norton Blizzard, [place] (AAMA)  [facility] [phone]

## 2022-11-02 ENCOUNTER — Ambulatory Visit (INDEPENDENT_AMBULATORY_CARE_PROVIDER_SITE_OTHER): Payer: Medicare Other

## 2022-11-02 VITALS — Ht 59.0 in

## 2022-11-02 DIAGNOSIS — Z0000 Encounter for general adult medical examination without abnormal findings: Secondary | ICD-10-CM

## 2022-11-02 NOTE — Patient Instructions (Addendum)
Renee Figueroa , Thank you for taking time to come for your Medicare Wellness Visit. I appreciate your ongoing commitment to your health goals. Please review the following plan we discussed and let me know if I can assist you in the future.   These are the goals we discussed:  Goals      Continue to love life, and family. Stay healthy and as independent as possible. Continue to eat healthy and exercise.        This is a list of the screening recommended for you and due dates:  Health Maintenance  Topic Date Due   COVID-19 Vaccine (6 - 2023-24 season) 04/22/2022   DTaP/Tdap/Td vaccine (2 - Td or Tdap) 10/25/2022   Medicare Annual Wellness Visit  11/02/2023   DEXA scan (bone density measurement)  03/29/2024   Pneumonia Vaccine  Completed   Flu Shot  Completed   Zoster (Shingles) Vaccine  Completed   HPV Vaccine  Aged Out    Advanced directives: Yes  Conditions/risks identified: Yes  Next appointment: Follow up in one year for your annual wellness visit.   Preventive Care 9 Years and Older, Female Preventive care refers to lifestyle choices and visits with your health care provider that can promote health and wellness. What does preventive care include? A yearly physical exam. This is also called an annual well check. Dental exams once or twice a year. Routine eye exams. Ask your health care provider how often you should have your eyes checked. Personal lifestyle choices, including: Daily care of your teeth and gums. Regular physical activity. Eating a healthy diet. Avoiding tobacco and drug use. Limiting alcohol use. Practicing safe sex. Taking low-dose aspirin every day. Taking vitamin and mineral supplements as recommended by your health care provider. What happens during an annual well check? The services and screenings done by your health care provider during your annual well check will depend on your age, overall health, lifestyle risk factors, and family history of  disease. Counseling  Your health care provider may ask you questions about your: Alcohol use. Tobacco use. Drug use. Emotional well-being. Home and relationship well-being. Sexual activity. Eating habits. History of falls. Memory and ability to understand (cognition). Work and work Statistician. Reproductive health. Screening  You may have the following tests or measurements: Height, weight, and BMI. Blood pressure. Lipid and cholesterol levels. These may be checked every 5 years, or more frequently if you are over 24 years old. Skin check. Lung cancer screening. You may have this screening every year starting at age 19 if you have a 30-pack-year history of smoking and currently smoke or have quit within the past 15 years. Fecal occult blood test (FOBT) of the stool. You may have this test every year starting at age 35. Flexible sigmoidoscopy or colonoscopy. You may have a sigmoidoscopy every 5 years or a colonoscopy every 10 years starting at age 31. Hepatitis C blood test. Hepatitis B blood test. Sexually transmitted disease (STD) testing. Diabetes screening. This is done by checking your blood sugar (glucose) after you have not eaten for a while (fasting). You may have this done every 1-3 years. Bone density scan. This is done to screen for osteoporosis. You may have this done starting at age 87. Mammogram. This may be done every 1-2 years. Talk to your health care provider about how often you should have regular mammograms. Talk with your health care provider about your test results, treatment options, and if necessary, the need for more tests. Vaccines  Your health care provider may recommend certain vaccines, such as: Influenza vaccine. This is recommended every year. Tetanus, diphtheria, and acellular pertussis (Tdap, Td) vaccine. You may need a Td booster every 10 years. Zoster vaccine. You may need this after age 60. Pneumococcal 13-valent conjugate (PCV13) vaccine. One  dose is recommended after age 3. Pneumococcal polysaccharide (PPSV23) vaccine. One dose is recommended after age 60. Talk to your health care provider about which screenings and vaccines you need and how often you need them. This information is not intended to replace advice given to you by your health care provider. Make sure you discuss any questions you have with your health care provider. Document Released: 09/04/2015 Document Revised: 04/27/2016 Document Reviewed: 06/09/2015 Elsevier Interactive Patient Education  2017 [employer] Prevention in the Home Falls can cause injuries. They can happen to people of all ages. There are many things you can do to make your home safe and to help prevent falls. What can I do on the outside of my home? Regularly fix the edges of walkways and driveways and fix any cracks. Remove anything that might make you trip as you walk through a door, such as a raised step or threshold. Trim any bushes or trees on the path to your home. Use bright outdoor lighting. Clear any walking paths of anything that might make someone trip, such as rocks or tools. Regularly check to see if handrails are loose or broken. Make sure that both sides of any steps have handrails. Any raised decks and porches should have guardrails on the edges. Have any leaves, snow, or ice cleared regularly. Use sand or salt on walking paths during winter. Clean up any spills in your garage right away. This includes oil or grease spills. What can I do in the bathroom? Use night lights. Install grab bars by the toilet and in the tub and shower. Do not use towel bars as grab bars. Use non-skid mats or decals in the tub or shower. If you need to sit down in the shower, use a plastic, non-slip stool. Keep the floor dry. Clean up any water that spills on the floor as soon as it happens. Remove soap buildup in the tub or shower regularly. Attach bath mats securely with double-sided  non-slip rug tape. Do not have throw rugs and other things on the floor that can make you trip. What can I do in the bedroom? Use night lights. Make sure that you have a light by your bed that is easy to reach. Do not use any sheets or blankets that are too big for your bed. They should not hang down onto the floor. Have a firm chair that has side arms. You can use this for support while you get dressed. Do not have throw rugs and other things on the floor that can make you trip. What can I do in the kitchen? Clean up any spills right away. Avoid walking on wet floors. Keep items that you use a lot in easy-to-reach places. If you need to reach something above you, use a strong step stool that has a grab bar. Keep electrical cords out of the way. Do not use floor polish or wax that makes floors slippery. If you must use wax, use non-skid floor wax. Do not have throw rugs and other things on the floor that can make you trip. What can I do with my stairs? Do not leave any items on the stairs. Make sure that there are  handrails on both sides of the stairs and use them. Fix handrails that are broken or loose. Make sure that handrails are as long as the stairways. Check any carpeting to make sure that it is firmly attached to the stairs. Fix any carpet that is loose or worn. Avoid having throw rugs at the top or bottom of the stairs. If you do have throw rugs, attach them to the floor with carpet tape. Make sure that you have a light switch at the top of the stairs and the bottom of the stairs. If you do not have them, ask someone to add them for you. What else can I do to help prevent falls? Wear shoes that: Do not have high heels. Have rubber bottoms. Are comfortable and fit you well. Are closed at the toe. Do not wear sandals. If you use a stepladder: Make sure that it is fully opened. Do not climb a closed stepladder. Make sure that both sides of the stepladder are locked into place. Ask  someone to hold it for you, if possible. Clearly mark and make sure that you can see: Any grab bars or handrails. First and last steps. Where the edge of each step is. Use tools that help you move around (mobility aids) if they are needed. These include: Canes. Walkers. Scooters. Crutches. Turn on the lights when you go into a dark area. Replace any light bulbs as soon as they burn out. Set up your furniture so you have a clear path. Avoid moving your furniture around. If any of your floors are uneven, fix them. If there are any pets around you, be aware of where they are. Review your medicines with your doctor. Some medicines can make you feel dizzy. This can increase your chance of falling. Ask your doctor what other things that you can do to help prevent falls. This information is not intended to replace advice given to you by your health care provider. Make sure you discuss any questions you have with your health care provider. Document Released: 06/04/2009 Document Revised: 01/14/2016 Document Reviewed: 09/12/2014 Elsevier Interactive Patient Education  2017 [employer].

## 2022-11-02 NOTE — Progress Notes (Addendum)
I connected with  Renee Figueroa on 11/02/22 by a audio enabled telemedicine application and verified that I am speaking with the correct person using two identifiers.  Patient Location: Home  Provider Location: Home Office  I discussed the limitations of evaluation and management by telemedicine. The patient expressed understanding and agreed to proceed.  Patient Medicare AWV questionnaire was completed by the patient on 11/01/2022; I have confirmed that all information answered by patient is correct and no changes since this date.    Subjective:   Renee Figueroa is a 89 y.o. female who presents for Medicare Annual (Subsequent) preventive examination.  Review of Systems     Cardiac Risk Factors include: advanced age (>56mn, >>81women);hypertension;dyslipidemia;family history of premature cardiovascular disease     Objective:    Today's Vitals   11/02/22 1507  Height: '4\' 11"'$  (1.499 m)  PainSc: 0-No pain   Body mass index is 20.48 kg/m.     11/02/2022    3:15 PM 10/16/2020   10:54 AM 10/16/2020    9:45 AM 10/02/2020    8:55 AM 08/26/2020   11:24 AM 02/14/2018    1:38 PM 10/02/2016    7:48 PM  Advanced Directives  Does Patient Have a Medical Advance Directive? Yes Yes No Yes No Yes No  Type of AParamedicof [place] will [place] will  [place]Living will  [place] will   Does patient want to make changes to medical advance directive?   No - Patient declined No - Patient declined     Copy of HSanta Isabelin Chart? No - copy requested   No - copy requested  No - copy requested   Would patient like information on creating a medical advance directive?   No - Patient declined No - Patient declined No - Patient declined  No - Patient declined    Current Medications (verified) Outpatient Encounter Medications as of 11/02/2022  Medication Sig   acetaminophen  (TYLENOL) 500 MG tablet Take 1,000 mg by mouth every 6 (six) hours as needed for moderate pain.   amLODipine (NORVASC) 5 MG tablet TAKE 1 AND 1/2 TABLETS BY MOUTH DAILY   amoxicillin (AMOXIL) 500 MG tablet Take 4 tablets by mouth 1 hour  prior to dental procedures and cleanings   aspirin EC 81 MG tablet Take 81 mg by mouth daily. Swallow whole.   candesartan (ATACAND) 32 MG tablet TAKE 1 TABLET(32 MG) BY MOUTH DAILY   carboxymethylcellulose (REFRESH PLUS) 0.5 % SOLN Place 1 drop into both eyes 3 (three) times daily as needed (dry eyes).   carvedilol (COREG) 6.25 MG tablet TAKE 1 TABLET(6.25 MG) BY MOUTH TWICE DAILY   ezetimibe (ZETIA) 10 MG tablet TAKE 1 TABLET(10 MG) BY MOUTH DAILY   fluticasone (FLONASE) 50 MCG/ACT nasal spray Place 1 spray into both nostrils daily as needed for allergies or rhinitis.   Multiple Minerals-Vitamins (CALCIUM & VIT D3 BONE HEALTH PO) Take 2 each by mouth daily. Chewables   Multiple Vitamins-Minerals (MULTIVITAMIN WITH MINERALS) tablet Take 2 tablets by mouth daily. Chewables   rosuvastatin (CRESTOR) 10 MG tablet TAKE 1 TABLET(10 MG) BY MOUTH DAILY   No facility-administered encounter medications on file as of 11/02/2022.    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 Neg 2013   CORONARY ATHERECTOMY   10/02/2020   CORONARY STENT INTERVENTION N/A 10/02/2020   Procedure: CORONARY STENT INTERVENTION;  Surgeon: Sherren Mocha, MD;  Location: [facility];  Service: Cardiovascular;  Laterality: N/A;   DILATION AND CURETTAGE OF UTERUS     age 84   EYE SURGERY     G 2 P 2     INTRAVASCULAR ULTRASOUND/IVUS N/A 10/02/2020   Procedure: Intravascular Ultrasound/IVUS;  Surgeon: Sherren Mocha, MD;  Location: [facility];  Service: Cardiovascular;  Laterality: N/A;   RIGHT/LEFT HEART CATH AND CORONARY ANGIOGRAPHY N/A 08/26/2020   Procedure: RIGHT/LEFT HEART CATH AND CORONARY ANGIOGRAPHY;  Surgeon: Sherren Mocha, MD;  Location: [facility];  Service: Cardiovascular;  Laterality: N/A;   TEE WITHOUT CARDIOVERSION N/A 10/20/2020   Procedure: TRANSESOPHAGEAL ECHOCARDIOGRAM (TEE);  Surgeon: Sherren Mocha, MD;  Location: [facility];  Service: Open Heart Surgery;  Laterality: N/A;   TONSILLECTOMY AND ADENOIDECTOMY     TRANSCATHETER AORTIC VALVE REPLACEMENT, TRANSFEMORAL N/A 10/20/2020   Procedure: TRANSCATHETER AORTIC VALVE REPLACEMENT, TRANSFEMORAL;  Surgeon: Sherren Mocha, MD;  Location: [facility];  Service: Open Heart Surgery;  Laterality: N/A;   ULTRASOUND GUIDANCE FOR VASCULAR ACCESS Bilateral 10/20/2020   Procedure: ULTRASOUND GUIDANCE FOR VASCULAR ACCESS;  Surgeon: Sherren Mocha, MD;  Location: [facility];  Service: Open Heart Surgery;  Laterality: Bilateral;   Family History  Problem Relation Age of Onset   Hypertension Mother    Stomach cancer Mother        died @ 39   Heart attack Father 33   Colon cancer Neg Hx    Colon polyps Neg Hx    Rectal cancer Neg Hx    Diabetes Neg Hx    Stroke Neg Hx    Social History   Socioeconomic History   Marital status: Married    Spouse name: Not on file   Number of children: 2   Years of education: Not on file   Highest education level: Not on file  Occupational History   Not on file  Tobacco Use   Smoking status: Never   Smokeless tobacco:  Never  Vaping Use   Vaping Use: Never used  Substance and Sexual Activity   Alcohol use: Yes    Comment: maybe 1 drink a month   Drug use: No   Sexual activity: Not Currently  Other Topics Concern   Not on file  Social History Narrative   Not on file   Social Determinants of Health   Financial Resource Strain: Low Risk  (11/02/2022)   Overall Financial Resource Strain (CARDIA)    Difficulty of Paying Living Expenses: Not very hard  Food Insecurity: No Food Insecurity (11/02/2022)   Hunger Vital Sign    Worried About Running  Out of Food in the Last Year: Never true    Ran Out of Food in the Last Year: Never true  Transportation Needs: No Transportation Needs (11/02/2022)   PRAPARE - Hydrologist (Medical): No    Lack of Transportation (Non-Medical): No  Physical Activity: Insufficiently Active (11/02/2022)   Exercise Vital Sign    Days of Exercise per Week: 2 days    Minutes of Exercise per Session: 20 min  Stress: No Stress Concern Present (11/02/2022)   [place]    Feeling of Stress : Only a little  Social Connections: Socially Integrated (11/02/2022)   Social Connection and Isolation Panel [NHANES]    Frequency of Communication with Friends and Family: More than three times a week    Frequency of Social Gatherings with Friends and Family: More than three times a week    Attends Religious Services: More than 4 times per year    Active Member of [employer] or Organizations: Yes    Attends Music therapist: More than 4 times per year    Marital Status: Married    Tobacco Counseling Counseling given: Not Answered   Clinical Intake:  Pre-visit preparation completed: Yes  Pain : No/denies pain Pain Score: 0-No pain     Nutritional Risks: None Diabetes: No  How often do you need to have someone help you when you read instructions, pamphlets, or other written  materials from your doctor or pharmacy?: 2 - Rarely What is the last grade level you completed in school?: HSG  Diabetic? No  Interpreter Needed?: No  Information entered by :: Lisette Abu, LPN.   Activities of Daily Living    11/02/2022    3:13 PM 11/01/2022    3:30 PM  In your present state of health, do you have any difficulty performing the following activities:  Hearing? 0 0  Vision? 0 0  Difficulty concentrating or making decisions? 0 0  Walking or climbing stairs? 0 0  Dressing or bathing? 0 0  Doing errands, shopping? 0 0  Preparing Food and eating ? N N  Using the Toilet? N N  In the past six months, have you accidently leaked urine? Y Y  Do you have problems with loss of bowel control? N N  Managing your Medications? N N  Managing your Finances? N N  Housekeeping or managing your Housekeeping? N N    Patient Care Team: Binnie Rail, MD as PCP - General (Internal Medicine) Donato Heinz, MD as PCP - Cardiology (Cardiology) Alden Hipp, MD as Consulting Physician (Obstetrics and Gynecology)  Indicate any recent Medical Services you may have received from other than [facility] providers in the past year (date may be approximate).     Assessment:   This is a routine wellness examination for Renee Figueroa.  Hearing/Vision screen Hearing Screening - Comments:: Patient has hearing difficulty and wears hearing aids. Vision Screening - Comments:: Patient does wear corrective lenses/contacts.  Eye exam done by: Katy Apo, MD.   Dietary issues and exercise activities discussed: Current Exercise Habits: Home exercise routine, Type of exercise: walking, Time (Minutes): 20, Frequency (Times/Week): 2, Weekly Exercise (Minutes/Week): 40, Intensity: Moderate, Exercise limited by: None identified   Goals Addressed             This Visit's Progress    Continue to love life, and family. Stay healthy and as independent as possible. Continue to eat healthy and  exercise.        Depression Screen    11/02/2022    3:10 PM 05/09/2022   10:39 AM 05/07/2021    1:14 PM 05/04/2020    9:50 AM 05/02/2019    9:02 AM 02/14/2018    2:44 PM 10/24/2012   10:46 AM  PHQ 2/9 Scores  PHQ - 2 Score 0 0 0 0 0 0 0  PHQ- 9 Score  0         Fall Risk    11/02/2022    3:13 PM 11/01/2022    3:30 PM 05/09/2022   10:40 AM 05/09/2022   10:39 AM 05/07/2021    1:14 PM  [place] in the past year? 0 0 0 0 0  Number falls in past yr: 0 0 0 0 0  Injury with Fall? 0  0 0 0  Risk for fall due to : No Fall Risks  No Fall Risks No Fall Risks No Fall Risks  Follow up Falls prevention discussed  Falls evaluation completed Falls evaluation completed Falls evaluation completed    [place]:  Any stairs in or around the home? Yes  If so, are there any without handrails? No  Home free of loose throw rugs in walkways, pet beds, electrical cords, etc? Yes  Adequate lighting in your home to reduce risk of falls? Yes   ASSISTIVE DEVICES UTILIZED TO PREVENT FALLS:  Life alert? No  Use of a cane, walker or w/c? No  Grab bars in the bathroom? Yes  Shower chair or bench in shower? Yes  Elevated toilet seat or a handicapped toilet? Yes   TIMED UP AND GO:  Was the test performed? No . Telephonic Visit  Cognitive Function:    02/14/2018    1:39 PM  MMSE - Mini Mental State Exam  Orientation to time 5  Orientation to Place 5  Registration 3  Attention/ Calculation 5  Recall 2  Language- name 2 objects 2  Language- repeat 1  Language- follow 3 step command 3  Language- read & follow direction 1  Write a sentence 1  Copy design 1  Total score 29        11/02/2022    3:15 PM  6CIT Screen  What Year? 0 points  What month? 0 points  What time? 0 points  Count back from 20 0 points  Months in reverse 0 points  Repeat phrase 0 points  Total Score 0 points    Immunizations Immunization History  Administered Date(s)  Administered   Fluad Quad(high Dose 65+) 05/02/2019, 05/04/2020, 05/07/2021, 05/09/2022   H1N1 08/27/2008   Influenza Split 06/09/2011, 06/07/2012   Influenza Whole 05/26/2009, 05/19/2010   Influenza, High Dose Seasonal PF 05/24/2016, 05/24/2018   Influenza,inj,Quad PF,6+ Mos 06/11/2013, 05/21/2014, 05/22/2015   Influenza-Unspecified 05/02/2017, 05/09/2022   PFIZER(Purple Top)SARS-COV-2 Vaccination 08/31/2019, 09/21/2019, 04/24/2020, 04/28/2021   Pfizer Covid-19 Vaccine Bivalent Booster 12yr & up 12/17/2021   Pneumococcal Conjugate-13 07/15/2015   Pneumococcal Polysaccharide-23 06/22/2002   Tdap 10/24/2012   Zoster Recombinat (Shingrix) 12/16/2021, 02/17/2022   Zoster, Live 11/06/2012    TDAP status: Due, Education has been provided regarding the importance of this vaccine. Advised may receive this vaccine at local pharmacy or Health Dept. Aware to provide a copy of the vaccination record if obtained from local pharmacy or Health Dept. Verbalized acceptance and understanding.  Flu Vaccine status: Up to date  Pneumococcal vaccine status: Up to date  Covid-19 vaccine status: Completed vaccines  Qualifies for Shingles Vaccine? Yes   Zostavax completed Yes   Shingrix Completed?: Yes  Screening Tests Health Maintenance  Topic Date Due   COVID-19 Vaccine (6 - 2023-24 season) 04/22/2022   DTaP/Tdap/Td (2 - Td or Tdap) 10/25/2022   Medicare Annual Wellness (AWV)  11/02/2023   DEXA SCAN  03/29/2024   Pneumonia Vaccine 67+ Years old  Completed   INFLUENZA VACCINE  Completed   Zoster Vaccines- Shingrix  Completed   HPV VACCINES  Aged Out    Health Maintenance  Health Maintenance Due  Topic Date Due   COVID-19 Vaccine (6 - 2023-24 season) 04/22/2022   DTaP/Tdap/Td (2 - Td or Tdap) 10/25/2022    Colorectal cancer screening: No longer required.   Mammogram status: No longer required due to age.  Bone Density status: Completed 03/29/2022. Results reflect: Bone density results:  OSTEOPOROSIS. Repeat every 2 years.  Lung Cancer Screening: (Low Dose CT Chest recommended if Age 2-80 years, 30 pack-year currently smoking OR have quit w/in 15years.) does not qualify.   Lung Cancer Screening Referral: no  Additional Screening:  Hepatitis C Screening: does not qualify; Completed no  Vision Screening: Recommended annual ophthalmology exams for early detection of glaucoma and other disorders of the eye. Is the patient up to date with their annual eye exam?  Yes  Who is the provider or what is the name of the office in which the patient attends annual eye exams? Katy Apo, MD. If pt is not established with a provider, would they like to be referred to a provider to establish care? No .   Dental Screening: Recommended annual dental exams for proper oral hygiene  Community Resource Referral / Chronic Care Management: CRR required this visit?  No   CCM required this visit?  No      Plan:     I have personally reviewed and noted the following in the patient's chart:   Medical and social history Use of alcohol, tobacco or illicit drugs  Current medications and supplements including opioid prescriptions. Patient is not currently taking opioid prescriptions. Functional ability and status Nutritional status Physical activity Advanced directives List of other physicians Hospitalizations, surgeries, and ER visits in previous 12 months Vitals Screenings to include cognitive, depression, and falls Referrals and appointments  In addition, I have reviewed and discussed with patient certain preventive protocols, quality metrics, and best practice recommendations. A written personalized care plan for preventive services as well as general preventive health recommendations were provided to patient.     Sheral Flow, LPN   [id number]   Nurse Notes:  Normal cognitive status assessed by direct observation by this Nurse Health Advisor. No abnormalities found.

## 2022-11-03 ENCOUNTER — Other Ambulatory Visit: Payer: Self-pay | Admitting: Cardiology

## 2022-11-24 ENCOUNTER — Other Ambulatory Visit: Payer: Self-pay | Admitting: Cardiology

## 2022-12-01 DIAGNOSIS — Z961 Presence of intraocular lens: Secondary | ICD-10-CM | POA: Diagnosis not present

## 2022-12-01 DIAGNOSIS — H52203 Unspecified astigmatism, bilateral: Secondary | ICD-10-CM | POA: Diagnosis not present

## 2022-12-01 DIAGNOSIS — H40053 Ocular hypertension, bilateral: Secondary | ICD-10-CM | POA: Diagnosis not present

## 2023-01-19 ENCOUNTER — Encounter: Payer: Self-pay | Admitting: Cardiology

## 2023-01-19 ENCOUNTER — Other Ambulatory Visit: Payer: Self-pay | Admitting: Physician Assistant

## 2023-01-19 ENCOUNTER — Ambulatory Visit: Payer: Medicare Other | Attending: Cardiology | Admitting: Cardiology

## 2023-01-19 VITALS — BP 186/74 | HR 69 | Ht 59.0 in | Wt 99.8 lb

## 2023-01-19 DIAGNOSIS — E782 Mixed hyperlipidemia: Secondary | ICD-10-CM

## 2023-01-19 DIAGNOSIS — E785 Hyperlipidemia, unspecified: Secondary | ICD-10-CM | POA: Diagnosis not present

## 2023-01-19 DIAGNOSIS — I2510 Atherosclerotic heart disease of native coronary artery without angina pectoris: Secondary | ICD-10-CM | POA: Diagnosis not present

## 2023-01-19 DIAGNOSIS — Z952 Presence of prosthetic heart valve: Secondary | ICD-10-CM

## 2023-01-19 DIAGNOSIS — I10 Essential (primary) hypertension: Secondary | ICD-10-CM

## 2023-01-19 DIAGNOSIS — I719 Aortic aneurysm of unspecified site, without rupture: Secondary | ICD-10-CM | POA: Diagnosis not present

## 2023-01-19 NOTE — Patient Instructions (Signed)
Medication Instructions:  Your physician recommends that you continue on your current medications as directed. Please refer to the Current Medication list given to you today.   *If you need a refill on your cardiac medications before your next appointment, please call your pharmacy*  Follow-Up: At [facility], you and your health needs are our priority.  As part of our continuing mission to provide you with exceptional heart care, we have created designated Provider Care Teams.  These Care Teams include your primary Cardiologist (physician) and Advanced Practice Providers (APPs -  Physician Assistants and Nurse Practitioners) who all work together to provide you with the care you need, when you need it.  We recommend signing up for the patient portal called "MyChart".  Sign up information is provided on this After Visit Summary.  MyChart is used to connect with patients for Virtual Visits (Telemedicine).  Patients are able to view lab/test results, encounter notes, upcoming appointments, etc.  Non-urgent messages can be sent to your provider as well.   To learn more about what you can do with MyChart, go to [email or web address].    Your next appointment:   6 month(s)  Provider:   Little Ishikawa, MD     Other Instructions Please check your blood pressure at home twice daily, write it down.  Call the office or send message via Mychart with the readings in 1 week for Dr. Bjorn Pippin to review.

## 2023-01-19 NOTE — Progress Notes (Signed)
Cardiology Office Note:    Date:  01/19/2023   ID:  [name], DOB Jul 16, 1934, MRN [id number]  PCP:  Pincus Sanes, MD  Cardiologist:  Little Ishikawa, MD  Electrophysiologist:  None   Referring MD: Pincus Sanes, MD   No chief complaint on file.    History of Present Illness:    Renee Figueroa is a 89 y.o. female with a hx of hypertension, hyperlipidemia who presents for follow-up.  She was referred by Dr. Lawerance Bach for evaluation of aortic stenosis, initially seen on 06/10/2020.  She denies any chest pain, shortness of breath, lightheadedness, or syncope.  Denies any palpitations or lower extremity edema.  Reports she does not exercise regularly.  Most exertion she does is walking around her house or walking the dog.  Reports BP is usually under good control at home, typically 120s.  No smoking history.  Father died of MI at age 66.  Echocardiogram on 05/20/2020 showed EF 70 to 75%, grade 1 diastolic dysfunction, severe mid cavitary obstruction (peak gradient 90 mmHg), normal RV function, severe aortic stenosis (mean gradient 40 mmHg).  Alternate imaging recommended to better assess aortic stenosis versus high flow state.  CTA was ordered, with aortic valve calcium score 1647, consistent with severe AS.  Coronary calcium score 476.  Proximal to mid LAD heavily calcified, unable to rule out obstructive CAD.  Cath on 08/26/2020 showed severe two-vessel CAD (80% proximal RCA, 90% proximal LAD, 80% mid LAD, 50% mid LAD) and severe aortic stenosis with mean gradient 53 mmHg.  Underwent PCI to the RCA on 09/1120, with plan to perform PCI on LAD after TAVR.  Underwent TAVR on 10/20/2020, no complications.  Echocardiogram on 11/25/2020 showed EF 70 to 75%, grade 1 diastolic dysfunction, normal RV function, 20 mm TAVR valve with mean gradient 18 mmHg.  Decision was made to hold off on PCI LAD unless having symptoms, just manage medically for now.  Echocardiogram 09/29/2021 showed EF 65 to 70%, grade 1  diastolic dysfunction, normal RV function, mild PVL, MG 16 mmHg.  Since last clinic visit, she reports she is doing well.  States that she ran out of her amlodipine, last dose was this morning.  BP had been in 120s when checks at home.  Denies any chest pain, dyspnea, lightheadedness, syncope, lower extremity edema, or palpitations.   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 Neg 2013   CORONARY ATHERECTOMY  10/02/2020   CORONARY STENT INTERVENTION N/A 10/02/2020   Procedure: CORONARY STENT INTERVENTION;  Surgeon: Tonny Bollman, MD;  Location: [facility] INVASIVE CV LAB;  Service: Cardiovascular;  Laterality: N/A;   CORONARY ULTRASOUND/IVUS N/A 10/02/2020  Procedure: Intravascular Ultrasound/IVUS;  Surgeon: Tonny Bollman, MD;  Location: [facility] INVASIVE CV LAB;  Service: Cardiovascular;  Laterality: N/A;   DILATION AND CURETTAGE OF UTERUS     age 67   EYE SURGERY     G 2 P 2     RIGHT/LEFT HEART CATH AND CORONARY ANGIOGRAPHY N/A 08/26/2020   Procedure: RIGHT/LEFT HEART CATH AND CORONARY ANGIOGRAPHY;  Surgeon: Tonny Bollman, MD;  Location: [facility] INVASIVE CV LAB;  Service: Cardiovascular;  Laterality: N/A;   TEE WITHOUT CARDIOVERSION N/A 10/20/2020   Procedure: TRANSESOPHAGEAL ECHOCARDIOGRAM (TEE);  Surgeon: Tonny Bollman, MD;  Location: [facility] OR;  Service: Open Heart Surgery;  Laterality: N/A;    TONSILLECTOMY AND ADENOIDECTOMY     TRANSCATHETER AORTIC VALVE REPLACEMENT, TRANSFEMORAL N/A 10/20/2020   Procedure: TRANSCATHETER AORTIC VALVE REPLACEMENT, TRANSFEMORAL;  Surgeon: Tonny Bollman, MD;  Location: [facility] OR;  Service: Open Heart Surgery;  Laterality: N/A;   ULTRASOUND GUIDANCE FOR VASCULAR ACCESS Bilateral 10/20/2020   Procedure: ULTRASOUND GUIDANCE FOR VASCULAR ACCESS;  Surgeon: Tonny Bollman, MD;  Location: [facility] OR;  Service: Open Heart Surgery;  Laterality: Bilateral;    Current Medications: Current Meds  Medication Sig   acetaminophen (TYLENOL) 500 MG tablet Take 1,000 mg by mouth every 6 (six) hours as needed for moderate pain.   amLODipine (NORVASC) 5 MG tablet TAKE 1 AND 1/2 TABLETS BY MOUTH DAILY   amoxicillin (AMOXIL) 500 MG tablet Take 4 tablets by mouth 1 hour  prior to dental procedures and cleanings   aspirin EC 81 MG tablet Take 81 mg by mouth daily. Swallow whole.   candesartan (ATACAND) 32 MG tablet TAKE 1 TABLET(32 MG) BY MOUTH DAILY   carboxymethylcellulose (REFRESH PLUS) 0.5 % SOLN Place 1 drop into both eyes 3 (three) times daily as needed (dry eyes).   carvedilol (COREG) 6.25 MG tablet TAKE 1 TABLET(6.25 MG) BY MOUTH TWICE DAILY   ezetimibe (ZETIA) 10 MG tablet TAKE 1 TABLET(10 MG) BY MOUTH DAILY   fluticasone (FLONASE) 50 MCG/ACT nasal spray Place 1 spray into both nostrils daily as needed for allergies or rhinitis.   Multiple Minerals-Vitamins (CALCIUM & VIT D3 BONE HEALTH PO) Take 2 each by mouth daily. Chewables   Multiple Vitamins-Minerals (MULTIVITAMIN WITH MINERALS) tablet Take 2 tablets by mouth daily. Chewables   rosuvastatin (CRESTOR) 10 MG tablet TAKE 1 TABLET(10 MG) BY MOUTH DAILY     Allergies:   Patient has no active allergies.   Social History   Socioeconomic History   Marital status: Married    Spouse name: Not on file   Number of children: 2   Years of education: Not on file   Highest education level: Not on file  Occupational History    Not on file  Tobacco Use   Smoking status: Never   Smokeless tobacco: Never  Vaping Use   Vaping Use: Never used  Substance and Sexual Activity   Alcohol use: Yes    Comment: maybe 1 drink a month   Drug use: No   Sexual activity: Not Currently  Other Topics Concern   Not on file  Social History Narrative   Not on file   Social Determinants of Health   Financial Resource Strain: Low Risk  (11/02/2022)   Overall Financial Resource Strain (CARDIA)    Difficulty of Paying Living Expenses: Not very hard  Food Insecurity: No Food Insecurity (11/02/2022)   Hunger Vital Sign    Worried About Running Out of Food in the Last Year: Never true  Ran Out of Food in the Last Year: Never true  Transportation Needs: No Transportation Needs (11/02/2022)   PRAPARE - Administrator, Civil Service (Medical): No    Lack of Transportation (Non-Medical): No  Physical Activity: Insufficiently Active (11/02/2022)   Exercise Vital Sign    Days of Exercise per Week: 2 days    Minutes of Exercise per Session: 20 min  Stress: No Stress Concern Present (11/02/2022)   [employer] of Occupational Health - Occupational Stress Questionnaire    Feeling of Stress : Only a little  Social Connections: Socially Integrated (11/02/2022)   Social Connection and Isolation Panel [NHANES]    Frequency of Communication with Friends and Family: More than three times a week    Frequency of Social Gatherings with Friends and Family: More than three times a week    Attends Religious Services: More than 4 times per year    Active Member of [employer] or Organizations: Yes    Attends Engineer, structural: More than 4 times per year    Marital Status: Married     Family History: The patient's family history includes Heart attack (age of onset: 33) in her father; Hypertension in her mother; Stomach cancer in her mother. There is no history of Colon cancer, Colon polyps, Rectal cancer, Diabetes, or  Stroke.  ROS:   Please see the history of present illness.     All other systems reviewed and are negative.  EKGs/Labs/Other Studies Reviewed:    The following studies were reviewed today:   EKG:   12/30/2021: Normal sinus rhythm, rate 62, no ST abnormalities 01/19/2023: Normal sinus rhythm with PACs, no ST abnormalities, poor R wave progression  Recent Labs: 05/09/2022: ALT 16; BUN 17; Creatinine, Ser 0.73; Hemoglobin 13.7; Platelets 220.0; Potassium 4.1; Sodium 139; TSH 1.73  Recent Lipid Panel    Component Value Date/Time   CHOL 141 05/09/2022 1116   CHOL 142 12/30/2020 0832   TRIG 112.0 05/09/2022 1116   HDL 63.60 05/09/2022 1116   HDL 65 12/30/2020 0832   CHOLHDL 2 05/09/2022 1116   VLDL 22.4 05/09/2022 1116   LDLCALC 55 05/09/2022 1116   LDLCALC 64 12/30/2020 0832   LDLCALC 164 (H) 05/04/2020 1007   LDLDIRECT 156.8 10/24/2012 1207    Physical Exam:    VS:  BP (!) 186/74 (BP Location: Left Arm, Patient Position: Sitting, Cuff Size: Normal)   Pulse 69   Ht 4\' 11"  (1.499 m)   Wt 99 lb 12.8 oz (45.3 kg)   SpO2 98%   BMI 20.16 kg/m     Wt Readings from Last 3 Encounters:  01/19/23 99 lb 12.8 oz (45.3 kg)  07/07/22 101 lb 6.4 oz (46 kg)  05/09/22 101 lb 3.2 oz (45.9 kg)     GEN:  in no acute distress HEENT: Normal NECK: No JVD CARDIAC: RRR, 2/6 systolic murmur loudest at RUSB RESPIRATORY:  Clear to auscultation without rales, wheezing or rhonchi  ABDOMEN: Soft, non-tender, non-distended MUSCULOSKELETAL:  No edema; No deformity  SKIN: Warm and dry NEUROLOGIC:  Alert and oriented x 3 PSYCHIATRIC:  Normal affect   ASSESSMENT:    1. Coronary artery disease involving native coronary artery of native heart without angina pectoris   2. S/P TAVR (transcatheter aortic valve replacement)   3. Essential hypertension   4. Penetrating ulcer of aorta (HCC)   5. Mixed hyperlipidemia   6. Hyperlipidemia, unspecified hyperlipidemia type       PLAN:  Aortic  stenosis s/p TAVR 10/20/20: Cath on 08/26/2020 showed severe aortic stenosis with mean gradient 53 mmHg.  Underwent TAVR on 10/20/2020, no complications.  Echocardiogram on 11/25/2020 showed EF 70 to 75%, grade 1 diastolic dysfunction, normal RV function, 20 mm sapient 3 valve with mean gradient 18 mmHg.  No symptoms to suggest valve dysfunction.  Echocardiogram 09/29/2021 showed EF 65 to 70%, grade 1 diastolic dysfunction, normal RV function, mild PVL, MG 16 mmHg.  CAD: Cath on 08/26/2020 showed severe two-vessel CAD (80% proximal RCA, 90% proximal LAD, 80% mid LAD, 50% mid LAD) and severe aortic stenosis with mean gradient 53 mmHg.  Underwent PCI to the RCA on 09/1120, with plan to perform PCI on LAD after TAVR.  Underwent TAVR on 10/20/2020, no complications. Plan was medical management and consider PCI if having symptoms.  Reports no anginal symptoms.  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  Asymptomatic.  Follows with Dr. Randie Heinz in vascular surgery, monitoring  Hypertension: On candesartan 32 mg daily, amlodipine 7.5 mg daily, Coreg 6.25 mg twice daily.  BP elevated in clinic today but reports she ran out of her amlodipine, and BP had been well-controlled when taking.  Will refill her amlodipine and asked to check BP twice daily for next week and let us know results  Hyperlipidemia: On rosuvastatin 10 mg daily and Zetia 10 mg daily.  LDL 55 on 05/09/22   RTC in 6 months   Medication Adjustments/Labs and Tests Ordered: Current medicines are reviewed at length with the patient today.  Concerns regarding medicines are outlined above.  Orders Placed This Encounter  Procedures   EKG 12-Lead     No orders of the defined types were placed in this encounter.    Patient Instructions  Medication Instructions:  Your physician recommends that you continue on your current medications as directed. Please refer to the Current  Medication list given to you today.   *If you need a refill on your cardiac medications before your next appointment, please call your pharmacy*  Follow-Up: At [facility], you and your health needs are our priority.  As part of our continuing mission to provide you with exceptional heart care, we have created designated Provider Care Teams.  These Care Teams include your primary Cardiologist (physician) and Advanced Practice Providers (APPs -  Physician Assistants and Nurse Practitioners) who all work together to provide you with the care you need, when you need it.  We recommend signing up for the patient portal called "MyChart".  Sign up information is provided on this After Visit Summary.  MyChart is used to connect with patients for Virtual Visits (Telemedicine).  Patients are able to view lab/test results, encounter notes, upcoming appointments, etc.  Non-urgent messages can be sent to your provider as well.   To learn more about what you can do with MyChart, go to [email or web address].    Your next appointment:   6 month(s)  Provider:   Little Ishikawa, MD     Other Instructions Please check your blood pressure at home twice daily, write it down.  Call the office or send message via Mychart with the readings in 1 week for Dr. Bjorn Pippin to review.      Signed, Little Ishikawa, MD  01/19/2023 2:09 PM    [facility]

## 2023-01-26 ENCOUNTER — Encounter: Payer: Self-pay | Admitting: Cardiology

## 2023-03-23 ENCOUNTER — Other Ambulatory Visit: Payer: Self-pay | Admitting: *Deleted

## 2023-03-23 DIAGNOSIS — I714 Abdominal aortic aneurysm, without rupture, unspecified: Secondary | ICD-10-CM

## 2023-04-06 ENCOUNTER — Encounter (INDEPENDENT_AMBULATORY_CARE_PROVIDER_SITE_OTHER): Payer: Self-pay

## 2023-04-19 ENCOUNTER — Ambulatory Visit: Payer: Medicare Other | Admitting: Physician Assistant

## 2023-04-19 ENCOUNTER — Ambulatory Visit (HOSPITAL_COMMUNITY)
Admission: RE | Admit: 2023-04-19 | Discharge: 2023-04-19 | Disposition: A | Discharge status: Home / Self Care | Payer: Medicare Other | Source: Ambulatory Visit | Attending: Vascular Surgery | Admitting: Vascular Surgery

## 2023-04-19 VITALS — BP 161/80 | HR 75 | Temp 98.4°F | Wt 100.0 lb

## 2023-04-19 DIAGNOSIS — I714 Abdominal aortic aneurysm, without rupture, unspecified: Secondary | ICD-10-CM

## 2023-04-19 NOTE — Progress Notes (Signed)
[facility] [place] HISTORY AND PHYSICAL   History of Present Illness:  Patient is a 89 y.o. year old female who presents for evaluation of abdominal aortic abnormality ulcerated plaque seen in the mid aorta.  During her pre op work up for TAVR.  She was last seen by Dr. Randie Heinz  she was asymptomatic for abdominal pain or back pain.  She stays active and is pleased with her activity.    She just had her 3 erd [place] grandchild and is excited to visit her family in [place].         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 Neg 2013   CORONARY ATHERECTOMY  10/02/2020   CORONARY STENT INTERVENTION N/A 10/02/2020   Procedure: CORONARY STENT INTERVENTION;  Surgeon: Tonny Bollman, MD;  Location: [facility] INVASIVE CV LAB;  Service: Cardiovascular;  Laterality: N/A;   CORONARY ULTRASOUND/IVUS N/A 10/02/2020   Procedure: Intravascular Ultrasound/IVUS;  Surgeon: Tonny Bollman, MD;  Location: [facility] INVASIVE CV LAB;  Service: Cardiovascular;  Laterality: N/A;   DILATION AND CURETTAGE OF UTERUS     age 49   EYE SURGERY     G 2 P 2     RIGHT/LEFT HEART CATH AND CORONARY ANGIOGRAPHY N/A 08/26/2020   Procedure: RIGHT/LEFT HEART CATH AND CORONARY ANGIOGRAPHY;  Surgeon: Tonny Bollman, MD;  Location: [facility] INVASIVE CV LAB;  Service: Cardiovascular;  Laterality: N/A;   TEE WITHOUT CARDIOVERSION N/A  10/20/2020   Procedure: TRANSESOPHAGEAL ECHOCARDIOGRAM (TEE);  Surgeon: Tonny Bollman, MD;  Location: [facility] OR;  Service: Open Heart Surgery;  Laterality: N/A;   TONSILLECTOMY AND ADENOIDECTOMY     TRANSCATHETER AORTIC VALVE REPLACEMENT, TRANSFEMORAL N/A 10/20/2020   Procedure: TRANSCATHETER AORTIC VALVE REPLACEMENT, TRANSFEMORAL;  Surgeon: Tonny Bollman, MD;  Location: [facility] OR;  Service: Open Heart Surgery;  Laterality: N/A;   ULTRASOUND GUIDANCE FOR VASCULAR ACCESS Bilateral 10/20/2020   Procedure: ULTRASOUND GUIDANCE FOR VASCULAR ACCESS;  Surgeon: Tonny Bollman, MD;  Location: [facility] OR;  Service: Open Heart Surgery;  Laterality: Bilateral;     Social History Social History   Tobacco Use   Smoking status: Never   Smokeless tobacco: Never  Vaping Use   Vaping status: Never Used  Substance Use Topics   Alcohol use: Yes    Comment: maybe 1 drink a month   Drug use: No    Family History Family History  Problem Relation Age of Onset   Hypertension Mother    Stomach cancer Mother        died @ 74   Heart attack Father 76   Colon cancer Neg Hx    Colon polyps Neg Hx    Rectal  cancer Neg Hx    Diabetes Neg Hx    Stroke Neg Hx     Allergies  No Active Allergies   Current Outpatient Medications  Medication Sig Dispense Refill   acetaminophen (TYLENOL) 500 MG tablet Take 1,000 mg by mouth every 6 (six) hours as needed for moderate pain.     amLODipine (NORVASC) 5 MG tablet TAKE 1 AND 1/2 TABLETS BY MOUTH DAILY 135 tablet 3   amoxicillin (AMOXIL) 500 MG tablet Take 4 tablets by mouth 1 hour  prior to dental procedures and cleanings 12 tablet 6   aspirin EC 81 MG tablet Take 81 mg by mouth daily. Swallow whole.     candesartan (ATACAND) 32 MG tablet TAKE 1 TABLET(32 MG) BY MOUTH DAILY 90 tablet 3   carboxymethylcellulose (REFRESH PLUS) 0.5 % SOLN Place 1 drop into both eyes 3 (three) times daily as needed (dry eyes).     carvedilol (COREG) 6.25 MG tablet TAKE 1 TABLET(6.25 MG) BY  MOUTH TWICE DAILY 180 tablet 3   ezetimibe (ZETIA) 10 MG tablet TAKE 1 TABLET(10 MG) BY MOUTH DAILY 90 tablet 2   fluticasone (FLONASE) 50 MCG/ACT nasal spray Place 1 spray into both nostrils daily as needed for allergies or rhinitis.     Multiple Minerals-Vitamins (CALCIUM & VIT D3 BONE HEALTH PO) Take 2 each by mouth daily. Chewables     Multiple Vitamins-Minerals (MULTIVITAMIN WITH MINERALS) tablet Take 2 tablets by mouth daily. Chewables     rosuvastatin (CRESTOR) 10 MG tablet TAKE 1 TABLET(10 MG) BY MOUTH DAILY 90 tablet 1   No current facility-administered medications for this visit.    ROS:   General:  No weight loss, Fever, chills  HEENT: No recent headaches, no nasal bleeding, no visual changes, no sore throat  Neurologic: No dizziness, blackouts, seizures. No recent symptoms of stroke or mini- stroke. No recent episodes of slurred speech, or temporary blindness.  Cardiac: No recent episodes of chest pain/pressure, no shortness of breath at rest.  No shortness of breath with exertion.  Denies history of atrial fibrillation or irregular heartbeat  Vascular: No history of rest pain in feet.  No history of claudication.  No history of non-healing ulcer, No history of DVT   Pulmonary: No home oxygen, no productive cough, no hemoptysis,  No asthma or wheezing  Musculoskeletal:  [ ]  Arthritis, [ ]  Low back pain,  [ ]  Joint pain  Hematologic:No history of hypercoagulable state.  No history of easy bleeding.  No history of anemia  Gastrointestinal: No hematochezia or melena,  No gastroesophageal reflux, no trouble swallowing  Urinary: [ ]  chronic Kidney disease, [ ]  on HD - [ ]  MWF or [ ]  TTHS, [ ]  Burning with urination, [ ]  Frequent urination, [ ]  Difficulty urinating;   Skin: No rashes  Psychological: No history of anxiety,  No history of depression   Physical Examination  Vitals:   04/19/23 0910  BP: (!) 161/80  Pulse: 75  Temp: 98.4 F (36.9 C)  TempSrc: Temporal   SpO2: 98%  Weight: 100 lb (45.4 kg)    Body mass index is 20.2 kg/m.  General:  Alert and oriented, no acute distress HEENT: Normal Neck: No bruit or JVD Pulmonary: Clear to auscultation bilaterally Cardiac: Regular Rate and Rhythm without murmur Gastrointestinal: Soft, non-tender, non-distended, no mass, no scars Skin: No rash Extremity Pulses: palpable radial pulses, DP pulses B  Musculoskeletal: No deformity or edema  Neurologic: Upper and lower extremity motor 5/5 and  symmetric  DATA:  Abdominal Aorta Findings:  +-----------+-------+----------+----------+--------+--------+--------------  ----+  Location  AP (cm)Trans (cm)PSV (cm/s)WaveformThrombusComments             +-----------+-------+----------+----------+--------+--------+--------------  ----+  Proximal  1.44   1.52      62                                             +-----------+-------+----------+----------+--------+--------+--------------  ----+  Mid       1.48   1.46      94                        ulcerated  plaque                                                          0.65cm  x0.65cm      +-----------+-------+----------+----------+--------+--------+--------------  ----+  Distal    1.47   1.70      76                                             +-----------+-------+----------+----------+--------+--------+--------------  ----+  RT CIA Prox1.0    1.0       135                                            +-----------+-------+----------+----------+--------+--------+--------------  ----+  RT CIA Mid 1.0    1.0       135                                            +-----------+-------+----------+----------+--------+--------+--------------  ----+     Summary:  Abdominal Aorta: No evidence of an abdominal aortic aneurysm was  visualized. Ulcerated plaque seen in the mid aorta.      ASSESSMENT/PLAN: There is no change in the aorta on duplex today.   Evidence of Ulcerated plaque seen in the mid aorta.  She remains asymptomatic for Common symptoms of aortic coarctation are high blood pressure, headaches, chest pain, shortness of breath, nosebleeds and claudication (leg pain while walking).  She will f/u in 1 year for repeat duplex.  If she has symptoms she will call 911.        Mosetta Pigeon PA-C [facility] [place] Office: [phone]  MD in clinic [place]

## 2023-04-25 ENCOUNTER — Other Ambulatory Visit: Payer: Self-pay | Admitting: Cardiology

## 2023-04-27 ENCOUNTER — Other Ambulatory Visit: Payer: Self-pay | Admitting: Physician Assistant

## 2023-05-01 ENCOUNTER — Encounter: Payer: Self-pay | Admitting: Internal Medicine

## 2023-05-05 ENCOUNTER — Other Ambulatory Visit: Payer: Self-pay

## 2023-05-05 DIAGNOSIS — I714 Abdominal aortic aneurysm, without rupture, unspecified: Secondary | ICD-10-CM

## 2023-05-10 NOTE — Progress Notes (Unsigned)
Subjective:    Patient ID: Renee Figueroa, female    DOB: Nov 27, 1933, 89 y.o.   MRN: [id number]      HPI Renee Figueroa is here for a Physical exam and her chronic medical problems.   Doing well. No concerns.    Medications and allergies reviewed with patient and updated if appropriate.  Current Outpatient Medications on File Prior to Visit  Medication Sig Dispense Refill   acetaminophen (TYLENOL) 500 MG tablet Take 1,000 mg by mouth every 6 (six) hours as needed for moderate pain.     amLODipine (NORVASC) 5 MG tablet TAKE 1 AND 1/2 TABLETS BY MOUTH DAILY 135 tablet 3   amoxicillin (AMOXIL) 500 MG tablet TAKE 4 TABLETS BY MOUTH 1 HOUR PRIOR TO DENTAL PROCEDURES AND CLEANINGS 12 tablet 6   aspirin EC 81 MG tablet Take 81 mg by mouth daily. Swallow whole.     candesartan (ATACAND) 32 MG tablet TAKE 1 TABLET(32 MG) BY MOUTH DAILY 90 tablet 3   carboxymethylcellulose (REFRESH PLUS) 0.5 % SOLN Place 1 drop into both eyes 3 (three) times daily as needed (dry eyes).     carvedilol (COREG) 6.25 MG tablet TAKE 1 TABLET(6.25 MG) BY MOUTH TWICE DAILY 180 tablet 3   ezetimibe (ZETIA) 10 MG tablet TAKE 1 TABLET(10 MG) BY MOUTH DAILY 90 tablet 2   fluticasone (FLONASE) 50 MCG/ACT nasal spray Place 1 spray into both nostrils daily as needed for allergies or rhinitis.     Multiple Minerals-Vitamins (CALCIUM & VIT D3 BONE HEALTH PO) Take 2 each by mouth daily. Chewables     Multiple Vitamins-Minerals (MULTIVITAMIN WITH MINERALS) tablet Take 2 tablets by mouth daily. Chewables     rosuvastatin (CRESTOR) 10 MG tablet TAKE 1 TABLET(10 MG) BY MOUTH DAILY 90 tablet 3   No current facility-administered medications on file prior to visit.    Review of Systems  Constitutional:  Negative for appetite change and fever.  Eyes:  Negative for visual disturbance.  Respiratory:  Negative for cough, shortness of breath and wheezing.   Cardiovascular:  Negative for chest pain, palpitations and leg swelling.   Gastrointestinal:  Negative for abdominal pain, blood in stool, constipation and diarrhea.       No gerd  Genitourinary:  Negative for dysuria.  Musculoskeletal:  Positive for arthralgias. Negative for back pain.  Skin:  Negative for rash.  Neurological:  Negative for light-headedness and headaches.  Psychiatric/Behavioral:  Negative for dysphoric mood and sleep disturbance. The patient is not nervous/anxious.        Objective:   Vitals:   05/11/23 1000  BP: (!) 140/68  Pulse: 80  Temp: 98 F (36.7 C)  SpO2: 95%   Filed Weights   05/11/23 1000  Weight: 102 lb (46.3 kg)   Body mass index is 20.6 kg/m.  BP Readings from Last 3 Encounters:  05/11/23 (!) 140/68  04/19/23 (!) 161/80  01/19/23 (!) 186/74    Wt Readings from Last 3 Encounters:  05/11/23 102 lb (46.3 kg)  04/19/23 100 lb (45.4 kg)  01/19/23 99 lb 12.8 oz (45.3 kg)       Physical Exam Constitutional: She appears well-developed and well-nourished. No distress.  HENT:  Head: Normocephalic and atraumatic.  Right Ear: External ear normal. Normal ear canal and TM Left Ear: External ear normal.  Normal ear canal and TM Mouth/Throat: Oropharynx is clear and moist.  Eyes: Conjunctivae normal.  Neck: Neck supple. No tracheal deviation present. No thyromegaly present.  No  carotid bruit  Cardiovascular: Normal rate, regular rhythm and normal heart sounds.   +2/6 systolic murmur heard.  No edema. Pulmonary/Chest: Effort normal and breath sounds normal. No respiratory distress. She has no wheezes. She has no rales.  Breast: deferred   Abdominal: Soft. She exhibits no distension. There is no tenderness.  Lymphadenopathy: She has no cervical adenopathy.  Skin: Skin is warm and dry. She is not diaphoretic.  Psychiatric: She has a normal mood and affect. Her behavior is normal.     Lab Results  Component Value Date   WBC 7.2 05/09/2022   HGB 13.7 05/09/2022   HCT 40.9 05/09/2022   PLT 220.0 05/09/2022    GLUCOSE 90 05/09/2022   CHOL 141 05/09/2022   TRIG 112.0 05/09/2022   HDL 63.60 05/09/2022   LDLDIRECT 156.8 10/24/2012   LDLCALC 55 05/09/2022   ALT 16 05/09/2022   AST 20 05/09/2022   NA 139 05/09/2022   K 4.1 05/09/2022   CL 102 05/09/2022   CREATININE 0.73 05/09/2022   BUN 17 05/09/2022   CO2 28 05/09/2022   TSH 1.73 05/09/2022   INR 1.0 10/16/2020   HGBA1C 5.5 05/04/2020         Assessment & Plan:   Physical exam: Screening blood work  ordered Exercise  none - very active Weight  normal Substance abuse  none   Reviewed recommended immunizations. Flu immunization administered today.     Health Maintenance  Topic Date Due   DTaP/Tdap/Td (2 - Td or Tdap) 10/25/2022   COVID-19 Vaccine (7 - 2023-24 season) 09/01/2023   Medicare Annual Wellness (AWV)  11/02/2023   DEXA SCAN  03/29/2024   Pneumonia Vaccine 46+ Years old  Completed   INFLUENZA VACCINE  Completed   Zoster Vaccines- Shingrix  Completed   HPV VACCINES  Aged Out          See Problem List for Assessment and Plan of chronic medical problems.

## 2023-05-10 NOTE — Patient Instructions (Addendum)
Flu immunization administered today.     Blood work was ordered.   The lab is on the first floor.    Medications changes include :   none    Return in about 1 year (around 05/10/2024) for Physical Exam.     Health Maintenance, Female Adopting a healthy lifestyle and getting preventive care are important in promoting health and wellness. Ask your health care provider about: The right schedule for you to have regular tests and exams. Things you can do on your own to prevent diseases and keep yourself healthy. What should I know about diet, weight, and exercise? Eat a healthy diet  Eat a diet that includes plenty of vegetables, fruits, low-fat dairy products, and lean protein. Do not eat a lot of foods that are high in solid fats, added sugars, or sodium. Maintain a healthy weight Body mass index (BMI) is used to identify weight problems. It estimates body fat based on height and weight. Your health care provider can help determine your BMI and help you achieve or maintain a healthy weight. Get regular exercise Get regular exercise. This is one of the most important things you can do for your health. Most adults should: Exercise for at least 150 minutes each week. The exercise should increase your heart rate and make you sweat (moderate-intensity exercise). Do strengthening exercises at least twice a week. This is in addition to the moderate-intensity exercise. Spend less time sitting. Even light physical activity can be beneficial. Watch cholesterol and blood lipids Have your blood tested for lipids and cholesterol at 87 years of age, then have this test every 5 years. Have your cholesterol levels checked more often if: Your lipid or cholesterol levels are high. You are older than 87 years of age. You are at high risk for heart disease. What should I know about cancer screening? Depending on your health history and family history, you may need to have cancer screening at  various ages. This may include screening for: Breast cancer. Cervical cancer. Colorectal cancer. Skin cancer. Lung cancer. What should I know about heart disease, diabetes, and high blood pressure? Blood pressure and heart disease High blood pressure causes heart disease and increases the risk of stroke. This is more likely to develop in people who have high blood pressure readings or are overweight. Have your blood pressure checked: Every 3-5 years if you are 55-29 years of age. Every year if you are 77 years old or older. Diabetes Have regular diabetes screenings. This checks your fasting blood sugar level. Have the screening done: Once every three years after age 67 if you are at a normal weight and have a low risk for diabetes. More often and at a younger age if you are overweight or have a high risk for diabetes. What should I know about preventing infection? Hepatitis B If you have a higher risk for hepatitis B, you should be screened for this virus. Talk with your health care provider to find out if you are at risk for hepatitis B infection. Hepatitis C Testing is recommended for: Everyone born from [birth year] through 1963. Anyone with known risk factors for hepatitis C. Sexually transmitted infections (STIs) Get screened for STIs, including gonorrhea and chlamydia, if: You are sexually active and are younger than 87 years of age. You are older than 87 years of age and your health care provider tells you that you are at risk for this type of infection. Your sexual activity has changed since  you were last screened, and you are at increased risk for chlamydia or gonorrhea. Ask your health care provider if you are at risk. Ask your health care provider about whether you are at high risk for HIV. Your health care provider may recommend a prescription medicine to help prevent HIV infection. If you choose to take medicine to prevent HIV, you should first get tested for HIV. You should then be  tested every 3 months for as long as you are taking the medicine. Pregnancy If you are about to stop having your period (premenopausal) and you may become pregnant, seek counseling before you get pregnant. Take 400 to 800 micrograms (mcg) of folic acid every day if you become pregnant. Ask for birth control (contraception) if you want to prevent pregnancy. Osteoporosis and menopause Osteoporosis is a disease in which the bones lose minerals and strength with aging. This can result in bone fractures. If you are 1 years old or older, or if you are at risk for osteoporosis and fractures, ask your health care provider if you should: Be screened for bone loss. Take a calcium or vitamin D supplement to lower your risk of fractures. Be given hormone replacement therapy (HRT) to treat symptoms of menopause. Follow these instructions at home: Alcohol use Do not drink alcohol if: Your health care provider tells you not to drink. You are pregnant, may be pregnant, or are planning to become pregnant. If you drink alcohol: Limit how much you have to: 0-1 drink a day. Know how much alcohol is in your drink. In the [place] one drink equals one 12 oz bottle of beer (355 mL), one 5 oz glass of wine (148 mL), or one 1 oz glass of hard liquor (44 mL). Lifestyle Do not use any products that contain nicotine or tobacco. These products include cigarettes, chewing tobacco, and vaping devices, such as e-cigarettes. If you need help quitting, ask your health care provider. Do not use street drugs. Do not share needles. Ask your health care provider for help if you need support or information about quitting drugs. General instructions Schedule regular health, dental, and eye exams. Stay current with your vaccines. Tell your health care provider if: You often feel depressed. You have ever been abused or do not feel safe at home. Summary Adopting a healthy lifestyle and getting preventive care are important in  promoting health and wellness. Follow your health care provider's instructions about healthy diet, exercising, and getting tested or screened for diseases. Follow your health care provider's instructions on monitoring your cholesterol and blood pressure. This information is not intended to replace advice given to you by your health care provider. Make sure you discuss any questions you have with your health care provider. Document Revised: 12/28/2020 Document Reviewed: 12/28/2020 Elsevier Patient Education  2024 [employer].

## 2023-05-11 ENCOUNTER — Telehealth: Payer: Self-pay

## 2023-05-11 ENCOUNTER — Ambulatory Visit (INDEPENDENT_AMBULATORY_CARE_PROVIDER_SITE_OTHER): Payer: Medicare Other | Admitting: Internal Medicine

## 2023-05-11 ENCOUNTER — Encounter: Payer: Self-pay | Admitting: Internal Medicine

## 2023-05-11 VITALS — BP 140/68 | HR 80 | Temp 98.0°F | Ht 59.0 in | Wt 102.0 lb

## 2023-05-11 DIAGNOSIS — Z23 Encounter for immunization: Secondary | ICD-10-CM

## 2023-05-11 DIAGNOSIS — Z0000 Encounter for general adult medical examination without abnormal findings: Secondary | ICD-10-CM | POA: Diagnosis not present

## 2023-05-11 DIAGNOSIS — M810 Age-related osteoporosis without current pathological fracture: Secondary | ICD-10-CM | POA: Diagnosis not present

## 2023-05-11 DIAGNOSIS — I10 Essential (primary) hypertension: Secondary | ICD-10-CM

## 2023-05-11 DIAGNOSIS — I25118 Atherosclerotic heart disease of native coronary artery with other forms of angina pectoris: Secondary | ICD-10-CM | POA: Diagnosis not present

## 2023-05-11 DIAGNOSIS — E782 Mixed hyperlipidemia: Secondary | ICD-10-CM | POA: Diagnosis not present

## 2023-05-11 LAB — CBC WITH DIFFERENTIAL/PLATELET
Basophils Absolute: 0 10*3/uL (ref 0.0–0.1)
Basophils Relative: 0.5 % (ref 0.0–3.0)
Eosinophils Absolute: 0.3 10*3/uL (ref 0.0–0.7)
Eosinophils Relative: 3 % (ref 0.0–5.0)
HCT: 39.9 % (ref 36.0–46.0)
Hemoglobin: 13.1 g/dL (ref 12.0–15.0)
Lymphocytes Relative: 35 % (ref 12.0–46.0)
Lymphs Abs: 3.3 10*3/uL (ref 0.7–4.0)
MCHC: 32.9 g/dL (ref 30.0–36.0)
MCV: 92.9 fl (ref 78.0–100.0)
Monocytes Absolute: 0.8 10*3/uL (ref 0.1–1.0)
Monocytes Relative: 8.3 % (ref 3.0–12.0)
Neutro Abs: 5 10*3/uL (ref 1.4–7.7)
Neutrophils Relative %: 53.2 % (ref 43.0–77.0)
Platelets: 244 10*3/uL (ref 150.0–400.0)
RBC: 4.29 Mil/uL (ref 3.87–5.11)
RDW: 12.7 % (ref 11.5–15.5)
WBC: 9.3 10*3/uL (ref 4.0–10.5)

## 2023-05-11 LAB — COMPREHENSIVE METABOLIC PANEL
ALT: 19 U/L (ref 0–35)
AST: 23 U/L (ref 0–37)
Albumin: 4.4 g/dL (ref 3.5–5.2)
Alkaline Phosphatase: 45 U/L (ref 39–117)
BUN: 13 mg/dL (ref 6–23)
CO2: 28 mEq/L (ref 19–32)
Calcium: 9.5 mg/dL (ref 8.4–10.5)
Chloride: 102 mEq/L (ref 96–112)
Creatinine, Ser: 0.71 mg/dL (ref 0.40–1.20)
GFR: 76.43 mL/min (ref >60.00)
Glucose, Bld: 96 mg/dL (ref 70–99)
Potassium: 4.1 mEq/L (ref 3.5–5.1)
Sodium: 137 mEq/L (ref 135–145)
Total Bilirubin: 0.5 mg/dL (ref 0.2–1.2)
Total Protein: 7.4 g/dL (ref 6.0–8.3)

## 2023-05-11 LAB — LIPID PANEL
Cholesterol: 131 mg/dL (ref 0–200)
HDL: 65.8 mg/dL (ref >39.00)
LDL Cholesterol: 42 mg/dL (ref 0–99)
NonHDL: 65.1
Total CHOL/HDL Ratio: 2
Triglycerides: 115 mg/dL (ref 0.0–149.0)
VLDL: 23 mg/dL (ref 0.0–40.0)

## 2023-05-11 LAB — TSH: TSH: 2.55 u[IU]/mL (ref 0.35–5.50)

## 2023-05-11 LAB — VITAMIN D 25 HYDROXY (VIT D DEFICIENCY, FRACTURES): VITD: 99.78 ng/mL (ref 30.00–100.00)

## 2023-05-11 NOTE — Assessment & Plan Note (Signed)
Chronic Blood pressure well controlled CMP, cbc Continue amlodipine 7.5 mg daily, Atacand 32 mg daily, Coreg 6.25 mg twice daily

## 2023-05-11 NOTE — Assessment & Plan Note (Addendum)
Chronic dexa up tod date On prolia every 6 months-continue - due now - will get prior auth Continue calcium and vitamin D Check vitamin D level She is very active-has not been walking as much due to the heat, but will start to walk a little bit more-encouraged regular exercise Discussed fall prevention-she denies falls

## 2023-05-11 NOTE — Assessment & Plan Note (Signed)
Chronic Following with cardiology Denies any symptoms consistent with angina Continue current medications

## 2023-05-11 NOTE — Assessment & Plan Note (Signed)
Chronic Regular exercise and healthy diet encouraged Check lipid panel, cmp, tsh Continue Crestor 10 mg daily

## 2023-05-16 ENCOUNTER — Telehealth: Payer: Self-pay

## 2023-05-16 ENCOUNTER — Other Ambulatory Visit (HOSPITAL_COMMUNITY): Payer: Self-pay

## 2023-05-16 NOTE — Telephone Encounter (Signed)
Pt ready for scheduling for Prolia on or after : 05/16/23  Out-of-pocket cost due at time of visit: $~327  Primary: [employer] - Medicare Prolia co-insurance: 20% Admin fee co-insurance: $20  Secondary: N/A Prolia co-insurance:  Admin fee co-insurance:   Medical Benefit Details: Date Benefits were checked: 05/16/23 Deductible: NO/ Coinsurance: 20%/ Admin Fee: $20  Prior Auth: APPROVED PA# [id number]  Expiration Date: 07/19/22 TO 07/20/23   # of doses approved:  Pharmacy benefit: Copay $300 If patient wants fill through the pharmacy benefit please send prescription to: [facility], and include estimated need by date in rx notes. Pharmacy will ship medication directly to the office.  Patient not eligible for Prolia Copay Card. Copay Card can make patient's cost as little as $25. Link to apply: [email or web address]://[email or web address].[email or web address]  ** This summary of benefits is an estimation of the patient's out-of-pocket cost. Exact cost may very based on individual plan coverage.

## 2023-05-18 NOTE — Telephone Encounter (Signed)
Message left for patient to return call to office to schedule appointment on nurse for Prolia.  Please see if she wants to get medication from here or if she wants it to go through her pharmacy.

## 2023-06-05 NOTE — Telephone Encounter (Signed)
Spoke with patient to schedule Prolia injection.   Patient is cleared for PROLIA injection on 06/07/23 per PA information on 05/16/23.   Mentioned in provider note last on 05/11/2023.  Medication is [facility] supplied

## 2023-06-06 ENCOUNTER — Ambulatory Visit: Payer: Medicare Other

## 2023-06-07 ENCOUNTER — Ambulatory Visit: Payer: Medicare Other

## 2023-06-07 DIAGNOSIS — M810 Age-related osteoporosis without current pathological fracture: Secondary | ICD-10-CM

## 2023-06-07 MED ORDER — DENOSUMAB 60 MG/ML ~~LOC~~ SOSY
60.0000 mg | PREFILLED_SYRINGE | Freq: Once | SUBCUTANEOUS | Status: AC
Start: 2023-06-07 — End: 2023-06-07
  Administered 2023-06-07: 60 mg via SUBCUTANEOUS

## 2023-06-07 NOTE — Progress Notes (Signed)
Pt received prolia today and responded well

## 2023-06-19 NOTE — Telephone Encounter (Signed)
Done

## 2023-06-28 ENCOUNTER — Ambulatory Visit: Payer: Medicare Other

## 2023-07-16 NOTE — Progress Notes (Unsigned)
Cardiology Office Note:    Date:  07/18/2023   ID:  Renee Figueroa, DOB July 26, 1934, MRN [id number]  PCP:  Pincus Sanes, MD  Cardiologist:  Little Ishikawa, MD  Electrophysiologist:  None   Referring MD: Pincus Sanes, MD   No chief complaint on file.    History of Present Illness:    Renee Figueroa is a 89 y.o. female with a hx of hypertension, hyperlipidemia who presents for follow-up.  She was referred by Dr. Lawerance Figueroa for evaluation of aortic stenosis, initially seen on 06/10/2020.  She denies any chest pain, shortness of breath, lightheadedness, or syncope.  Denies any palpitations or lower extremity edema.  Reports she does not exercise regularly.  Most exertion she does is walking around her house or walking the dog.  Reports BP is usually under good control at home, typically 120s.  No smoking history.  Father died of MI at age 29.  Echocardiogram on 05/20/2020 showed EF 70 to 75%, grade 1 diastolic dysfunction, severe mid cavitary obstruction (peak gradient 90 mmHg), normal RV function, severe aortic stenosis (mean gradient 40 mmHg).  Alternate imaging recommended to better assess aortic stenosis versus high flow state.  CTA was ordered, with aortic valve calcium score 1647, consistent with severe AS.  Coronary calcium score 476.  Proximal to mid LAD heavily calcified, unable to rule out obstructive CAD.  Cath on 08/26/2020 showed severe two-vessel CAD (80% proximal RCA, 90% proximal LAD, 80% mid LAD, 50% mid LAD) and severe aortic stenosis with mean gradient 53 mmHg.  Underwent PCI to the RCA on 09/1120, with plan to perform PCI on LAD after TAVR.  Underwent TAVR on 10/20/2020, no complications.  Echocardiogram on 11/25/2020 showed EF 70 to 75%, grade 1 diastolic dysfunction, normal RV function, 20 mm TAVR valve with mean gradient 18 mmHg.  Decision was made to hold off on PCI LAD unless having symptoms, just manage medically for now.  Echocardiogram 09/29/2021 showed EF 65 to 70%, grade 1  diastolic dysfunction, normal RV function, mild PVL, MG 16 mmHg.  Since last clinic visit, she reports she is doing well.  Denies any chest pain, dyspnea, lightheadedness, syncope, lower extremity edema, or palpitations.  Brought home BP log, shows BP 120-130s/50-60s at home.  Has not been exercising recently.   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 Neg 2013   CORONARY ATHERECTOMY  10/02/2020   CORONARY STENT INTERVENTION N/A 10/02/2020   Procedure: CORONARY STENT INTERVENTION;  Surgeon: Tonny Bollman, MD;  Location: [facility] INVASIVE CV LAB;  Service: Cardiovascular;  Laterality: N/A;   CORONARY ULTRASOUND/IVUS N/A 10/02/2020   Procedure: Intravascular Ultrasound/IVUS;  Surgeon: Excell Seltzer,  Casimiro Needle, MD;  Location: [facility] INVASIVE CV LAB;  Service: Cardiovascular;  Laterality: N/A;   DILATION AND CURETTAGE OF UTERUS     age 38   EYE SURGERY     G 2 P 2     RIGHT/LEFT HEART CATH AND CORONARY ANGIOGRAPHY N/A 08/26/2020   Procedure: RIGHT/LEFT HEART CATH AND CORONARY ANGIOGRAPHY;  Surgeon: Tonny Bollman, MD;  Location: [facility] INVASIVE CV LAB;  Service: Cardiovascular;  Laterality: N/A;   TEE WITHOUT CARDIOVERSION N/A 10/20/2020   Procedure: TRANSESOPHAGEAL ECHOCARDIOGRAM (TEE);  Surgeon: Tonny Bollman, MD;  Location: [facility] OR;  Service: Open Heart Surgery;  Laterality: N/A;   TONSILLECTOMY AND ADENOIDECTOMY      TRANSCATHETER AORTIC VALVE REPLACEMENT, TRANSFEMORAL N/A 10/20/2020   Procedure: TRANSCATHETER AORTIC VALVE REPLACEMENT, TRANSFEMORAL;  Surgeon: Tonny Bollman, MD;  Location: [facility] OR;  Service: Open Heart Surgery;  Laterality: N/A;   ULTRASOUND GUIDANCE FOR VASCULAR ACCESS Bilateral 10/20/2020   Procedure: ULTRASOUND GUIDANCE FOR VASCULAR ACCESS;  Surgeon: Tonny Bollman, MD;  Location: [facility] OR;  Service: Open Heart Surgery;  Laterality: Bilateral;    Current Medications: Current Meds  Medication Sig   acetaminophen (TYLENOL) 500 MG tablet Take 1,000 mg by mouth every 6 (six) hours as needed for moderate pain.   amLODipine (NORVASC) 5 MG tablet TAKE 1 AND 1/2 TABLETS BY MOUTH DAILY   amoxicillin (AMOXIL) 500 MG tablet TAKE 4 TABLETS BY MOUTH 1 HOUR PRIOR TO DENTAL PROCEDURES AND CLEANINGS   aspirin EC 81 MG tablet Take 81 mg by mouth daily. Swallow whole.   candesartan (ATACAND) 32 MG tablet TAKE 1 TABLET(32 MG) BY MOUTH DAILY   carboxymethylcellulose (REFRESH PLUS) 0.5 % SOLN Place 1 drop into both eyes 3 (three) times daily as needed (dry eyes).   carvedilol (COREG) 6.25 MG tablet TAKE 1 TABLET(6.25 MG) BY MOUTH TWICE DAILY   ezetimibe (ZETIA) 10 MG tablet TAKE 1 TABLET(10 MG) BY MOUTH DAILY   fluticasone (FLONASE) 50 MCG/ACT nasal spray Place 1 spray into both nostrils daily as needed for allergies or rhinitis.   Multiple Minerals-Vitamins (CALCIUM & VIT D3 BONE HEALTH PO) Take 2 each by mouth daily. Chewables   Multiple Vitamins-Minerals (MULTIVITAMIN WITH MINERALS) tablet Take 2 tablets by mouth daily. Chewables   rosuvastatin (CRESTOR) 10 MG tablet TAKE 1 TABLET(10 MG) BY MOUTH DAILY     Allergies:   Patient has no active allergies.   Social History   Socioeconomic History   Marital status: Married    Spouse name: Not on file   Number of children: 2   Years of education: Not on file   Highest education level: Not on file  Occupational History   Not on file  Tobacco Use    Smoking status: Never   Smokeless tobacco: Never  Vaping Use   Vaping status: Never Used  Substance and Sexual Activity   Alcohol use: Yes    Comment: maybe 1 drink a month   Drug use: No   Sexual activity: Not Currently  Other Topics Concern   Not on file  Social History Narrative   Not on file   Social Determinants of Health   Financial Resource Strain: Low Risk  (11/02/2022)   Overall Financial Resource Strain (CARDIA)    Difficulty of Paying Living Expenses: Not very hard  Food Insecurity: No Food Insecurity (11/02/2022)   Hunger Vital Sign    Worried About Running Out of Food in the Last Year: Never true    Ran Out of Food in the  Last Year: Never true  Transportation Needs: No Transportation Needs (11/02/2022)   PRAPARE - Administrator, Civil Service (Medical): No    Lack of Transportation (Non-Medical): No  Physical Activity: Insufficiently Active (11/02/2022)   Exercise Vital Sign    Days of Exercise per Week: 2 days    Minutes of Exercise per Session: 20 min  Stress: No Stress Concern Present (11/02/2022)   [employer] of Occupational Health - Occupational Stress Questionnaire    Feeling of Stress : Only a little  Social Connections: Socially Integrated (11/02/2022)   Social Connection and Isolation Panel [NHANES]    Frequency of Communication with Friends and Family: More than three times a week    Frequency of Social Gatherings with Friends and Family: More than three times a week    Attends Religious Services: More than 4 times per year    Active Member of [employer] or Organizations: Yes    Attends Engineer, structural: More than 4 times per year    Marital Status: Married     Family History: The patient's family history includes Heart attack (age of onset: 30) in her father; Hypertension in her mother; Stomach cancer in her mother. There is no history of Colon cancer, Colon polyps, Rectal cancer, Diabetes, or Stroke.  ROS:   Please see the  history of present illness.     All other systems reviewed and are negative.  EKGs/Labs/Other Studies Reviewed:    The following studies were reviewed today:   EKG:   12/30/2021: Normal sinus rhythm, rate 62, no ST abnormalities 01/19/2023: Normal sinus rhythm with PACs, no ST abnormalities, poor R wave progression  Recent Labs: 05/11/2023: ALT 19; BUN 13; Creatinine, Ser 0.71; Hemoglobin 13.1; Platelets 244.0; Potassium 4.1; Sodium 137; TSH 2.55  Recent Lipid Panel    Component Value Date/Time   CHOL 131 05/11/2023 1041   CHOL 142 12/30/2020 0832   TRIG 115.0 05/11/2023 1041   HDL 65.80 05/11/2023 1041   HDL 65 12/30/2020 0832   CHOLHDL 2 05/11/2023 1041   VLDL 23.0 05/11/2023 1041   LDLCALC 42 05/11/2023 1041   LDLCALC 64 12/30/2020 0832   LDLCALC 164 (H) 05/04/2020 1007   LDLDIRECT 156.8 10/24/2012 1207    Physical Exam:    VS:  BP (!) 170/70   Pulse 69   Ht 4\' 9"  (1.448 m)   Wt 101 lb (45.8 kg)   SpO2 90%   BMI 21.86 kg/m     Wt Readings from Last 3 Encounters:  07/18/23 101 lb (45.8 kg)  05/11/23 102 lb (46.3 kg)  04/19/23 100 lb (45.4 kg)     GEN:  in no acute distress HEENT: Normal NECK: No JVD CARDIAC: RRR, 2/6 systolic murmur loudest at RUSB RESPIRATORY:  Clear to auscultation without rales, wheezing or rhonchi  ABDOMEN: Soft, non-tender, non-distended MUSCULOSKELETAL:  No edema; No deformity  SKIN: Warm and dry NEUROLOGIC:  Alert and oriented x 3 PSYCHIATRIC:  Normal affect   ASSESSMENT:    1. S/P TAVR (transcatheter aortic valve replacement)   2. Coronary artery disease involving native coronary artery of native heart without angina pectoris   3. Essential hypertension   4. Hyperlipidemia, unspecified hyperlipidemia type     PLAN:    Aortic stenosis s/p TAVR 10/20/20: Cath on 08/26/2020 showed severe aortic stenosis with mean gradient 53 mmHg.  Underwent TAVR on 10/20/2020, no complications.  Echocardiogram on 11/25/2020 showed EF 70 to 75%, grade 1  diastolic dysfunction, normal  RV function, 20 mm sapient 3 valve with mean gradient 18 mmHg.  No symptoms to suggest valve dysfunction.  Echocardiogram 09/29/2021 showed EF 65 to 70%, grade 1 diastolic dysfunction, normal RV function, mild PVL, MG 16 mmHg.  CAD: Cath on 08/26/2020 showed severe two-vessel CAD (80% proximal RCA, 90% proximal LAD, 80% mid LAD, 50% mid LAD) and severe aortic stenosis with mean gradient 53 mmHg.  Underwent PCI to the RCA on 09/1120, with plan to perform PCI on LAD after TAVR.  Underwent TAVR on 10/20/2020, no complications. Plan was medical management and consider PCI if having symptoms.  Reports no anginal symptoms.  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  Asymptomatic.  Follows with Dr. Randie Heinz in vascular surgery, monitoring  Hypertension: On candesartan 32 mg daily, amlodipine 7.5 mg daily, Coreg 6.25 mg twice daily.  BP elevated in clinic today but appears controlled on home log.  Will continue to monitor  Hyperlipidemia: On rosuvastatin 10 mg daily and Zetia 10 mg daily.  LDL 42 on 04/2023   RTC in 6 months   Medication Adjustments/Labs and Tests Ordered: Current medicines are reviewed at length with the patient today.  Concerns regarding medicines are outlined above.  No orders of the defined types were placed in this encounter.    No orders of the defined types were placed in this encounter.    Patient Instructions  Medication Instructions:  No changes.  *If you need a refill on your cardiac medications before your next appointment, please call your pharmacy*   Follow-Up: At [facility], you and your health needs are our priority.  As part of our continuing mission to provide you with exceptional heart care, we have created designated Provider Care Teams.  These Care Teams include your primary Cardiologist (physician) and Advanced Practice Providers (APPs -   Physician Assistants and Nurse Practitioners) who all work together to provide you with the care you need, when you need it.  Your next appointment:   6 month(s)  Provider:   Little Ishikawa, MD         Signed, Little Ishikawa, MD  07/18/2023 2:36 PM    [facility]

## 2023-07-18 ENCOUNTER — Ambulatory Visit: Payer: Medicare Other | Attending: Cardiology | Admitting: Cardiology

## 2023-07-18 ENCOUNTER — Encounter: Payer: Self-pay | Admitting: Cardiology

## 2023-07-18 VITALS — BP 170/70 | HR 69 | Ht <= 58 in | Wt 101.0 lb

## 2023-07-18 DIAGNOSIS — Z952 Presence of prosthetic heart valve: Secondary | ICD-10-CM | POA: Diagnosis not present

## 2023-07-18 DIAGNOSIS — I10 Essential (primary) hypertension: Secondary | ICD-10-CM | POA: Diagnosis not present

## 2023-07-18 DIAGNOSIS — E785 Hyperlipidemia, unspecified: Secondary | ICD-10-CM

## 2023-07-18 DIAGNOSIS — I2510 Atherosclerotic heart disease of native coronary artery without angina pectoris: Secondary | ICD-10-CM

## 2023-07-18 NOTE — Patient Instructions (Signed)
Medication Instructions:  No changes.  *If you need a refill on your cardiac medications before your next appointment, please call your pharmacy*   Follow-Up: At [facility], you and your health needs are our priority.  As part of our continuing mission to provide you with exceptional heart care, we have created designated Provider Care Teams.  These Care Teams include your primary Cardiologist (physician) and Advanced Practice Providers (APPs -  Physician Assistants and Nurse Practitioners) who all work together to provide you with the care you need, when you need it.  Your next appointment:   6 month(s)  Provider:   Little Ishikawa, MD

## 2023-08-19 ENCOUNTER — Encounter: Payer: Self-pay | Admitting: Internal Medicine

## 2023-09-04 ENCOUNTER — Other Ambulatory Visit: Payer: Self-pay | Admitting: Cardiology

## 2023-09-20 ENCOUNTER — Telehealth: Payer: Self-pay | Admitting: Internal Medicine

## 2023-09-20 NOTE — Telephone Encounter (Signed)
Patient's husband dropped off document Handicap Placard, to be filled out by provider. Patient requested to send it back via Call Patient to pick up within 7-days. Document is located in providers tray at front office.Please advise at Mobile [phone] (mobile)   Patient's husband said he needs one for each of them.

## 2023-09-21 NOTE — Telephone Encounter (Signed)
Form completed and patient informed

## 2023-09-21 NOTE — Telephone Encounter (Signed)
Form complete and awaiting signature from Dr. Lawerance Bach.

## 2023-10-10 ENCOUNTER — Other Ambulatory Visit: Payer: Self-pay

## 2023-10-10 DIAGNOSIS — M810 Age-related osteoporosis without current pathological fracture: Secondary | ICD-10-CM

## 2023-10-10 MED ORDER — DENOSUMAB 60 MG/ML ~~LOC~~ SOSY
60.0000 mg | PREFILLED_SYRINGE | Freq: Once | SUBCUTANEOUS | Status: AC
Start: 2024-02-09 — End: 2023-12-06
  Administered 2023-12-06: 60 mg via SUBCUTANEOUS

## 2023-10-11 ENCOUNTER — Other Ambulatory Visit: Payer: Self-pay | Admitting: Cardiology

## 2023-11-10 ENCOUNTER — Encounter: Payer: Self-pay | Admitting: Internal Medicine

## 2023-11-15 ENCOUNTER — Telehealth: Payer: Self-pay

## 2023-11-15 NOTE — Telephone Encounter (Signed)
 Prolia VOB initiated via [email or web address]  Next Prolia inj DUE: 12/06/23

## 2023-11-17 ENCOUNTER — Other Ambulatory Visit (HOSPITAL_COMMUNITY): Payer: Self-pay

## 2023-11-17 NOTE — Telephone Encounter (Signed)
 Marland Kitchen

## 2023-11-17 NOTE — Telephone Encounter (Signed)
 Pt ready for scheduling for PROLIA on or after : 12/05/23  Option# 1: Buy/Bill (Office supplied medication)  Out-of-pocket cost due at time of clinic visit: $352  Number of injection/visits approved: 2  Primary: UHC MEDICARE Prolia co-insurance: 20% Admin fee co-insurance: $20  Secondary: --- Prolia co-insurance:  Admin fee co-insurance:   Medical Benefit Details: Date Benefits were checked: 11/15/23 Deductible: NO/ Coinsurance: 20%/ Admin Fee: $20  Prior Auth: APPROVED PA# [id number] Expiration Date: 12/05/23-12/04/24  # of doses approved: 2 ----------------------------------------------------------------------- Option# 2- Med Obtained from pharmacy:  Pharmacy benefit: Copay $555 (Paid to pharmacy) Admin Fee: $20 (Pay at clinic)  Prior Auth: N/A PA# Expiration Date:   # of doses approved:   If patient wants fill through the pharmacy benefit please send prescription to: [facility], and include estimated need by date in rx notes. Pharmacy will ship medication directly to the office.  Patient NOT eligible for Prolia Copay Card. Copay Card can make patient's cost as little as $25. Link to apply: [email or web address]  ** This summary of benefits is an estimation of the patient's out-of-pocket cost. Exact cost may very based on individual plan coverage.

## 2023-11-22 ENCOUNTER — Encounter: Payer: Self-pay | Admitting: Internal Medicine

## 2023-12-05 ENCOUNTER — Ambulatory Visit (INDEPENDENT_AMBULATORY_CARE_PROVIDER_SITE_OTHER)

## 2023-12-05 ENCOUNTER — Encounter: Payer: Self-pay | Admitting: Internal Medicine

## 2023-12-05 VITALS — Ht <= 58 in | Wt 101.0 lb

## 2023-12-05 DIAGNOSIS — Z0000 Encounter for general adult medical examination without abnormal findings: Secondary | ICD-10-CM | POA: Diagnosis not present

## 2023-12-05 NOTE — Progress Notes (Signed)
 Subjective:   Renee Figueroa is a 89 y.o. who presents for a Medicare Wellness preventive visit.  Visit Complete: Virtual I connected with  [name] on 12/05/23 by a audio enabled telemedicine application and verified that I am speaking with the correct person using two identifiers.  Patient Location: Home  Provider Location: Home Office  I discussed the limitations of evaluation and management by telemedicine. The patient expressed understanding and agreed to proceed.  Vital Signs: Because this visit was a virtual/telehealth visit, some criteria may be missing or patient reported. Any vitals not documented were not able to be obtained and vitals that have been documented are patient reported.  VideoDeclined- This patient declined Librarian, academic. Therefore the visit was completed with audio only.  Persons Participating in Visit: Patient.  AWV Questionnaire: Yes: Patient Medicare AWV questionnaire was completed by the patient on 12/04/2023; I have confirmed that all information answered by patient is correct and no changes since this date.  Cardiac Risk Factors include: advanced age (>53men, >34 women);hypertension;dyslipidemia;Other (see comment), Risk factor comments: CAD     Objective:    Today's Vitals   12/05/23 1338  Weight: 101 lb (45.8 kg)  Height: 4\' 9"  (1.448 m)   Body mass index is 21.86 kg/m.     12/05/2023    1:46 PM 11/02/2022    3:15 PM 10/16/2020   10:54 AM 10/16/2020    9:45 AM 10/02/2020    8:55 AM 08/26/2020   11:24 AM 02/14/2018    1:38 PM  Advanced Directives  Does Patient Have a Medical Advance Directive? Yes Yes Yes No Yes No Yes  Type of Estate agent of [place];Living will Healthcare Power of [place];Living will Healthcare Power of [place];Living will  Healthcare Power of [place];Living will  Healthcare Power of [place];Living will  Does patient want to make changes to medical advance  directive?    No - Patient declined No - Patient declined    Copy of Healthcare Power of Attorney in Chart? No - copy requested No - copy requested   No - copy requested  No - copy requested  Would patient like information on creating a medical advance directive?    No - Patient declined No - Patient declined No - Patient declined     Current Medications (verified) Outpatient Encounter Medications as of 12/05/2023  Medication Sig   acetaminophen (TYLENOL) 500 MG tablet Take 1,000 mg by mouth every 6 (six) hours as needed for moderate pain.   amLODipine (NORVASC) 5 MG tablet TAKE 1 AND 1/2 TABLETS BY MOUTH DAILY   amoxicillin (AMOXIL) 500 MG tablet TAKE 4 TABLETS BY MOUTH 1 HOUR PRIOR TO DENTAL PROCEDURES AND CLEANINGS   aspirin EC 81 MG tablet Take 81 mg by mouth daily. Swallow whole.   candesartan (ATACAND) 32 MG tablet TAKE 1 TABLET(32 MG) BY MOUTH DAILY   carboxymethylcellulose (REFRESH PLUS) 0.5 % SOLN Place 1 drop into both eyes 3 (three) times daily as needed (dry eyes).   carvedilol (COREG) 6.25 MG tablet TAKE 1 TABLET(6.25 MG) BY MOUTH TWICE DAILY   ezetimibe (ZETIA) 10 MG tablet TAKE 1 TABLET(10 MG) BY MOUTH DAILY   fluticasone (FLONASE) 50 MCG/ACT nasal spray Place 1 spray into both nostrils daily as needed for allergies or rhinitis.   Multiple Minerals-Vitamins (CALCIUM & VIT D3 BONE HEALTH PO) Take 2 each by mouth daily. Chewables   Multiple Vitamins-Minerals (MULTIVITAMIN WITH MINERALS) tablet Take 2 tablets by mouth daily.  Chewables   rosuvastatin (CRESTOR) 10 MG tablet TAKE 1 TABLET(10 MG) BY MOUTH DAILY   Facility-Administered Encounter Medications as of 12/05/2023  Medication   [START ON 02/09/2024] denosumab (PROLIA) injection 60 mg    Allergies (verified) Patient has no active allergies.   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 Neg 2013   CORONARY ATHERECTOMY  10/02/2020   CORONARY STENT INTERVENTION N/A 10/02/2020   Procedure: CORONARY STENT INTERVENTION;  Surgeon: Arnoldo Lapping, MD;  Location[facility] INVASIVE CV LAB;  Service: Cardiovascular;  Laterality: N/A;   CORONARY ULTRASOUND/IVUS N/A 10/02/2020   Procedure: Intravascular Ultrasound/IVUS;  Surgeon: Arnoldo Lapping, MD;  Location: [facility] INVASIVE CV LAB;  Service: Cardiovascular;  Laterality: N/A;   DILATION AND CURETTAGE OF UTERUS     age 84   EYE SURGERY     G 2 P 2     RIGHT/LEFT HEART CATH AND CORONARY ANGIOGRAPHY N/A 08/26/2020   Procedure: RIGHT/LEFT HEART CATH AND CORONARY ANGIOGRAPHY;  Surgeon: Arnoldo Lapping, MD;  Location: [facility] INVASIVE CV LAB;  Service: Cardiovascular;  Laterality: N/A;   TEE WITHOUT CARDIOVERSION N/A 10/20/2020   Procedure: TRANSESOPHAGEAL ECHOCARDIOGRAM (TEE);  Surgeon: Arnoldo Lapping, MD;  Location: [facility] OR;  Service: Open Heart Surgery;  Laterality: N/A;   TONSILLECTOMY AND ADENOIDECTOMY     TRANSCATHETER AORTIC VALVE REPLACEMENT, TRANSFEMORAL N/A 10/20/2020   Procedure: TRANSCATHETER AORTIC VALVE REPLACEMENT, TRANSFEMORAL;  Surgeon: Arnoldo Lapping, MD;  Location[facility] OR;  Service: Open Heart Surgery;  Laterality: N/A;   ULTRASOUND GUIDANCE FOR VASCULAR ACCESS Bilateral 10/20/2020   Procedure: ULTRASOUND GUIDANCE FOR VASCULAR ACCESS;  Surgeon: Arnoldo Lapping, MD;  Location: [facility] OR;  Service: Open Heart Surgery;  Laterality: Bilateral;   Family History  Problem Relation  Age of Onset   Hypertension Mother    Stomach cancer Mother        died @ 63   Heart attack Father 42   Colon cancer Neg Hx    Colon polyps Neg Hx    Rectal cancer Neg Hx    Diabetes Neg Hx    Stroke Neg Hx    Social History   Socioeconomic History   Marital status: Married    Spouse name: Optometrist   Number of children: 2   Years of education: Not on file   Highest education level: 12th grade  Occupational History   Occupation: RETIRED  Tobacco Use   Smoking status: Never   Smokeless tobacco: Never  Vaping Use   Vaping status: Never Used  Substance and Sexual Activity   Alcohol use: Yes    Comment: maybe 1 drink a month   Drug use: No   Sexual activity:  Not Currently  Other Topics Concern   Not on file  Social History Narrative   Lives with husband and 1 dog/2025   Social Drivers of Health   Financial Resource Strain: Low Risk  (12/04/2023)   Overall Financial Resource Strain (CARDIA)    Difficulty of Paying Living Expenses: Not very hard  Food Insecurity: No Food Insecurity (12/04/2023)   Hunger Vital Sign    Worried About Running Out of Food in the Last Year: Never true    Ran Out of Food in the Last Year: Never true  Transportation Needs: No Transportation Needs (12/04/2023)   PRAPARE - Administrator, Civil Service (Medical): No    Lack of Transportation (Non-Medical): No  Physical Activity: Insufficiently Active (12/04/2023)   Exercise Vital Sign    Days of Exercise per Week: 3 days    Minutes of Exercise per Session: 10 min  Stress: No Stress Concern Present (12/04/2023)   [employer] of Occupational Health - Occupational Stress Questionnaire    Feeling of Stress : Only a little  Social Connections: Unknown (12/04/2023)   Social Connection and Isolation Panel [NHANES]    Frequency of Communication with Friends and Family: Once a week    Frequency of Social Gatherings with Friends and Family: Not on file    Attends Religious Services: More than  4 times per year    Active Member of [employer] or Organizations: Yes    Attends Banker Meetings: Not on file    Marital Status: Married    Tobacco Counseling Counseling given: Not Answered    Clinical Intake:  Pre-visit preparation completed: Yes  Pain : No/denies pain     BMI - recorded: 21.86 Nutritional Status: BMI of 19-24  Normal Nutritional Risks: None Diabetes: No  Lab Results  Component Value Date   HGBA1C 5.5 05/04/2020   HGBA1C 5.7 05/02/2019     How often do you need to have someone help you when you read instructions, pamphlets, or other written materials from your doctor or pharmacy?: 1 - Never  Interpreter Needed?: No  Information entered by :: Bendell Leilany Digeronimo, RMA   Activities of Daily Living     12/04/2023   10:27 AM  In your present state of health, do you have any difficulty performing the following activities:  Hearing? 1  Comment Wears hearing aides  Vision? 0  Difficulty concentrating or making decisions? 0  Walking or climbing stairs? 0  Dressing or bathing? 0  Doing errands, shopping? 0  Preparing Food and eating ? N  Using the Toilet? N  In the past six months, have you accidently leaked urine? Y  Do you have problems with loss of bowel control? N  Managing your Medications? N  Managing your Finances? N  Housekeeping or managing your Housekeeping? N    Patient Care Team: Pincus Sanes, MD as PCP - General (Internal Medicine) Little Ishikawa, MD as PCP - Cardiology (Cardiology) Ilda Mori, MD as Consulting Physician (Obstetrics and Gynecology) Antony Contras, MD as Consulting Physician (Ophthalmology)  Indicate any recent Medical Services you may have received from other than [facility] providers in the past year (date may be approximate).     Assessment:   This is a routine wellness examination for Curtina.  Hearing/Vision screen Hearing Screening - Comments:: Wears hearing aides Vision Screening - Comments::  Implants/cataract surgery   Goals Addressed             This Visit's Progress  Continue to love life, and family. Stay healthy and as independent as possible. Continue to eat healthy and exercise.   On track      Depression Screen     12/05/2023    1:49 PM 11/02/2022    3:10 PM 05/09/2022   10:39 AM 05/07/2021    1:14 PM 05/04/2020    9:50 AM 05/02/2019    9:02 AM 02/14/2018    2:44 PM  PHQ 2/9 Scores  PHQ - 2 Score 0 0 0 0 0 0 0  PHQ- 9 Score 0  0        Fall Risk     12/04/2023   10:27 AM 11/02/2022    3:13 PM 11/01/2022    3:30 PM 05/09/2022   10:40 AM 05/09/2022   10:39 AM  Fall Risk   Falls in the past year? 0 0 0 0 0  Number falls in past yr: 0 0 0 0 0  Injury with Fall?  0  0 0  Risk for fall due to :  No Fall Risks  No Fall Risks No Fall Risks  Follow up Falls evaluation completed;Falls prevention discussed Falls prevention discussed  Falls evaluation completed Falls evaluation completed    MEDICARE RISK AT HOME:  Medicare Risk at Home Any stairs in or around the home?: (Patient-Rptd) Yes If so, are there any without handrails?: (Patient-Rptd) No Home free of loose throw rugs in walkways, pet beds, electrical cords, etc?: (Patient-Rptd) Yes Adequate lighting in your home to reduce risk of falls?: (Patient-Rptd) Yes Life alert?: (Patient-Rptd) No Use of a cane, walker or w/c?: (Patient-Rptd) No Grab bars in the bathroom?: (Patient-Rptd) No Shower chair or bench in shower?: (Patient-Rptd) Yes Elevated toilet seat or a handicapped toilet?: (Patient-Rptd) Yes  TIMED UP AND GO:  Was the test performed?  No  Cognitive Function: 6CIT completed    02/14/2018    1:39 PM  MMSE - Mini Mental State Exam  Orientation to time 5  Orientation to Place 5  Registration 3  Attention/ Calculation 5  Recall 2  Language- name 2 objects 2  Language- repeat 1  Language- follow 3 step command 3  Language- read & follow direction 1  Write a sentence 1  Copy design 1   Total score 29        12/05/2023    1:46 PM 11/02/2022    3:15 PM  6CIT Screen  What Year? 0 points 0 points  What month? 0 points 0 points  What time? 0 points 0 points  Count back from 20 0 points 0 points  Months in reverse 0 points 0 points  Repeat phrase 0 points 0 points  Total Score 0 points 0 points    Immunizations Immunization History  Administered Date(s) Administered   Fluad Quad(high Dose 65+) 05/02/2019, 05/04/2020, 05/07/2021, 05/09/2022   Fluad Trivalent(High Dose 65+) 05/11/2023   H1N1 08/27/2008   Influenza Split 06/09/2011, 06/07/2012   Influenza Whole 05/26/2009, 05/19/2010   Influenza, High Dose Seasonal PF 05/24/2016, 05/24/2018   Influenza,inj,Quad PF,6+ Mos 06/11/2013, 05/21/2014, 05/22/2015   Influenza-Unspecified 05/02/2017, 05/09/2022   PFIZER(Purple Top)SARS-COV-2 Vaccination 08/31/2019, 09/21/2019, 04/24/2020, 04/28/2021   Pfizer Covid-19 Vaccine Bivalent Booster 48yrs & up 12/17/2021   Pfizer(Comirnaty)Fall Seasonal Vaccine 12 years and older 05/02/2023, 11/23/2023   Pneumococcal Conjugate-13 07/15/2015   Pneumococcal Polysaccharide-23 06/22/2002   Tdap 10/24/2012   Zoster Recombinant(Shingrix) 12/16/2021, 02/17/2022   Zoster, Live 11/06/2012    Screening Tests Health Maintenance  Topic Date Due  DTaP/Tdap/Td (2 - Td or Tdap) 10/25/2022   INFLUENZA VACCINE  03/22/2024   DEXA SCAN  03/29/2024   Medicare Annual Wellness (AWV)  12/04/2024   Pneumonia Vaccine 53+ Years old  Completed   COVID-19 Vaccine  Completed   Zoster Vaccines- Shingrix  Completed   HPV VACCINES  Aged Out   Meningococcal B Vaccine  Aged Out    Health Maintenance  Health Maintenance Due  Topic Date Due   DTaP/Tdap/Td (2 - Td or Tdap) 10/25/2022   Health Maintenance Items Addressed: See Nurse Notes  Additional Screening:  Vision Screening: Recommended annual ophthalmology exams for early detection of glaucoma and other disorders of the eye.  Dental  Screening: Recommended annual dental exams for proper oral hygiene  Community Resource Referral / Chronic Care Management: CRR required this visit?  No   CCM required this visit?  No     Plan:     I have personally reviewed and noted the following in the patient's chart:   Medical and social history Use of alcohol, tobacco or illicit drugs  Current medications and supplements including opioid prescriptions. Patient is not currently taking opioid prescriptions. Functional ability and status Nutritional status Physical activity Advanced directives List of other physicians Hospitalizations, surgeries, and ER visits in previous 12 months Vitals Screenings to include cognitive, depression, and falls Referrals and appointments  In addition, I have reviewed and discussed with patient certain preventive protocols, quality metrics, and best practice recommendations. A written personalized care plan for preventive services as well as general preventive health recommendations were provided to patient.     Cliffton Spradley L Rulon Abdalla, CMA   12/05/2023   After Visit Summary: (MyChart) Due to this being a telephonic visit, the after visit summary with patients personalized plan was offered to patient via MyChart   Notes: Please refer to Routing Comments.

## 2023-12-05 NOTE — Patient Instructions (Signed)
 Ms. Kring , Thank you for taking time to come for your Medicare Wellness Visit. I appreciate your ongoing commitment to your health goals. Please review the following plan we discussed and let me know if I can assist you in the future.   Referrals/Orders/Follow-Ups/Clinician Recommendations: It was nice talking with you today.   You are due for a Tetanus vaccine.  Keep up the good work.  This is a list of the screening recommended for you and due dates:  Health Maintenance  Topic Date Due   DTaP/Tdap/Td vaccine (2 - Td or Tdap) 10/25/2022   COVID-19 Vaccine (7 - 2024-25 season) 10/30/2023   Medicare Annual Wellness Visit  11/02/2023   Flu Shot  03/22/2024   DEXA scan (bone density measurement)  03/29/2024   Pneumonia Vaccine  Completed   Zoster (Shingles) Vaccine  Completed   HPV Vaccine  Aged Out   Meningitis B Vaccine  Aged Out    Advanced directives: (Copy Requested) Please bring a copy of your health care power of attorney and living will to the office to be added to your chart at your convenience. You can mail to [facility] [address] [place], [place] [address] or email to [email or web address]  Next Medicare Annual Wellness Visit scheduled for next year: Yes

## 2023-12-06 ENCOUNTER — Other Ambulatory Visit: Payer: Self-pay

## 2023-12-06 ENCOUNTER — Ambulatory Visit

## 2023-12-06 DIAGNOSIS — M810 Age-related osteoporosis without current pathological fracture: Secondary | ICD-10-CM

## 2023-12-06 MED ORDER — DENOSUMAB 60 MG/ML ~~LOC~~ SOSY
60.0000 mg | PREFILLED_SYRINGE | Freq: Once | SUBCUTANEOUS | Status: AC
Start: 2024-06-06 — End: ?
  Administered 2024-06-07: 60 mg via SUBCUTANEOUS

## 2023-12-06 NOTE — Progress Notes (Signed)
 Patient visits today for their prolia injection. Patient informed of what they had received and tolerated injection well. Patient notified to reach out to office if needed.

## 2023-12-13 DIAGNOSIS — H40053 Ocular hypertension, bilateral: Secondary | ICD-10-CM | POA: Diagnosis not present

## 2023-12-13 DIAGNOSIS — H52203 Unspecified astigmatism, bilateral: Secondary | ICD-10-CM | POA: Diagnosis not present

## 2023-12-13 DIAGNOSIS — Z961 Presence of intraocular lens: Secondary | ICD-10-CM | POA: Diagnosis not present

## 2024-01-07 ENCOUNTER — Other Ambulatory Visit: Payer: Self-pay | Admitting: Cardiology

## 2024-01-12 ENCOUNTER — Other Ambulatory Visit: Payer: Self-pay | Admitting: Cardiology

## 2024-03-24 NOTE — Progress Notes (Unsigned)
 Cardiology Office Note:    Date:  03/26/2024   ID:  Renee Figueroa, DOB [birth year], MRN [id number]  PCP:  Geofm Glade PARAS, MD  Cardiologist:  Lonni LITTIE Nanas, MD  Electrophysiologist:  None   Referring MD: Geofm Glade PARAS, MD   No chief complaint on file.    History of Present Illness:    Renee Figueroa is a 89 y.o. female with a hx of hypertension, hyperlipidemia who presents for follow-up.  She was referred by Dr. Geofm for evaluation of aortic stenosis, initially seen on 06/10/2020.  She denies any chest pain, shortness of breath, lightheadedness, or syncope.  Denies any palpitations or lower extremity edema.  Reports she does not exercise regularly.  Most exertion she does is walking around her house or walking the dog.  Reports BP is usually under good control at home, typically 120s.  No smoking history.  Father died of MI at age 41.  Echocardiogram on 05/20/2020 showed EF 70 to 75%, grade 1 diastolic dysfunction, severe mid cavitary obstruction (peak gradient 90 mmHg), normal RV function, severe aortic stenosis (mean gradient 40 mmHg).  Alternate imaging recommended to better assess aortic stenosis versus high flow state.  CTA was ordered, with aortic valve calcium  score 1647, consistent with severe AS.  Coronary calcium  score 476.  Proximal to mid LAD heavily calcified, unable to rule out obstructive CAD.  Cath on 08/26/2020 showed severe two-vessel CAD (80% proximal RCA, 90% proximal LAD, 80% mid LAD, 50% mid LAD) and severe aortic stenosis with mean gradient 53 mmHg.  Underwent PCI to the RCA on 09/1120, with plan to perform PCI on LAD after TAVR.  Underwent TAVR on 10/20/2020, no complications.  Echocardiogram on 11/25/2020 showed EF 70 to 75%, grade 1 diastolic dysfunction, normal RV function, 20 mm TAVR valve with mean gradient 18 mmHg.  Decision was made to hold off on PCI LAD unless having symptoms, just manage medically for now.  Echocardiogram 09/29/2021 showed EF 65 to 70%, grade 1  diastolic dysfunction, normal RV function, mild PVL, MG 16 mmHg.  Since last clinic visit, she reports she is doing well.  Denies any chest pain, dyspnea, lightheadedness, syncope, lower extremity edema, or palpitations.  Has not been exercising but stays busy.  Reports BP 110-120s when checks at home.    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 Neg 2013   CORONARY ATHERECTOMY  10/02/2020   CORONARY STENT INTERVENTION N/A 10/02/2020   Procedure: CORONARY STENT INTERVENTION;  Surgeon: Wonda Sharper, MD;  Location: [facility] INVASIVE CV LAB;  Service: Cardiovascular;  Laterality: N/A;   CORONARY ULTRASOUND/IVUS N/A 10/02/2020   Procedure: Intravascular Ultrasound/IVUS;  Surgeon:  Wonda Sharper, MD;  Location: [facility] INVASIVE CV LAB;  Service: Cardiovascular;  Laterality: N/A;   DILATION AND CURETTAGE OF UTERUS     age 45   EYE SURGERY     G 2 P 2     RIGHT/LEFT HEART CATH AND CORONARY ANGIOGRAPHY N/A 08/26/2020   Procedure: RIGHT/LEFT HEART CATH AND CORONARY ANGIOGRAPHY;  Surgeon: Wonda Sharper, MD;  Location: [facility] INVASIVE CV LAB;  Service: Cardiovascular;  Laterality: N/A;   TEE WITHOUT CARDIOVERSION N/A 10/20/2020   Procedure: TRANSESOPHAGEAL ECHOCARDIOGRAM (TEE);  Surgeon: Wonda Sharper, MD;  Location: [facility] OR;  Service: Open Heart Surgery;  Laterality: N/A;   TONSILLECTOMY AND ADENOIDECTOMY      TRANSCATHETER AORTIC VALVE REPLACEMENT, TRANSFEMORAL N/A 10/20/2020   Procedure: TRANSCATHETER AORTIC VALVE REPLACEMENT, TRANSFEMORAL;  Surgeon: Wonda Sharper, MD;  Location: [facility] OR;  Service: Open Heart Surgery;  Laterality: N/A;   ULTRASOUND GUIDANCE FOR VASCULAR ACCESS Bilateral 10/20/2020   Procedure: ULTRASOUND GUIDANCE FOR VASCULAR ACCESS;  Surgeon: Wonda Sharper, MD;  Location: [facility] OR;  Service: Open Heart Surgery;  Laterality: Bilateral;    Current Medications: Current Meds  Medication Sig   acetaminophen  (TYLENOL ) 500 MG tablet Take 1,000 mg by mouth every 6 (six) hours as needed for moderate pain.   amoxicillin  (AMOXIL ) 500 MG tablet TAKE 4 TABLETS BY MOUTH 1 HOUR PRIOR TO DENTAL PROCEDURES AND CLEANINGS   aspirin  EC 81 MG tablet Take 81 mg by mouth daily. Swallow whole.   carboxymethylcellulose (REFRESH PLUS) 0.5 % SOLN Place 1 drop into both eyes 3 (three) times daily as needed (dry eyes).   fluticasone  (FLONASE ) 50 MCG/ACT nasal spray Place 1 spray into both nostrils daily as needed for allergies or rhinitis.   Multiple Minerals-Vitamins (CALCIUM  & VIT D3 BONE HEALTH PO) Take 2 each by mouth daily. Chewables   Multiple Vitamins-Minerals (MULTIVITAMIN WITH MINERALS) tablet Take 2 tablets by mouth daily. Chewables   [DISCONTINUED] amLODipine  (NORVASC ) 5 MG tablet TAKE 1 AND 1/2 TABLETS BY MOUTH DAILY   [DISCONTINUED] candesartan  (ATACAND ) 32 MG tablet TAKE 1 TABLET(32 MG) BY MOUTH DAILY   [DISCONTINUED] carvedilol  (COREG ) 6.25 MG tablet TAKE 1 TABLET(6.25 MG) BY MOUTH TWICE DAILY   [DISCONTINUED] ezetimibe  (ZETIA ) 10 MG tablet TAKE 1 TABLET(10 MG) BY MOUTH DAILY   [DISCONTINUED] rosuvastatin  (CRESTOR ) 10 MG tablet TAKE 1 TABLET(10 MG) BY MOUTH DAILY   Current Facility-Administered Medications for the 03/26/24 encounter (Office Visit) with Kate Lonni CROME, MD  Medication   [START ON 06/06/2024] denosumab  (PROLIA ) injection 60 mg     Allergies:   Patient has no active  allergies.   Social History   Socioeconomic History   Marital status: Married    Spouse name: Optometrist   Number of children: 2   Years of education: Not on file   Highest education level: 12th grade  Occupational History   Occupation: RETIRED  Tobacco Use   Smoking status: Never   Smokeless tobacco: Never  Vaping Use   Vaping status: Never Used  Substance and Sexual Activity   Alcohol use: Yes    Comment: maybe 1 drink a month   Drug use: No   Sexual activity: Not Currently  Other Topics Concern   Not on file  Social History Narrative   Lives with husband and 1 dog/2025   Social Drivers of Health   Financial Resource Strain: Low Risk  (12/04/2023)   Overall Financial Resource Strain (CARDIA)    Difficulty of Paying Living Expenses: Not very hard  Food Insecurity:  No Food Insecurity (12/04/2023)   Hunger Vital Sign    Worried About Running Out of Food in the Last Year: Never true    Ran Out of Food in the Last Year: Never true  Transportation Needs: No Transportation Needs (12/04/2023)   PRAPARE - Administrator, Civil Service (Medical): No    Lack of Transportation (Non-Medical): No  Physical Activity: Insufficiently Active (12/04/2023)   Exercise Vital Sign    Days of Exercise per Week: 3 days    Minutes of Exercise per Session: 10 min  Stress: No Stress Concern Present (12/04/2023)   [employer] of Occupational Health - Occupational Stress Questionnaire    Feeling of Stress : Only a little  Social Connections: Unknown (12/04/2023)   Social Connection and Isolation Panel    Frequency of Communication with Friends and Family: Once a week    Frequency of Social Gatherings with Friends and Family: Not on file    Attends Religious Services: More than 4 times per year    Active Member of [employer] or Organizations: Yes    Attends Banker Meetings: Not on file    Marital Status: Married     Family History: The patient's family history includes  Heart attack (age of onset: 40) in her father; Hypertension in her mother; Stomach cancer in her mother. There is no history of Colon cancer, Colon polyps, Rectal cancer, Diabetes, or Stroke.  ROS:   Please see the history of present illness.     All other systems reviewed and are negative.  EKGs/Labs/Other Studies Reviewed:    The following studies were reviewed today:   EKG:   12/30/2021: Normal sinus rhythm, rate 62, no ST abnormalities 01/19/2023: Normal sinus rhythm with PACs, no ST abnormalities, poor R wave progression 03/26/2024: Normal sinus rhythm, rate 73, no ST abnormalities  Recent Labs: 05/11/2023: ALT 19; BUN 13; Creatinine, Ser 0.71; Hemoglobin 13.1; Platelets 244.0; Potassium 4.1; Sodium 137; TSH 2.55  Recent Lipid Panel    Component Value Date/Time   CHOL 131 05/11/2023 1041   CHOL 142 12/30/2020 0832   TRIG 115.0 05/11/2023 1041   HDL 65.80 05/11/2023 1041   HDL 65 12/30/2020 0832   CHOLHDL 2 05/11/2023 1041   VLDL 23.0 05/11/2023 1041   LDLCALC 42 05/11/2023 1041   LDLCALC 64 12/30/2020 0832   LDLCALC 164 (H) 05/04/2020 1007   LDLDIRECT 156.8 10/24/2012 1207    Physical Exam:    VS:  BP (!) 164/70   Pulse 74   Ht 4' 9 (1.448 m)   Wt 100 lb 14.4 oz (45.8 kg)   SpO2 90%   BMI 21.83 kg/m     Wt Readings from Last 3 Encounters:  03/26/24 100 lb 14.4 oz (45.8 kg)  12/05/23 101 lb (45.8 kg)  07/18/23 101 lb (45.8 kg)     GEN:  in no acute distress HEENT: Normal NECK: No JVD CARDIAC: RRR, 2/6 systolic murmur loudest at RUSB RESPIRATORY:  Clear to auscultation without rales, wheezing or rhonchi  ABDOMEN: Soft, non-tender, non-distended MUSCULOSKELETAL:  No edema; No deformity  SKIN: Warm and dry NEUROLOGIC:  Alert and oriented x 3 PSYCHIATRIC:  Normal affect   ASSESSMENT:    1. S/P TAVR (transcatheter aortic valve replacement)   2. Coronary artery disease involving native coronary artery of native heart without angina pectoris   3. Essential  hypertension   4. Hyperlipidemia, unspecified hyperlipidemia type   5. Penetrating ulcer of aorta (HCC)  PLAN:    Aortic stenosis s/p TAVR 10/20/20: Cath on 08/26/2020 showed severe aortic stenosis with mean gradient 53 mmHg.  Underwent TAVR on 10/20/2020, no complications.  Echocardiogram on 11/25/2020 showed EF 70 to 75%, grade 1 diastolic dysfunction, normal RV function, 20 mm sapient 3 valve with mean gradient 18 mmHg.  No symptoms to suggest valve dysfunction.  Echocardiogram 09/29/2021 showed EF 65 to 70%, grade 1 diastolic dysfunction, normal RV function, mild PVL, MG 16 mmHg.  CAD: Cath on 08/26/2020 showed severe two-vessel CAD (80% proximal RCA, 90% proximal LAD, 80% mid LAD, 50% mid LAD) and severe aortic stenosis with mean gradient 53 mmHg.  Underwent PCI to the RCA on 09/1120, with plan to perform PCI on LAD after TAVR.  Underwent TAVR on 10/20/2020, no complications. Plan was medical management and consider PCI if having symptoms.  Reports no anginal symptoms.  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  Asymptomatic.  Follows with Dr. Sheree in vascular surgery, monitoring  Hypertension: On candesartan  32 mg daily, amlodipine  7.5 mg daily, Coreg  6.25 mg twice daily.  BP elevated in clinic today but appears controlled at home.  Will continue to monitor.  Check BMET  Hyperlipidemia: On rosuvastatin  10 mg daily and Zetia  10 mg daily.  LDL 42 on 04/2023.  Will update lipid panel   RTC in 6 months   Medication Adjustments/Labs and Tests Ordered: Current medicines are reviewed at length with the patient today.  Concerns regarding medicines are outlined above.  Orders Placed This Encounter  Procedures   Basic Metabolic Panel (BMET)   Lipid panel   EKG 12-Lead     Meds ordered this encounter  Medications   amLODipine  (NORVASC ) 5 MG tablet    Sig: Take 1.5 tablets (7.5 mg total) by mouth daily.     Dispense:  135 tablet    Refill:  3   candesartan  (ATACAND ) 32 MG tablet    Sig: Take 1 tablet (32 mg total) by mouth daily.    Dispense:  90 tablet    Refill:  3   carvedilol  (COREG ) 6.25 MG tablet    Sig: Take 1 tablet (6.25 mg total) by mouth 2 (two) times daily with a meal.    Dispense:  180 tablet    Refill:  3    Please schedule 6 month follow up appt due in May [phone]. Thank you   ezetimibe  (ZETIA ) 10 MG tablet    Sig: Take 1 tablet (10 mg total) by mouth daily.    Dispense:  90 tablet    Refill:  3   rosuvastatin  (CRESTOR ) 10 MG tablet    Sig: Take 1 tablet (10 mg total) by mouth daily.    Dispense:  90 tablet    Refill:  3     Patient Instructions  Medication Instructions:  Continue current medication *If you need a refill on your cardiac medications before your next appointment, please call your pharmacy*  Lab Work: Bmet lipid If you have labs (blood work) drawn today and your tests are completely normal, you will receive your results only by: MyChart Message (if you have MyChart) OR A paper copy in the mail If you have any lab test that is abnormal or we need to change your treatment, we will call you to review the results.  Testing/Procedures:   Follow-Up: At [facility], you and your health needs are our priority.  As  part of our continuing mission to provide you with exceptional heart care, our providers are all part of one team.  This team includes your primary Cardiologist (physician) and Advanced Practice Providers or APPs (Physician Assistants and Nurse Practitioners) who all work together to provide you with the care you need, when you need it.  Your next appointment:   6 month(s)  Provider:   Lonni LITTIE Nanas, MD    We recommend signing up for the patient portal called MyChart.  Sign up information is provided on this After Visit Summary.  MyChart is used to connect with patients for Virtual Visits (Telemedicine).  Patients are  able to view lab/test results, encounter notes, upcoming appointments, etc.  Non-urgent messages can be sent to your provider as well.   To learn more about what you can do with MyChart, go to [email or web address].   Other Instructions none       Signed, Lonni LITTIE Nanas, MD  03/26/2024 9:01 AM    [facility]

## 2024-03-26 ENCOUNTER — Encounter: Payer: Self-pay | Admitting: Cardiology

## 2024-03-26 ENCOUNTER — Ambulatory Visit: Attending: Cardiovascular Disease | Admitting: Cardiology

## 2024-03-26 VITALS — BP 164/70 | HR 74 | Ht <= 58 in | Wt 100.9 lb

## 2024-03-26 DIAGNOSIS — I10 Essential (primary) hypertension: Secondary | ICD-10-CM | POA: Diagnosis not present

## 2024-03-26 DIAGNOSIS — E785 Hyperlipidemia, unspecified: Secondary | ICD-10-CM

## 2024-03-26 DIAGNOSIS — I2510 Atherosclerotic heart disease of native coronary artery without angina pectoris: Secondary | ICD-10-CM | POA: Diagnosis not present

## 2024-03-26 DIAGNOSIS — I719 Aortic aneurysm of unspecified site, without rupture: Secondary | ICD-10-CM | POA: Diagnosis not present

## 2024-03-26 DIAGNOSIS — Z952 Presence of prosthetic heart valve: Secondary | ICD-10-CM

## 2024-03-26 LAB — LIPID PANEL

## 2024-03-26 MED ORDER — EZETIMIBE 10 MG PO TABS: 10.0000 mg | ORAL_TABLET | Freq: Every day | ORAL | 3 refills | Status: DC

## 2024-03-26 MED ORDER — CANDESARTAN CILEXETIL 32 MG PO TABS
32.0000 mg | ORAL_TABLET | Freq: Every day | ORAL | 3 refills | Status: AC
Start: 2024-03-26 — End: ?

## 2024-03-26 MED ORDER — CARVEDILOL 6.25 MG PO TABS: 6.2500 mg | ORAL_TABLET | Freq: Two times a day (BID) | ORAL | 3 refills | Status: AC

## 2024-03-26 MED ORDER — AMLODIPINE BESYLATE 5 MG PO TABS: 7.5000 mg | ORAL_TABLET | Freq: Every day | ORAL | 3 refills | Status: AC

## 2024-03-26 MED ORDER — ROSUVASTATIN CALCIUM 10 MG PO TABS
10.0000 mg | ORAL_TABLET | Freq: Every day | ORAL | 3 refills | Status: AC
Start: 2024-03-26 — End: ?

## 2024-03-26 NOTE — Patient Instructions (Addendum)
 Medication Instructions:  Continue current medication *If you need a refill on your cardiac medications before your next appointment, please call your pharmacy*  Lab Work: Bmet lipid If you have labs (blood work) drawn today and your tests are completely normal, you will receive your results only by: MyChart Message (if you have MyChart) OR A paper copy in the mail If you have any lab test that is abnormal or we need to change your treatment, we will call you to review the results.  Testing/Procedures:   Follow-Up: At [facility], you and your health needs are our priority.  As part of our continuing mission to provide you with exceptional heart care, our providers are all part of one team.  This team includes your primary Cardiologist (physician) and Advanced Practice Providers or APPs (Physician Assistants and Nurse Practitioners) who all work together to provide you with the care you need, when you need it.  Your next appointment:   6 month(s)  Provider:   Lonni LITTIE Nanas, MD    We recommend signing up for the patient portal called MyChart.  Sign up information is provided on this After Visit Summary.  MyChart is used to connect with patients for Virtual Visits (Telemedicine).  Patients are able to view lab/test results, encounter notes, upcoming appointments, etc.  Non-urgent messages can be sent to your provider as well.   To learn more about what you can do with MyChart, go to [email or web address].   Other Instructions none

## 2024-03-27 ENCOUNTER — Ambulatory Visit: Payer: Self-pay | Admitting: Cardiology

## 2024-03-27 LAB — BASIC METABOLIC PANEL WITH GFR
BUN/Creatinine Ratio: 24 (ref 12–28)
BUN: 16 mg/dL (ref 8–27)
CO2: 24 mmol/L (ref 20–29)
Calcium: 9.9 mg/dL (ref 8.7–10.3)
Chloride: 98 mmol/L (ref 96–106)
Creatinine, Ser: 0.68 mg/dL (ref 0.57–1.00)
Glucose: 85 mg/dL (ref 70–99)
Potassium: 4.2 mmol/L (ref 3.5–5.2)
Sodium: 136 mmol/L (ref 134–144)
eGFR: 84 mL/min/1.73 (ref >59)

## 2024-03-27 LAB — LIPID PANEL
Cholesterol, Total: 138 mg/dL (ref 100–199)
HDL: 66 mg/dL (ref >39)
LDL CALC COMMENT:: 2.1 ratio (ref 0.0–4.4)
LDL Chol Calc (NIH): 54 mg/dL (ref 0–99)
Triglycerides: 96 mg/dL (ref 0–149)
VLDL Cholesterol Cal: 18 mg/dL (ref 5–40)

## 2024-04-01 ENCOUNTER — Encounter: Payer: Self-pay | Admitting: Internal Medicine

## 2024-04-02 ENCOUNTER — Other Ambulatory Visit: Payer: Self-pay

## 2024-04-02 DIAGNOSIS — M810 Age-related osteoporosis without current pathological fracture: Secondary | ICD-10-CM

## 2024-04-03 ENCOUNTER — Ambulatory Visit (INDEPENDENT_AMBULATORY_CARE_PROVIDER_SITE_OTHER)
Admission: RE | Admit: 2024-04-03 | Discharge: 2024-04-03 | Disposition: A | Discharge status: Home / Self Care | Source: Ambulatory Visit | Attending: Internal Medicine | Admitting: Internal Medicine

## 2024-04-03 DIAGNOSIS — M810 Age-related osteoporosis without current pathological fracture: Secondary | ICD-10-CM | POA: Diagnosis not present

## 2024-04-05 ENCOUNTER — Ambulatory Visit: Payer: Self-pay | Admitting: Internal Medicine

## 2024-04-05 DIAGNOSIS — M810 Age-related osteoporosis without current pathological fracture: Secondary | ICD-10-CM

## 2024-04-15 ENCOUNTER — Other Ambulatory Visit: Payer: Self-pay | Admitting: Internal Medicine

## 2024-04-30 ENCOUNTER — Other Ambulatory Visit: Payer: Self-pay

## 2024-04-30 DIAGNOSIS — I714 Abdominal aortic aneurysm, without rupture, unspecified: Secondary | ICD-10-CM

## 2024-05-07 ENCOUNTER — Telehealth: Payer: Self-pay

## 2024-05-07 ENCOUNTER — Other Ambulatory Visit (HOSPITAL_COMMUNITY): Payer: Self-pay

## 2024-05-07 NOTE — Telephone Encounter (Signed)
 Prolia  VOB initiated via [email or web address]  Next Prolia  inj DUE: 06/06/24

## 2024-05-07 NOTE — Telephone Encounter (Signed)
 Pt ready for scheduling for PROLIA  on or after : 06/06/24  Option# 1: Buy/Bill (Office supplied medication)  Out-of-pocket cost due at time of clinic visit: $352  Number of injection/visits approved: 2  Primary: UHC-MEDICARE Prolia  co-insurance: 20% Admin fee co-insurance: $20  Secondary: --- Prolia  co-insurance:  Admin fee co-insurance:   Medical Benefit Details: Date Benefits were checked: 05/07/24 Deductible: NO/ Coinsurance: 20%/ Admin Fee: $20  Prior Auth: APPROVED PA# [id number] Expiration Date: 12/05/23-12/04/24  # of doses approved: 2 ----------------------------------------------------------------------- Option# 2- Med Obtained from pharmacy: Prolia  is no longer preferred for pharmacy benefit. Jubbonti is now preferred. PRICING IS FOR JUBBONTI  Pharmacy benefit: Copay $555 (Paid to pharmacy) Admin Fee: $20 (Pay at clinic)  Prior Auth: N/A PA# Expiration Date:   # of doses approved:   If patient wants fill through the pharmacy benefit please send prescription to: [facility], and include estimated need by date in rx notes. Pharmacy will ship medication directly to the office.  Patient NOT eligible for Prolia  Copay Card. Copay Card can make patient's cost as little as $25. Link to apply: [email or web address]://[email or web address]  ** This summary of benefits is an estimation of the patient's out-of-pocket cost. Exact cost may very based on individual plan coverage.

## 2024-05-07 NOTE — Telephone Encounter (Signed)
 SABRA

## 2024-05-27 NOTE — Progress Notes (Unsigned)
 Office Note     CC:  follow up Requesting Provider:  Geofm Glade PARAS, MD  HPI: Renee Figueroa is a 89 y.o. ([birth year]) female who presents for surveillance follow up of aortic ulceration. She was found to have an abdominal aortic ulcerated plaque seen in the mid aorta during her pre op work up for TAVR. She has had no associated symptoms.   Today she explains that she is doing well. She has no concerns today. She denies any back or abdominal pain. 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 She remains very active. Both she and her husband of almost 70 years still live independently. They are both involved with their neighborhood community and also spend time with their two children. She does not have any discomfort in her legs on ambulation or rest. No tissue loss. She is medically managed on Aspirin  and statin. She is followed by Dr. Kate with Cardiology.   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 Neg 2013   CORONARY ATHERECTOMY  10/02/2020   CORONARY STENT INTERVENTION N/A 10/02/2020   Procedure: CORONARY STENT INTERVENTION;  Surgeon: Wonda Sharper, MD;  Location: [facility] INVASIVE CV LAB;  Service: Cardiovascular;  Laterality: N/A;   CORONARY ULTRASOUND/IVUS N/A 10/02/2020   Procedure:  Intravascular Ultrasound/IVUS;  Surgeon: Wonda Sharper, MD;  Location: [facility] INVASIVE CV LAB;  Service: Cardiovascular;  Laterality: N/A;   DILATION AND CURETTAGE OF UTERUS     age 13   EYE SURGERY     G 2 P 2     RIGHT/LEFT HEART CATH AND CORONARY ANGIOGRAPHY N/A 08/26/2020   Procedure: RIGHT/LEFT HEART CATH AND CORONARY ANGIOGRAPHY;  Surgeon: Wonda Sharper, MD;  Location: [facility] INVASIVE CV LAB;  Service: Cardiovascular;  Laterality: N/A;   TEE WITHOUT CARDIOVERSION N/A 10/20/2020   Procedure: TRANSESOPHAGEAL ECHOCARDIOGRAM (TEE);  Surgeon: Wonda Sharper, MD;  Location: [facility] OR;  Service: Open Heart Surgery;  Laterality: N/A;   TONSILLECTOMY AND ADENOIDECTOMY     TRANSCATHETER AORTIC VALVE REPLACEMENT, TRANSFEMORAL N/A 10/20/2020   Procedure: TRANSCATHETER AORTIC VALVE REPLACEMENT, TRANSFEMORAL;  Surgeon: Wonda Sharper, MD;  Location: [facility] OR;  Service: Open Heart Surgery;  Laterality: N/A;   ULTRASOUND GUIDANCE FOR VASCULAR ACCESS Bilateral 10/20/2020   Procedure: ULTRASOUND GUIDANCE FOR VASCULAR ACCESS;  Surgeon: Wonda Sharper, MD;  Location: [facility] OR;  Service: Open Heart Surgery;  Laterality: Bilateral;    Social History   Socioeconomic History   Marital status: Married    Spouse name: Bill   Number of children: 2   Years of education: Not on  file   Highest education level: 12th grade  Occupational History   Occupation: RETIRED  Tobacco Use   Smoking status: Never   Smokeless tobacco: Never  Vaping Use   Vaping status: Never Used  Substance and Sexual Activity   Alcohol use: Yes    Comment: maybe 1 drink a month   Drug use: No   Sexual activity: Not Currently  Other Topics Concern   Not on file  Social History Narrative   Lives with husband and 1 dog/2025   Social Drivers of Health   Financial Resource Strain: Low Risk  (12/04/2023)   Overall Financial Resource Strain (CARDIA)    Difficulty of Paying Living Expenses: Not very hard  Food Insecurity: No Food Insecurity  (12/04/2023)   Hunger Vital Sign    Worried About Running Out of Food in the Last Year: Never true    Ran Out of Food in the Last Year: Never true  Transportation Needs: No Transportation Needs (12/04/2023)   PRAPARE - Administrator, Civil Service (Medical): No    Lack of Transportation (Non-Medical): No  Physical Activity: Insufficiently Active (12/04/2023)   Exercise Vital Sign    Days of Exercise per Week: 3 days    Minutes of Exercise per Session: 10 min  Stress: No Stress Concern Present (12/04/2023)   [employer] of Occupational Health - Occupational Stress Questionnaire    Feeling of Stress : Only a little  Social Connections: Unknown (12/04/2023)   Social Connection and Isolation Panel    Frequency of Communication with Friends and Family: Once a week    Frequency of Social Gatherings with Friends and Family: Not on file    Attends Religious Services: More than 4 times per year    Active Member of [employer] or Organizations: Yes    Attends Banker Meetings: Not on file    Marital Status: Married  Intimate Partner Violence: Not At Risk (12/05/2023)   Humiliation, Afraid, Rape, and Kick questionnaire    Fear of Current or Ex-Partner: No    Emotionally Abused: No    Physically Abused: No    Sexually Abused: No    Family History  Problem Relation Age of Onset   Hypertension Mother    Stomach cancer Mother        died @ 40   Heart attack Father 42   Colon cancer Neg Hx    Colon polyps Neg Hx    Rectal cancer Neg Hx    Diabetes Neg Hx    Stroke Neg Hx     Current Outpatient Medications  Medication Sig Dispense Refill   acetaminophen  (TYLENOL ) 500 MG tablet Take 1,000 mg by mouth every 6 (six) hours as needed for moderate pain.     amLODipine  (NORVASC ) 5 MG tablet Take 1.5 tablets (7.5 mg total) by mouth daily. 135 tablet 3   amoxicillin  (AMOXIL ) 500 MG tablet TAKE 4 TABLETS BY MOUTH 1 HOUR PRIOR TO DENTAL PROCEDURES AND CLEANINGS 12 tablet 6    aspirin  EC 81 MG tablet Take 81 mg by mouth daily. Swallow whole.     candesartan  (ATACAND ) 32 MG tablet Take 1 tablet (32 mg total) by mouth daily. 90 tablet 3   carboxymethylcellulose (REFRESH PLUS) 0.5 % SOLN Place 1 drop into both eyes 3 (three) times daily as needed (dry eyes).     carvedilol  (COREG ) 6.25 MG tablet Take 1 tablet (6.25 mg total) by mouth 2 (two) times daily with a meal. 180  tablet 3   ezetimibe  (ZETIA ) 10 MG tablet TAKE 1 TABLET(10 MG) BY MOUTH DAILY 90 tablet 3   fluticasone  (FLONASE ) 50 MCG/ACT nasal spray Place 1 spray into both nostrils daily as needed for allergies or rhinitis.     Multiple Minerals-Vitamins (CALCIUM  & VIT D3 BONE HEALTH PO) Take 2 each by mouth daily. Chewables     Multiple Vitamins-Minerals (MULTIVITAMIN WITH MINERALS) tablet Take 2 tablets by mouth daily. Chewables     rosuvastatin  (CRESTOR ) 10 MG tablet Take 1 tablet (10 mg total) by mouth daily. 90 tablet 3   Current Facility-Administered Medications  Medication Dose Route Frequency Provider Last Rate Last Admin   [START ON 06/06/2024] denosumab  (PROLIA ) injection 60 mg  60 mg Subcutaneous Once Geofm Glade PARAS, MD        No Active Allergies   REVIEW OF SYSTEMS:  Negative unless noted in HPI [X]  denotes positive finding, [ ]  denotes negative finding Cardiac  Comments:  Chest pain or chest pressure:    Shortness of breath upon exertion:    Short of breath when lying flat:    Irregular heart rhythm:        Vascular    Pain in calf, thigh, or hip brought on by ambulation:    Pain in feet at night that wakes you up from your sleep:     Blood clot in your veins:    Leg swelling:         Pulmonary    Oxygen at home:    Productive cough:     Wheezing:         Neurologic    Sudden weakness in arms or legs:     Sudden numbness in arms or legs:     Sudden onset of difficulty speaking or slurred speech:    Temporary loss of vision in one eye:     Problems with dizziness:          Gastrointestinal    Blood in stool:     Vomited blood:         Genitourinary    Burning when urinating:     Blood in urine:        Psychiatric    Major depression:         Hematologic    Bleeding problems:    Problems with blood clotting too easily:        Skin    Rashes or ulcers:        Constitutional    Fever or chills:      PHYSICAL EXAMINATION:  Vitals:   05/29/24 1039  BP: (!) 158/79  Pulse: 80  Temp: 97.7 F (36.5 C)  TempSrc: Temporal  Weight: 100 lb 3.2 oz (45.5 kg)    General:  WDWN in NAD;very pleasant Gait: Normal HENT: WNL, normocephalic Pulmonary: normal non-labored breathing Cardiac: regular HR Abdomen: soft, NT, no masses Vascular Exam/Pulses: 2+ radial pulses, 2+ femoral pulses, 2+ DP pulses bilaterally Extremities: without ischemic changes, without Gangrene , without cellulitis; without open wounds;  Musculoskeletal: no muscle wasting or atrophy  Neurologic: A&O X 3 Psychiatric:  The pt has Normal affect.   Non-Invasive Vascular Imaging:   VAS US  AAA Duplex complete: Abdominal Aorta Findings:  +-----------+-------+----------+----------+--------+--------+--------+  Location  AP (cm)Trans (cm)PSV (cm/s)WaveformThrombusComments  +-----------+-------+----------+----------+--------+--------+--------+  Proximal  1.80   1.80      98        biphasic                  +-----------+-------+----------+----------+--------+--------+--------+  Mid       1.70   1.70      98        biphasic                  +-----------+-------+----------+----------+--------+--------+--------+  Distal    1.60   1.60      74        biphasic                  +-----------+-------+----------+----------+--------+--------+--------+  RT CIA Prox1.0    1.0       142       biphasic                  +-----------+-------+----------+----------+--------+--------+--------+  LT CIA Prox0.9    0.9       118       biphasic                   +-----------+-------+----------+----------+--------+--------+--------+   IVC/Iliac Findings:  +--------+------+--------+--------+   IVC   PatentThrombusComments  +--------+------+--------+--------+  IVC Proxpatent                  +--------+------+--------+--------+   Summary:  Abdominal Aorta: No evidence of an abdominal aortic aneurysm was visualized. The largest aortic measurement is 1.8 cm. Ulcerative plaque noted in the infrarenal mid abdominal aorta, measuring .80 cm x .80 cm. IVC/Iliac: Patent IVC.   ASSESSMENT/PLAN:: 89 y.o. female here for surveillance follow up of aortic ulceration. She was found to have an abdominal aortic ulcerated plaque seen in the mid aorta during her pre op work up for TAVR. Her duplex today shows stable aortic ulcerative plaque. No AAA. She remains without any back or abdominal pain. She does not have any claudication, rest pain or tissue loss.  - Continue Aspirin  and Statin - She knows to call for earlier follow up if she has any new or concerning symptoms - She will follow up in 1 year with repeat AAA duplex   Teretha Damme, PA-C [facility] [phone]  Clinic MD:   Sheree

## 2024-05-29 ENCOUNTER — Ambulatory Visit (HOSPITAL_COMMUNITY)
Admission: RE | Admit: 2024-05-29 | Discharge: 2024-05-29 | Disposition: A | Discharge status: Home / Self Care | Source: Ambulatory Visit | Attending: Vascular Surgery | Admitting: Vascular Surgery

## 2024-05-29 ENCOUNTER — Ambulatory Visit

## 2024-05-29 VITALS — BP 158/79 | HR 80 | Temp 97.7°F | Wt 100.2 lb

## 2024-05-29 DIAGNOSIS — I700 Atherosclerosis of aorta: Secondary | ICD-10-CM | POA: Diagnosis not present

## 2024-05-29 DIAGNOSIS — I719 Aortic aneurysm of unspecified site, without rupture: Secondary | ICD-10-CM | POA: Insufficient documentation

## 2024-05-29 DIAGNOSIS — I714 Abdominal aortic aneurysm, without rupture, unspecified: Secondary | ICD-10-CM | POA: Insufficient documentation

## 2024-05-31 ENCOUNTER — Encounter: Payer: Self-pay | Admitting: Internal Medicine

## 2024-06-05 ENCOUNTER — Telehealth: Payer: Self-pay

## 2024-06-05 NOTE — Telephone Encounter (Signed)
 Copied from CRM #[id number]. Topic: Appointments - Scheduling Inquiry for Clinic >> Jun 05, 2024  8:18 AM Renee Figueroa wrote: Reason for CRM: Patient called in regarding scheduling prolia  shot, would like for someone to give her a callback once she can scheduled , also wanted to know if her insurance still covers it

## 2024-06-07 ENCOUNTER — Ambulatory Visit

## 2024-06-07 DIAGNOSIS — M810 Age-related osteoporosis without current pathological fracture: Secondary | ICD-10-CM | POA: Diagnosis not present

## 2024-06-07 MED ORDER — DENOSUMAB 60 MG/ML ~~LOC~~ SOSY: 60.0000 mg | PREFILLED_SYRINGE | SUBCUTANEOUS | Status: AC

## 2024-06-07 NOTE — Telephone Encounter (Signed)
 Appointment scheduled.

## 2024-06-07 NOTE — Addendum Note (Signed)
 Addended by: EZZARD EDSEL HERO on: 06/07/2024 02:03 PM   Modules accepted: Orders

## 2024-06-07 NOTE — Progress Notes (Signed)
 After obtaining consent, and per orders of Dr. Geofm, injection of Prolia  given by Edsel CHRISTELLA Kerns. Pt tolerated well.

## 2024-09-18 ENCOUNTER — Ambulatory Visit: Admitting: Cardiology

## 2024-12-16 ENCOUNTER — Encounter: Admitting: Internal Medicine

## 2024-12-31 ENCOUNTER — Ambulatory Visit: Admitting: Cardiology
